# Patient Record
Sex: Female | Born: 1955 | Race: White | Hispanic: Refuse to answer | Marital: Married | Smoking: Former smoker
Health system: Southern US, Community
[De-identification: ages and names within clinical notes are randomized; demographics above are authoritative.]

## PROBLEM LIST (undated history)

## (undated) DIAGNOSIS — M1990 Unspecified osteoarthritis, unspecified site: Secondary | ICD-10-CM

## (undated) DIAGNOSIS — R7303 Prediabetes: Secondary | ICD-10-CM

## (undated) DIAGNOSIS — I10 Essential (primary) hypertension: Secondary | ICD-10-CM

## (undated) DIAGNOSIS — R519 Headache, unspecified: Secondary | ICD-10-CM

## (undated) DIAGNOSIS — L570 Actinic keratosis: Secondary | ICD-10-CM

## (undated) DIAGNOSIS — D649 Anemia, unspecified: Secondary | ICD-10-CM

## (undated) DIAGNOSIS — M1711 Unilateral primary osteoarthritis, right knee: Secondary | ICD-10-CM

## (undated) DIAGNOSIS — E785 Hyperlipidemia, unspecified: Secondary | ICD-10-CM

## (undated) HISTORY — PX: TMJ ARTHROPLASTY: SHX1066

## (undated) HISTORY — DX: Actinic keratosis: L57.0

## (undated) HISTORY — PX: COLONOSCOPY W/ BIOPSIES AND POLYPECTOMY: SHX1376

## (undated) HISTORY — PX: COLONOSCOPY: SHX174

## (undated) HISTORY — PX: TONSILLECTOMY: SUR1361

## (undated) HISTORY — PX: TUBAL LIGATION: SHX77

## (undated) HISTORY — PX: ROTATOR CUFF REPAIR: SHX139

---

## 1999-11-13 LAB — HM MAMMOGRAPHY

## 2016-02-02 LAB — HM COLONOSCOPY

## 2016-09-25 LAB — HM MAMMOGRAPHY

## 2017-08-26 ENCOUNTER — Encounter: Payer: Self-pay | Admitting: Family Medicine

## 2017-08-26 ENCOUNTER — Ambulatory Visit (INDEPENDENT_AMBULATORY_CARE_PROVIDER_SITE_OTHER): Payer: BLUE CROSS/BLUE SHIELD | Admitting: Family Medicine

## 2017-08-26 VITALS — BP 126/82 | HR 92 | Temp 98.2°F | Resp 16 | Ht 61.0 in | Wt 180.0 lb

## 2017-08-26 DIAGNOSIS — G35 Multiple sclerosis: Secondary | ICD-10-CM | POA: Insufficient documentation

## 2017-08-26 DIAGNOSIS — J309 Allergic rhinitis, unspecified: Secondary | ICD-10-CM | POA: Insufficient documentation

## 2017-08-26 DIAGNOSIS — M7711 Lateral epicondylitis, right elbow: Secondary | ICD-10-CM | POA: Diagnosis not present

## 2017-08-26 DIAGNOSIS — M1990 Unspecified osteoarthritis, unspecified site: Secondary | ICD-10-CM | POA: Insufficient documentation

## 2017-08-26 DIAGNOSIS — Z23 Encounter for immunization: Secondary | ICD-10-CM | POA: Diagnosis not present

## 2017-08-26 DIAGNOSIS — I10 Essential (primary) hypertension: Secondary | ICD-10-CM | POA: Insufficient documentation

## 2017-08-26 DIAGNOSIS — E785 Hyperlipidemia, unspecified: Secondary | ICD-10-CM | POA: Insufficient documentation

## 2017-08-26 MED ORDER — NAPROXEN 500 MG PO TABS: 500.0000 mg | ORAL_TABLET | Freq: Two times a day (BID) | ORAL | 0 refills | Status: DC

## 2017-08-26 NOTE — Patient Instructions (Signed)
Lateral epicondylitis   Tennis elbow (lateral epicondylitis) is inflammation of the outer tendons of your forearm close to your elbow. Your tendons attach your muscles to your bones. The outer tendons of your forearm are used to extend your wrist, and they attach on the outside part of your elbow. Tennis elbow is often found in people who play tennis, but anyone may get the condition from repeatedly extending the wrist or turning the forearm. What are the causes? This condition is caused by repeatedly extending your wrist and using your hands. It can result from sports or work that requires repetitive forearm movements. Tennis elbow may also be caused by an injury. What increases the risk? You have a higher risk of developing tennis elbow if you play tennis or another racquet sport. You also have a higher risk if you frequently use your hands for work. This condition is also more likely to develop in:  Musicians.  Carpenters, painters, and plumbers.  Cooks.  Cashiers.  People who work in [employer].  Architect workers.  Butchers.  People who use computers.  What are the signs or symptoms? Symptoms of this condition include:  Pain and tenderness in your forearm and the outer part of your elbow. You may only feel the pain when you use your arm, or you may feel it even when you are not using your arm.  A burning feeling that runs from your elbow through your arm.  Weak grip in your hands.  How is this diagnosed? This condition may be diagnosed by medical history and physical exam. You may also have other tests, including:  X-rays.  MRI.  How is this treated? Your health care provider will recommend lifestyle adjustments, such as resting and icing your arm. Treatment may also include:  Medicines for inflammation. This may include shots of cortisone if your pain continues.  Physical therapy. This may include massage or exercises.  An elbow brace.  Surgery may eventually  be recommended if your pain does not go away with treatment. Follow these instructions at home: Activity  Rest your elbow and wrist as directed by your health care provider. Try to avoid any activities that caused the problem until your health care provider says that you can do them again.  If a physical therapist teaches you exercises, do all of them as directed.  If you lift an object, lift it with your palm facing upward. This lowers the stress on your elbow. Lifestyle  If your tennis elbow is caused by sports, check your equipment and make sure that: ? You are using it correctly. ? It is the best fit for you.  If your tennis elbow is caused by work, take breaks frequently, if you are able. Talk with your manager about how to best perform tasks in a way that is safe. ? If your tennis elbow is caused by computer use, talk with your manager about any changes that can be made to your work environment. General instructions  If directed, apply ice to the painful area: ? Put ice in a plastic bag. ? Place a towel between your skin and the bag. ? Leave the ice on for 20 minutes, 2-3 times per day.  Take medicines only as directed by your health care provider.  If you were given a brace, wear it as directed by your health care provider.  Keep all follow-up visits as directed by your health care provider. This is important. Contact a health care provider if:  Your pain  does not get better with treatment.  Your pain gets worse.  You have numbness or weakness in your forearm, hand, or fingers. This information is not intended to replace advice given to you by your health care provider. Make sure you discuss any questions you have with your health care provider. Document Released: 10/29/2005 Document Revised: 06/28/2016 Document Reviewed: 10/25/2014 Elsevier Interactive Patient Education  [employer].

## 2017-08-26 NOTE — Progress Notes (Signed)
Patient: Stephanie Shah Female    DOB: [birth year]   61 y.o.   MRN: [id number] Visit Date: 08/26/2017  Today's Provider: Lelon Huh, MD   Chief Complaint  Patient presents with  . New Patient (Initial Visit)  . Elbow Injury   Subjective:    HPI Patient comes in today establishing care with the practice. She just moved from [place] about 2 months ago.  Elbow pain: Patient reports that she developed pain in her right elbow about 1 month ago. She thinks she may have injured it while moving and unpacking boxes. She has not taken anything OTC for her symptoms. She denies any swelling, but she does have tenderness. She has also used heat and ice to help decrease the pain with minimal relief.    Allergies  Allergen Reactions  . Penicillins Rash     Current Outpatient Prescriptions:  .  aspirin EC 81 MG tablet, Take 81 mg by mouth daily., Disp: , Rfl:  .  atorvastatin (LIPITOR) 20 MG tablet, Take 20 mg by mouth daily., Disp: , Rfl:  .  butalbital-acetaminophen-caffeine (FIORICET, ESGIC) 50-325-40 MG tablet, Take by mouth 2 (two) times daily as needed for headache., Disp: , Rfl:  .  hydrochlorothiazide (MICROZIDE) 12.5 MG capsule, Take 12.5 mg by mouth daily., Disp: , Rfl:  .  lisinopril (PRINIVIL,ZESTRIL) 40 MG tablet, Take 40 mg by mouth daily., Disp: , Rfl:  .  Multiple Vitamin (MULTIVITAMIN) tablet, Take 1 tablet by mouth daily., Disp: , Rfl:   Review of Systems  Musculoskeletal: Positive for arthralgias and myalgias. Negative for joint swelling.  Skin: Negative for color change, pallor and rash.  Neurological: Positive for weakness. Negative for numbness.  Hematological: Does not bruise/bleed easily.    Social History  Substance Use Topics  . Smoking status: Former Smoker    Types: Cigarettes    Quit date: 11/13/2003  . Smokeless tobacco: Never Used  . Alcohol use 1.8 oz/week    3 Glasses of wine per week   Objective:   BP 126/82   Pulse 92   Temp 98.2 F  (36.8 C)   Resp 16   Ht 5\' 1"  (1.549 m)   Wt 180 lb (81.6 kg)   SpO2 98%   BMI 34.01 kg/m  Vitals:   08/26/17 1510  BP: 126/82  Pulse: 92  Resp: 16  Temp: 98.2 F (36.8 C)  SpO2: 98%  Weight: 180 lb (81.6 kg)  Height: 5\' 1"  (1.549 m)     Physical Exam  General appearance: alert, well developed, well nourished, cooperative and in no distress Head: Normocephalic, without obvious abnormality, atraumatic Respiratory: Respirations even and unlabored, normal respiratory rate Extremities: Point tenderness right lateral epicondyle. Pain with with pronation and supination against resistance. No erythema, swelling, or other gross deformities.       Assessment & Plan:     1. Lateral epicondylitis of right elbow Discussed conservative treatments including relative rest, elastic forearm brace, regular icing and NSAID - naproxen (NAPROSYN) 500 MG tablet; Take 1 tablet (500 mg total) by mouth 2 (two) times daily with a meal.  Dispense: 30 tablet; Refill: 0  Discussed option of steroid injection or OT if not much improved in 1-2 weeks.   2. Influenza vaccine needed  - Flu Vaccine QUAD 6+ mos PF IM (Fluarix Quad PF)  Had patient complete forms for records release from previous PCP.       Lelon Huh, MD  [facility]

## 2017-09-12 ENCOUNTER — Other Ambulatory Visit: Payer: Self-pay | Admitting: Family Medicine

## 2017-09-12 DIAGNOSIS — M7711 Lateral epicondylitis, right elbow: Secondary | ICD-10-CM

## 2017-09-19 ENCOUNTER — Encounter: Payer: Self-pay | Admitting: Family Medicine

## 2017-09-19 DIAGNOSIS — G4486 Cervicogenic headache: Secondary | ICD-10-CM | POA: Insufficient documentation

## 2017-09-19 DIAGNOSIS — R51 Headache: Secondary | ICD-10-CM

## 2017-10-28 ENCOUNTER — Encounter: Payer: Self-pay | Admitting: Family Medicine

## 2017-10-28 ENCOUNTER — Ambulatory Visit: Payer: BLUE CROSS/BLUE SHIELD | Admitting: Family Medicine

## 2017-10-28 VITALS — BP 110/74 | HR 90 | Temp 98.1°F | Resp 16 | Wt 181.0 lb

## 2017-10-28 DIAGNOSIS — M7711 Lateral epicondylitis, right elbow: Secondary | ICD-10-CM | POA: Diagnosis not present

## 2017-10-28 NOTE — Progress Notes (Signed)
Patient: Stephanie Shah Female    DOB: September 29, 1956   61 y.o.   MRN: [id number] Visit Date: 10/28/2017  Today's Provider: Lelon Huh, MD   Chief Complaint  Patient presents with  . Follow-up   Subjective:    HPI  Lateral epicondylitis of right elbow From 08/26/2017-Discussed conservative treatments including relative rest, elastic forearm brace, regular icing and NSAID Given rx for naproxen (NAPROSYN) 500 MG tablet; Take 1 tablet (500 mg total) by mouth 2 (two) times daily with a meal.Discussed option of steroid injection or OT if not much improved in 1-2 weeks.  Symptoms started after moving at the end of the summer. Since October visit, shet states her left elbow feels is much less painful than before, but has not resolved. Still sore to touch of epicondyle and some pain when lifting objects. She did wear elbow band for a few weeks which helped somewhat.   Allergies  Allergen Reactions  . Penicillins Rash     Current Outpatient Medications:  .  aspirin EC 81 MG tablet, Take 81 mg by mouth daily., Disp: , Rfl:  .  atorvastatin (LIPITOR) 20 MG tablet, Take 20 mg by mouth daily., Disp: , Rfl:  .  butalbital-acetaminophen-caffeine (FIORICET, ESGIC) 50-325-40 MG tablet, Take by mouth 2 (two) times daily as needed for headache., Disp: , Rfl:  .  hydrochlorothiazide (MICROZIDE) 12.5 MG capsule, Take 12.5 mg by mouth daily., Disp: , Rfl:  .  lisinopril (PRINIVIL,ZESTRIL) 40 MG tablet, Take 40 mg by mouth daily., Disp: , Rfl:  .  Multiple Vitamin (MULTIVITAMIN) tablet, Take 1 tablet by mouth daily., Disp: , Rfl:   Review of Systems  Constitutional: Negative for appetite change, chills, fatigue and fever.  Respiratory: Negative for chest tightness and shortness of breath.   Cardiovascular: Negative for chest pain and palpitations.  Gastrointestinal: Negative for abdominal pain, nausea and vomiting.  Musculoskeletal: Positive for arthralgias.  Neurological: Negative for  dizziness and weakness.    Social History   Tobacco Use  . Smoking status: Former Smoker    Types: Cigarettes    Last attempt to quit: 11/13/2003    Years since quitting: 13.9  . Smokeless tobacco: Never Used  Substance Use Topics  . Alcohol use: Yes    Alcohol/week: 1.8 oz    Types: 3 Glasses of wine per week   Objective:   BP 110/74 (BP Location: Right Arm, Patient Position: Sitting, Cuff Size: Normal)   Pulse 90   Temp 98.1 F (36.7 C) (Oral)   Resp 16   Wt 181 lb (82.1 kg)   SpO2 95%   BMI 34.20 kg/m  Vitals:   10/28/17 1642  BP: 110/74  Pulse: 90  Resp: 16  Temp: 98.1 F (36.7 C)  TempSrc: Oral  SpO2: 95%  Weight: 181 lb (82.1 kg)     Physical Exam  General appearance: alert, well developed, well nourished, cooperative and in no distress Head: Normocephalic, without obvious abnormality, atraumatic Respiratory: Respirations even and unlabored, normal respiratory rate Extremities: Slight point tenderness right lateral epicondyle. Pain with with pronation and supination against resistance. No erythema, swelling, or other gross deformities.      Assessment & Plan:     1. Lateral epicondylitis of right elbow Significantly improved, but not resolved after 2 months of NSAID.  - Ambulatory referral to Occupational Therapy  The entirety of the information documented in the History of Present Illness, Review of Systems and Physical Exam were personally obtained by  me. Portions of this information were initially documented by April M. Sabra Heck, CMA and reviewed by me for thoroughness and accuracy.        Lelon Huh, MD  [facility]

## 2017-11-29 ENCOUNTER — Telehealth: Payer: Self-pay | Admitting: Family Medicine

## 2017-11-29 DIAGNOSIS — M25561 Pain in right knee: Secondary | ICD-10-CM

## 2017-11-29 NOTE — Telephone Encounter (Signed)
Ok to place ortho referral? Please advise. Thanks!

## 2017-11-29 NOTE — Telephone Encounter (Signed)
Patient recently established as pt. Here from [place] and she states her R knee is hurting again.   She had It injected in [place] by an orthopedic surgeon with [facility] and is looking to get a referral to an Ortho here that would maybe inject it again.

## 2017-12-02 NOTE — Telephone Encounter (Signed)
Ok, please order orthopedics referral for knee pain

## 2017-12-02 NOTE — Telephone Encounter (Signed)
Please schedule referral to orthopedics? Thanks!

## 2017-12-04 NOTE — Telephone Encounter (Signed)
Pt set up appointment with Vance Peper 12/12/17 at 9:00

## 2017-12-10 ENCOUNTER — Ambulatory Visit: Payer: BLUE CROSS/BLUE SHIELD | Attending: Family Medicine | Admitting: Occupational Therapy

## 2017-12-10 ENCOUNTER — Other Ambulatory Visit: Payer: Self-pay

## 2017-12-10 ENCOUNTER — Encounter: Payer: Self-pay | Admitting: Occupational Therapy

## 2017-12-10 DIAGNOSIS — M25632 Stiffness of left wrist, not elsewhere classified: Secondary | ICD-10-CM

## 2017-12-10 DIAGNOSIS — M6281 Muscle weakness (generalized): Secondary | ICD-10-CM | POA: Diagnosis present

## 2017-12-10 DIAGNOSIS — M25521 Pain in right elbow: Secondary | ICD-10-CM | POA: Insufficient documentation

## 2017-12-10 DIAGNOSIS — M7711 Lateral epicondylitis, right elbow: Secondary | ICD-10-CM | POA: Diagnosis not present

## 2017-12-10 NOTE — Therapy (Signed)
[facility] [address] [place], [address] Phone: [phone]   Fax:  [phone]  Occupational Therapy Evaluation  Patient Details  Name: Stephanie Shah MRN: [id number] Date of Birth: April 17, 1956 Referring Provider: Dr Caryn Section   Encounter Date: 12/10/2017  OT End of Session - 12/10/17 1138    Visit Number  1    Number of Visits  6    Date for OT Re-Evaluation  01/21/18    OT Start Time  1017    OT Stop Time  1114    OT Time Calculation (min)  57 min    Activity Tolerance  Patient tolerated treatment well    Behavior During Therapy  [facility] for tasks assessed/performed       History reviewed. No pertinent past medical history.  Past Surgical History:  Procedure Laterality Date  . CESAREAN SECTION    . ROTATOR CUFF REPAIR Right   . TMJ ARTHROPLASTY    . TUBAL LIGATION      There were no vitals filed for this visit.  Subjective Assessment - 12/10/17 1128    Subjective   We moved last year in Oct from [place] to [place] and packed  up  a 4 BR and 2 story house Aug and Sept - that is when is started - it is better than it was in Nov and Dec - but still pain can fluctuate between 1-4/10 -     Patient Stated Goals  I want my arm better that I can use it with no pain - like pulling and lifting     Currently in Pain?  Yes    Pain Score  --  1-4 dependending what I do     Pain Orientation  Right    Pain Descriptors / Indicators  Stabbing    Pain Onset  More than a month ago        [facility] OT Assessment - 12/10/17 0001      Assessment   Medical Diagnosis  R lat epicondylitis    Referring Provider  Dr Caryn Section    Onset Date/Surgical Date  07/31/17    Hand Dominance  Left      Precautions   Required Braces or Orthoses  -- Tried counter force brace but did not had it on correct [place] With  Spouse      Prior Function   Vocation  Retired    Leisure  Likes to travel, on computer,  read,       Palpation   Palpation comment  Tender over lat epicondyle       Strength   Right Hand Grip (lbs)  50 Extended arm 30    Left Hand Grip (lbs)  48 extended arm 40        Heat done on pt's elbow  [employer]aston tool nr 2 use for brushing and sweeping over and around lateral epicondyle Cross friction massage over lateral epincondyle  Ed on doing at home  Stretch wrist flexion and loose fist - elbow to side - in neutral Then extended arm -loose fist - in neutral  Then extended arm - loose fist- in pronation  5 reps gentle each   Ice or ice massage  Done and to do at home  2-3 x day  Pt ed on using counter force brace correctly  And modifications - pick up with palm or carry with palms up or  on forearms                 OT Education - 12/10/17 1138    Education provided  Yes    Education Details  Findings and HEP     Person(s) Educated  Patient    Methods  Explanation;Demonstration;Tactile cues;Verbal cues;Handout    Comprehension  Verbal cues required;Returned demonstration;Verbalized understanding       OT Short Term Goals - 12/10/17 1146      OT SHORT TERM GOAL #1   Title  Pt to be independent in HEP to decrease pain to less than 2/10 at the worse using at home     Baseline  tender at lateral epicondyle - and pain 4/10     Time  3    Period  Weeks    Status  New    Target Date  12/31/17        OT Long Term Goals - 12/10/17 1148      OT LONG TERM GOAL #1   Title  Pt to be negative for signs of lateral epicondyle - tenderness, pain with resistance to 3rd digit and wrist extention and increase grip with extended arm     Baseline  tenderness 4/10 and grip decrease for extended arm from 50lbs to 30 lbs     Time  4    Period  Weeks    Status  New    Target Date  01/07/18      OT LONG TERM GOAL #2   Title  Quickdash score improve with more than 20 points     Baseline  at eval score on Quickdash 40.9    Time  6    Period  Weeks    Status  New     Target Date  01/21/18            Plan - 12/10/17 1139    Clinical Impression Statement  Pt present at OT evaluation with diagnosis of Lat epicondylitis - pt still tender at lateral epicondyle - no pain with resistance to extention of wrist or 3rd digit- pt decrease grip significantly with extended arm - and tightness or stretch with  forearm extensors stretch -  pt ed on HEP - and ed on using counter force splint correctly - pain , decrease strength limiting her functional use of  R hand in ADL's and IADL's     Occupational performance deficits (Please refer to evaluation for details):  ADL's;IADL's;Play;Leisure    Rehab Potential  Good    OT Frequency  1x / week    OT Duration  6 weeks    OT Treatment/Interventions  Self-care/ADL training;Moist Heat;Splinting;Ultrasound;Iontophoresis;Cryotherapy;Manual Therapy;Passive range of motion;Therapeutic exercise    Plan  Assess progress with homeprogram and upgrade as needed     Clinical Decision Making  Several treatment options, min-mod task modification necessary    OT Home Exercise Plan  see pt instruction    Consulted and Agree with Plan of Care  Patient       Patient will benefit from skilled therapeutic intervention in order to improve the following deficits and impairments:  Pain, Impaired flexibility, Impaired UE functional use, Decreased knowledge of precautions, Decreased range of motion, Decreased strength  Visit Diagnosis: Lateral epicondylitis of right elbow - Plan: Ot plan of care cert/re-cert  Pain in right elbow - Plan: Ot plan of care cert/re-cert  Stiffness of left wrist, not elsewhere classified - Plan: Ot plan of care cert/re-cert  Muscle weakness (generalized) -  Plan: Ot plan of care cert/re-cert    Problem List Patient Active Problem List   Diagnosis Date Noted  . Cervicogenic headache 09/19/2017  . Allergic rhinitis 08/26/2017  . Arthritis 08/26/2017  . Hyperlipidemia 08/26/2017  . Hypertension 08/26/2017   . MS (multiple sclerosis) ([place]) 08/26/2017    Rosalyn Gess OTR/L,CLT 12/10/2017, 11:56 AM  [facility] [address] [place], [address] Phone: [phone]   Fax:  [phone]  Name: Creedence Heiss MRN: [id number] Date of Birth: May 08, 1956

## 2017-12-10 NOTE — Patient Instructions (Signed)
Heat  Cross friction massage over lateral epincondyle  Stretch wrist flexion and loose fist - elbow to side - in neutral Then extended arm -loose fist - in neutral  Then extended arm - loose fist- in pronation  5 reps gentle each   Ice or ice massage  Pt ed on using counter force brace correctly  And modifications - pick up with palm or carry with palms up or on forearms

## 2017-12-11 ENCOUNTER — Telehealth: Payer: Self-pay | Admitting: Family Medicine

## 2017-12-11 MED ORDER — FLUOXETINE HCL 40 MG PO CAPS: 40.0000 mg | ORAL_CAPSULE | Freq: Every day | ORAL | 3 refills | Status: DC

## 2017-12-11 NOTE — Telephone Encounter (Signed)
Patient is calling requesting a refill on the following medication  Fluoxetine 40mg , take one capsule by mouth every day. 90 day supply   She states that she moved here and was on this medication but her refills have expired.  She uses [facility].

## 2017-12-11 NOTE — Telephone Encounter (Signed)
Please review. Ok to refill?  

## 2017-12-17 ENCOUNTER — Ambulatory Visit: Payer: BLUE CROSS/BLUE SHIELD | Attending: Family Medicine | Admitting: Occupational Therapy

## 2017-12-17 DIAGNOSIS — M25632 Stiffness of left wrist, not elsewhere classified: Secondary | ICD-10-CM | POA: Insufficient documentation

## 2017-12-17 DIAGNOSIS — M25521 Pain in right elbow: Secondary | ICD-10-CM | POA: Insufficient documentation

## 2017-12-17 DIAGNOSIS — M6281 Muscle weakness (generalized): Secondary | ICD-10-CM | POA: Diagnosis present

## 2017-12-17 DIAGNOSIS — M7711 Lateral epicondylitis, right elbow: Secondary | ICD-10-CM | POA: Insufficient documentation

## 2017-12-17 NOTE — Therapy (Signed)
[facility] [address], [place], [address] Phone: [phone]   Fax:  [phone]  Occupational Therapy Treatment  Patient Details  Name: Stephanie Shah MRN: [id number] Date of Birth: 1956-05-17 Referring Provider: Dr Caryn Section   Encounter Date: 12/17/2017  OT End of Session - 12/17/17 1715    Visit Number  2    Number of Visits  6    Date for OT Re-Evaluation  01/21/18    OT Start Time  1645    OT Stop Time  1713    OT Time Calculation (min)  28 min    Activity Tolerance  Patient tolerated treatment well    Behavior During Therapy  [facility] for tasks assessed/performed       No past medical history on file.  Past Surgical History:  Procedure Laterality Date  . CESAREAN SECTION    . ROTATOR CUFF REPAIR Right   . TMJ ARTHROPLASTY    . TUBAL LIGATION      There were no vitals filed for this visit.  Subjective Assessment - 12/17/17 1656    Subjective   It is better , pain is between 0-2/10 - exercises did okay - do not feel pull - did the counter force splint with functional use     Patient Stated Goals  I want my arm better that I can use it with no pain - like pulling and lifting     Currently in Pain?  Yes         [facility] OT Assessment - 12/17/17 0001      Strength   Right Hand Grip (lbs)  52 42 extended arm     Left Hand Grip (lbs)  48 46 extender arm          Assess AROM for  wrist flexion and loose fist - elbow to side - in neutral Then extended arm -loose fist - in neutral  Then extended arm - loose fist- in pronation  5 reps gentle each   no pull  Change to       Extended arm to forearm  extensor stretch 5 reps hold 5 sec - in neutral and pronation position  Cont with heat, massage , counter  Force splint and ice massage   Heat was done at [facility]  Then [employer] nr 2 for sweeping and brushing over extensor mass and lateral epicondyle - still tender but better than last time  Review  upgrade of HEP   See above    OT Treatments/Exercises (OP) - 12/17/17 0001      Moist Heat Therapy   Number Minutes Moist Heat  5 Minutes    Moist Heat Location  Elbow prior to manual therapy      Cryotherapy   Number Minutes Cryotherapy  3 Minutes    Cryotherapy Location  -- elbow    Type of Cryotherapy  Ice massage      Reinforce for pt to still modify act how she pick up or move objects        OT Education - 12/17/17 1714    Education provided  Yes    Education Details  changes to HEP stretches    Person(s) Educated  Patient    Methods  Explanation;Demonstration;Tactile cues;Verbal cues    Comprehension  Returned demonstration       OT Short Term Goals - 12/10/17 1146      OT SHORT TERM GOAL #1   Title  Pt to be independent in HEP to decrease pain to less than 2/10 at the worse using at home     Baseline  tender at lateral epicondyle - and pain 4/10     Time  3    Period  Weeks    Status  New    Target Date  12/31/17        OT Long Term Goals - 12/10/17 1148      OT LONG TERM GOAL #1   Title  Pt to be negative for signs of lateral epicondyle - tenderness, pain with resistance to 3rd digit and wrist extention and increase grip with extended arm     Baseline  tenderness 4/10 and grip decrease for extended arm from 50lbs to 30 lbs     Time  4    Period  Weeks    Status  New    Target Date  01/07/18      OT LONG TERM GOAL #2   Title  Quickdash score improve with more than 20 points     Baseline  at eval score on Quickdash 40.9    Time  6    Period  Weeks    Status  New    Target Date  01/21/18            Plan - 12/17/17 1715    Clinical Impression Statement  Pt this date report pain decrease from 4/10 at the worse to 2/10 - did wear the counter force splint - and less tender - but still - change HEP to stretch - pt did show increase grip strength in R hand with extended arm by 12 lbs - pt to cont with HEP this time 2 wks before returning      Occupational performance deficits (Please refer to evaluation for details):  ADL's;IADL's;Play;Leisure    Rehab Potential  Good    OT Frequency  Biweekly    OT Duration  4 weeks    OT Treatment/Interventions  Self-care/ADL training;Moist Heat;Splinting;Ultrasound;Iontophoresis;Cryotherapy;Manual Therapy;Passive range of motion;Therapeutic exercise    Plan  assess progress with HEP and upgrade HEP as needed     Clinical Decision Making  Several treatment options, min-mod task modification necessary    OT Home Exercise Plan  see pt instruction    Consulted and Agree with Plan of Care  Patient       Patient will benefit from skilled therapeutic intervention in order to improve the following deficits and impairments:  Pain, Impaired flexibility, Impaired UE functional use, Decreased knowledge of precautions, Decreased range of motion, Decreased strength  Visit Diagnosis: Lateral epicondylitis of right elbow  Pain in right elbow  Stiffness of left wrist, not elsewhere classified  Muscle weakness (generalized)    Problem List Patient Active Problem List   Diagnosis Date Noted  . Cervicogenic headache 09/19/2017  . Allergic rhinitis 08/26/2017  . Arthritis 08/26/2017  . Hyperlipidemia 08/26/2017  . Hypertension 08/26/2017  . MS (multiple sclerosis) ([place]) 08/26/2017    Rosalyn Gess OTR/L,CLT 12/17/2017, 5:17 PM  [place] [facility] [address], [place], [address] Phone: [phone]   Fax:  [phone]  Name: Shelly Spenser MRN: [id number] Date of Birth: [birth year]

## 2017-12-17 NOTE — Patient Instructions (Signed)
Change her  Extended arm to forearm  extensor stretch 5 reps hold 5 sec - in neutral and pronation position  Cont with heat, massage , counter  Force splint and ice massage

## 2017-12-31 ENCOUNTER — Ambulatory Visit: Payer: BLUE CROSS/BLUE SHIELD | Admitting: Occupational Therapy

## 2017-12-31 DIAGNOSIS — M7711 Lateral epicondylitis, right elbow: Secondary | ICD-10-CM

## 2017-12-31 DIAGNOSIS — M6281 Muscle weakness (generalized): Secondary | ICD-10-CM

## 2017-12-31 DIAGNOSIS — M25632 Stiffness of left wrist, not elsewhere classified: Secondary | ICD-10-CM

## 2017-12-31 DIAGNOSIS — M25521 Pain in right elbow: Secondary | ICD-10-CM

## 2017-12-31 NOTE — Therapy (Signed)
[facility] [address] [place], [address] Phone: [phone]   Fax:  [phone]  Occupational Therapy Treatment  Patient Details  Name: Stephanie Shah MRN: [id number] Date of Birth: 08/27/1956 Referring Provider: Dr Caryn Section   Encounter Date: 12/31/2017  OT End of Session - 12/31/17 1359    Visit Number  3    Number of Visits  6    Date for OT Re-Evaluation  01/21/18    OT Start Time  1130    OT Stop Time  1200    OT Time Calculation (min)  30 min    Activity Tolerance  Patient tolerated treatment well    Behavior During Therapy  [facility] for tasks assessed/performed       No past medical history on file.  Past Surgical History:  Procedure Laterality Date  . CESAREAN SECTION    . ROTATOR CUFF REPAIR Right   . TMJ ARTHROPLASTY    . TUBAL LIGATION      There were no vitals filed for this visit.  Subjective Assessment - 12/31/17 1133    Subjective   Much better and can go day or 2 without even thinking of my arm - I even forgot to do my execises because it do not hurt and even cannot find tender spot at times     Patient Stated Goals  I want my arm better that I can use it with no pain - like pulling and lifting     Currently in Pain?  No/denies         [facility] OT Assessment - 12/31/17 0001      Strength   Right Hand Grip (lbs)  -- 50 extended arm     Left Hand Grip (lbs)  -- extended arm 53         Assess AROM for  wrist flexion and extention with loose fist - elbow to side and in extention   no tightness and ROM WNL Wrist strength in all planes and elbow in all planes 5/5 - and no pain    Graston tool nr 2 for sweeping and brushing over extensor mass and lateral epicondyle - still tender but less than 1/10  And just over lat epicondyle  Pt to cont with HEP for few wks and gradually stop   not tightness over tenderness over extensor mass of R forearm  Review HEP for possible dicharge and   pt  to cont with modifications to decrease risk for developing it again- 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                OT Education - 12/31/17 1358    Education provided  Yes    Education Details  discharge instructions    Person(s) Educated  Patient    Methods  Explanation;Demonstration    Comprehension  Returned demonstration       OT Short Term Goals - 12/31/17 1402      OT SHORT TERM GOAL #1   Title  Pt to be independent in HEP to decrease pain to less than 2/10 at the worse using at home     Baseline  no pain and independent in HEP     Status  Achieved        OT Long Term Goals - 12/31/17 1402      OT LONG TERM GOAL #1   Title  Pt to be negative for signs  of lateral epicondyle - tenderness, pain with resistance to 3rd digit and wrist extention and increase grip with extended arm     Baseline  Grip even with extended arm and WNL - no pain     Status  Achieved      OT LONG TERM GOAL #2   Title  Quickdash score improve with more than 20 points     Baseline  at eval score on Quickdash 40.9 and now 0     Status  Achieved            Plan - 12/31/17 1359    Clinical Impression Statement  Pt showed great progress in pain, ROM , and strength in R elbow  - report did not had any pain the last week or 2 - grip strength and grip with extended arm is even about and no pain -  AROM WNL - and  negative for pain with resistance to 3rd digit and wrist extnetion - pt  can cont wtih HEP and phone if need me otherwise will discharge her     Occupational performance deficits (Please refer to evaluation for details):  ADL's;IADL's;Play;Leisure    OT Treatment/Interventions  Self-care/ADL training;Moist Heat;Splinting;Ultrasound;Iontophoresis;Cryotherapy;Manual Therapy;Passive range of motion;Therapeutic exercise    Plan  cont with HEP if no symptoms in 2-3 wks - will discharge her if she do not phone me     Clinical Decision Making  Limited treatment  options, no task modification necessary    OT Home Exercise Plan  see pt instruction    Consulted and Agree with Plan of Care  Patient       Patient will benefit from skilled therapeutic intervention in order to improve the following deficits and impairments:  Pain, Impaired flexibility, Impaired UE functional use, Decreased knowledge of precautions, Decreased range of motion, Decreased strength  Visit Diagnosis: Lateral epicondylitis of right elbow  Pain in right elbow  Stiffness of left wrist, not elsewhere classified  Muscle weakness (generalized)    Problem List Patient Active Problem List   Diagnosis Date Noted  . Cervicogenic headache 09/19/2017  . Allergic rhinitis 08/26/2017  . Arthritis 08/26/2017  . Hyperlipidemia 08/26/2017  . Hypertension 08/26/2017  . MS (multiple sclerosis) ([place]) 08/26/2017    Rosalyn Gess OTR/L,CLT 12/31/2017, 2:03 PM  [facility] [address], [place], [address] Phone: [phone]   Fax:  [phone]  Name: Stephanie Shah MRN: [id number] Date of Birth: 02-22-1956

## 2017-12-31 NOTE — Patient Instructions (Signed)
Can cont with same HEP few more wks and gradually stop  And cont to modify act to decrease risk to develop symptoms

## 2018-01-14 ENCOUNTER — Other Ambulatory Visit: Payer: Self-pay | Admitting: Family Medicine

## 2018-02-19 ENCOUNTER — Telehealth: Payer: Self-pay | Admitting: Family Medicine

## 2018-02-19 NOTE — Telephone Encounter (Signed)
Faxed ROI to [employer] on 12.14.18

## 2018-02-24 ENCOUNTER — Ambulatory Visit: Payer: BLUE CROSS/BLUE SHIELD | Admitting: Family Medicine

## 2018-02-24 ENCOUNTER — Encounter: Payer: Self-pay | Admitting: Family Medicine

## 2018-02-24 VITALS — BP 130/84 | HR 84 | Temp 98.0°F | Resp 16 | Ht 61.0 in | Wt 177.0 lb

## 2018-02-24 DIAGNOSIS — E785 Hyperlipidemia, unspecified: Secondary | ICD-10-CM | POA: Diagnosis not present

## 2018-02-24 DIAGNOSIS — I10 Essential (primary) hypertension: Secondary | ICD-10-CM | POA: Diagnosis not present

## 2018-02-24 DIAGNOSIS — Z6833 Body mass index (BMI) 33.0-33.9, adult: Secondary | ICD-10-CM

## 2018-02-24 NOTE — Progress Notes (Signed)
       Patient: Stephanie Shah Female    DOB: [birth year]   62 y.o.   MRN: [id number] Visit Date: 02/24/2018  Today's Provider: Lelon Huh, MD    Subjective:    HPI  Patient states she has an upcoming nuclear stress test scheduled with Dr. Saralyn Pilar. Patient wanted to get her labs done here before getting the stress test done.  She has long history of hypertension and hyperlipidemia and reports she has been taking current medications for several yeas with no adverse effects.    Allergies  Allergen Reactions  . Penicillins Rash     Current Outpatient Medications:  .  aspirin EC 81 MG tablet, Take 81 mg by mouth daily., Disp: , Rfl:  .  atorvastatin (LIPITOR) 20 MG tablet, TAKE 1 TABLET BY MOUTH EVERY DAY, Disp: 90 tablet, Rfl: 2 .  butalbital-acetaminophen-caffeine (FIORICET, ESGIC) 50-325-40 MG tablet, Take by mouth 2 (two) times daily as needed for headache., Disp: , Rfl:  .  FLUoxetine (PROZAC) 40 MG capsule, Take 1 capsule (40 mg total) by mouth daily., Disp: 90 capsule, Rfl: 3 .  hydrochlorothiazide (MICROZIDE) 12.5 MG capsule, Take 12.5 mg by mouth daily., Disp: , Rfl:  .  lisinopril (PRINIVIL,ZESTRIL) 40 MG tablet, Take 40 mg by mouth daily., Disp: , Rfl:  .  Multiple Vitamin (MULTIVITAMIN) tablet, Take 1 tablet by mouth daily., Disp: , Rfl:   Review of Systems  Constitutional: Negative for appetite change, chills, fatigue and fever.  Respiratory: Negative for chest tightness and shortness of breath.   Cardiovascular: Negative for chest pain and palpitations.  Gastrointestinal: Negative for abdominal pain, nausea and vomiting.  Neurological: Negative for dizziness and weakness.    Social History   Tobacco Use  . Smoking status: Former Smoker    Types: Cigarettes    Last attempt to quit: 11/13/2003    Years since quitting: 14.2  . Smokeless tobacco: Never Used  Substance Use Topics  . Alcohol use: Yes    Alcohol/week: 1.8 oz    Types: 3 Glasses of wine per week    Objective:   BP 130/84 (BP Location: Right Arm, Patient Position: Sitting, Cuff Size: Large)   Pulse 84   Temp 98 F (36.7 C) (Oral)   Resp 16   Ht 5\' 1"  (1.549 m)   Wt 177 lb (80.3 kg)   SpO2 96%   BMI 33.44 kg/m     Physical Exam  General appearance: alert, well developed, well nourished, cooperative and in no distress Head: Normocephalic, without obvious abnormality, atraumatic Respiratory: Respirations even and unlabored, normal respiratory rate Psych: Appropriate mood and affect. Neurologic: Mental status: Alert, oriented to person, place, and time, thought content appropriate.     Assessment & Plan:     1. Essential hypertension   2. Hyperlipidemia, unspecified hyperlipidemia type  - Comprehensive metabolic panel - Lipid panel - CBC - TSH       Lelon Huh, MD  [facility]

## 2018-02-24 NOTE — Patient Instructions (Signed)
   The CDC recommends two doses of Shingrix (the shingles vaccine) separated by 2 to 6 months for adults age 61 years and older. I recommend checking with your insurance plan regarding coverage for this vaccine.   

## 2018-02-25 LAB — COMPREHENSIVE METABOLIC PANEL
A/G RATIO: 1.6 (ref 1.2–2.2)
ALK PHOS: 74 IU/L (ref 39–117)
ALT: 31 IU/L (ref 0–32)
AST: 28 IU/L (ref 0–40)
Albumin: 4.4 g/dL (ref 3.6–4.8)
BILIRUBIN TOTAL: 0.4 mg/dL (ref 0.0–1.2)
BUN/Creatinine Ratio: 23 (ref 12–28)
BUN: 13 mg/dL (ref 8–27)
CHLORIDE: 97 mmol/L (ref 96–106)
CO2: 25 mmol/L (ref 20–29)
Calcium: 9.8 mg/dL (ref 8.7–10.3)
Creatinine, Ser: 0.57 mg/dL (ref 0.57–1.00)
GFR calc Af Amer: 116 mL/min/{1.73_m2} (ref >59)
GFR calc non Af Amer: 100 mL/min/{1.73_m2} (ref >59)
GLOBULIN, TOTAL: 2.7 g/dL (ref 1.5–4.5)
Glucose: 88 mg/dL (ref 65–99)
POTASSIUM: 4 mmol/L (ref 3.5–5.2)
SODIUM: 136 mmol/L (ref 134–144)
Total Protein: 7.1 g/dL (ref 6.0–8.5)

## 2018-02-25 LAB — CBC
HEMATOCRIT: 41.9 % (ref 34.0–46.6)
HEMOGLOBIN: 13.5 g/dL (ref 11.1–15.9)
MCH: 30.4 pg (ref 26.6–33.0)
MCHC: 32.2 g/dL (ref 31.5–35.7)
MCV: 94 fL (ref 79–97)
Platelets: 367 10*3/uL (ref 150–379)
RBC: 4.44 x10E6/uL (ref 3.77–5.28)
RDW: 14 % (ref 12.3–15.4)
WBC: 8 10*3/uL (ref 3.4–10.8)

## 2018-02-25 LAB — LIPID PANEL
CHOL/HDL RATIO: 2.5 ratio (ref 0.0–4.4)
Cholesterol, Total: 156 mg/dL (ref 100–199)
HDL: 63 mg/dL (ref >39)
LDL Calculated: 80 mg/dL (ref 0–99)
TRIGLYCERIDES: 67 mg/dL (ref 0–149)
VLDL Cholesterol Cal: 13 mg/dL (ref 5–40)

## 2018-02-25 LAB — TSH: TSH: 1.23 u[IU]/mL (ref 0.450–4.500)

## 2018-02-27 ENCOUNTER — Other Ambulatory Visit: Payer: Self-pay | Admitting: Family Medicine

## 2018-02-27 DIAGNOSIS — Z1231 Encounter for screening mammogram for malignant neoplasm of breast: Secondary | ICD-10-CM

## 2018-03-03 ENCOUNTER — Encounter: Payer: Self-pay | Admitting: Family Medicine

## 2018-03-03 ENCOUNTER — Ambulatory Visit (INDEPENDENT_AMBULATORY_CARE_PROVIDER_SITE_OTHER): Payer: BLUE CROSS/BLUE SHIELD | Admitting: Family Medicine

## 2018-03-03 VITALS — BP 124/76 | HR 88 | Temp 97.3°F | Resp 16 | Ht 61.0 in | Wt 177.0 lb

## 2018-03-03 DIAGNOSIS — Z0000 Encounter for general adult medical examination without abnormal findings: Secondary | ICD-10-CM

## 2018-03-03 DIAGNOSIS — N889 Noninflammatory disorder of cervix uteri, unspecified: Secondary | ICD-10-CM | POA: Insufficient documentation

## 2018-03-03 DIAGNOSIS — Z1159 Encounter for screening for other viral diseases: Secondary | ICD-10-CM

## 2018-03-03 DIAGNOSIS — Z114 Encounter for screening for human immunodeficiency virus [HIV]: Secondary | ICD-10-CM | POA: Diagnosis not present

## 2018-03-03 NOTE — Progress Notes (Signed)
Patient: Stephanie Shah, Female    DOB: 1956/08/06, 62 y.o.   MRN: [id number] Visit Date: 03/03/2018  Today's Provider: Lavon Paganini, MD   I, Martha Clan, CMA, am acting as scribe for Lavon Paganini, MD.  Chief Complaint  Patient presents with  . Annual Exam   Subjective:    Annual physical exam Stephanie Shah is a 62 y.o. female who presents today for health maintenance and complete physical. She feels well. She reports exercising none. She reports she is sleeping well.  Pt established care with Dr. Caryn Section in October, 2018. She has a mammogram scheduled for tomorrow. She states her last pap was in November of 2017 at her previous PCP in [place]. No H/O abnormal paps per pt.  Despite this, she thinks she needed pap smear in Nov 2018. States her last colonoscopy was in the last 10 years, and was normal. She declines Tdap today  Had blood work done last week.  Reviewed lipid panel, CBC, CMP, TSH. Patient is due for Hep C and HIV screening.  -----------------------------------------------------------------   Review of Systems  Constitutional: Negative.   HENT: Positive for rhinorrhea and sinus pressure. Negative for congestion, dental problem, drooling, ear discharge, ear pain, facial swelling, hearing loss, mouth sores, nosebleeds, postnasal drip, sinus pain, sneezing, sore throat, tinnitus, trouble swallowing and voice change.   Eyes: Negative.   Respiratory: Negative.   Cardiovascular: Negative.   Gastrointestinal: Negative.   Endocrine: Negative.   Genitourinary: Negative.   Musculoskeletal: Negative.   Skin: Negative.   Allergic/Immunologic: Positive for environmental allergies. Negative for food allergies and immunocompromised state.  Neurological: Positive for headaches. Negative for dizziness, tremors, seizures, syncope, facial asymmetry, speech difficulty, weakness, light-headedness and numbness.  Hematological: Negative.   Psychiatric/Behavioral:  Negative.     Social History      She  reports that she quit smoking about 14 years ago. Her smoking use included cigarettes. She has never used smokeless tobacco. She reports that she drinks about 1.8 oz of alcohol per week. She reports that she does not use drugs.       Social History   Socioeconomic History  . Marital status: Married    Spouse name: Not on file  . Number of children: Not on file  . Years of education: Not on file  . Highest education level: Not on file  Occupational History  . Not on file  Social Needs  . Financial resource strain: Not on file  . Food insecurity:    Worry: Not on file    Inability: Not on file  . Transportation needs:    Medical: Not on file    Non-medical: Not on file  Tobacco Use  . Smoking status: Former Smoker    Types: Cigarettes    Last attempt to quit: 11/13/2003    Years since quitting: 14.3  . Smokeless tobacco: Never Used  Substance and Sexual Activity  . Alcohol use: Yes    Alcohol/week: 1.8 oz    Types: 3 Glasses of wine per week  . Drug use: No  . Sexual activity: Not on file  Lifestyle  . Physical activity:    Days per week: Not on file    Minutes per session: Not on file  . Stress: Not on file  Relationships  . Social connections:    Talks on phone: Not on file    Gets together: Not on file    Attends religious service: Not on file    Active  member of club or organization: Not on file    Attends meetings of clubs or organizations: Not on file    Relationship status: Not on file  Other Topics Concern  . Not on file  Social History Narrative   Moved from [place] back to [place] Sept, 2018. Daughter in-law of Dublin Cantero    History reviewed. No pertinent past medical history.   Patient Active Problem List   Diagnosis Date Noted  . Cervicogenic headache 09/19/2017  . Allergic rhinitis 08/26/2017  . Arthritis 08/26/2017  . Hyperlipidemia 08/26/2017  . Hypertension 08/26/2017  . MS (multiple  sclerosis) ([place]) 08/26/2017    Past Surgical History:  Procedure Laterality Date  . CESAREAN SECTION    . ROTATOR CUFF REPAIR Right   . TMJ ARTHROPLASTY    . TUBAL LIGATION      Family History        Family Status  Relation Name Status  . Mother  (Not Specified)  . Father  (Not Specified)  . MGF  (Not Specified)  . PGM  (Not Specified)        Her family history includes Cancer in her mother and paternal grandmother; Dementia in her paternal grandmother; Heart attack in her father; Heart disease in her maternal grandfather; Hypertension in her mother.      Allergies  Allergen Reactions  . Penicillins Rash     Current Outpatient Medications:  .  aspirin EC 81 MG tablet, Take 81 mg by mouth daily., Disp: , Rfl:  .  atorvastatin (LIPITOR) 20 MG tablet, TAKE 1 TABLET BY MOUTH EVERY DAY, Disp: 90 tablet, Rfl: 2 .  Biotin 1000 MCG tablet, Take 1,000 mcg by mouth 3 (three) times daily., Disp: , Rfl:  .  butalbital-acetaminophen-caffeine (FIORICET, ESGIC) 50-325-40 MG tablet, Take by mouth 2 (two) times daily as needed for headache., Disp: , Rfl:  .  FLUoxetine (PROZAC) 40 MG capsule, Take 1 capsule (40 mg total) by mouth daily., Disp: 90 capsule, Rfl: 3 .  hydrochlorothiazide (MICROZIDE) 12.5 MG capsule, Take 12.5 mg by mouth daily., Disp: , Rfl:  .  lisinopril (PRINIVIL,ZESTRIL) 40 MG tablet, Take 40 mg by mouth daily., Disp: , Rfl:  .  Multiple Vitamin (MULTIVITAMIN) tablet, Take 1 tablet by mouth daily., Disp: , Rfl:    Patient Care Team: Birdie Sons, MD as PCP - General (Family Medicine)      Objective:   Vitals: BP 124/76 (BP Location: Left Arm, Patient Position: Sitting, Cuff Size: Large)   Pulse 88   Temp (!) 97.3 F (36.3 C) (Oral)   Resp 16   Ht 5\' 1"  (1.549 m)   Wt 177 lb (80.3 kg)   SpO2 98%   BMI 33.44 kg/m    Vitals:   03/03/18 1512  BP: 124/76  Pulse: 88  Resp: 16  Temp: (!) 97.3 F (36.3 C)  TempSrc: Oral  SpO2: 98%  Weight: 177 lb (80.3  kg)  Height: 5\' 1"  (1.549 m)     Physical Exam  Constitutional: She is oriented to person, place, and time. She appears well-developed and well-nourished. No distress.  HENT:  Head: Normocephalic and atraumatic.  Right Ear: External ear normal.  Left Ear: External ear normal.  Nose: Nose normal.  Mouth/Throat: Oropharynx is clear and moist.  Eyes: Pupils are equal, round, and reactive to light. Conjunctivae and EOM are normal. No scleral icterus.  Neck: Neck supple. No thyromegaly present.  Cardiovascular: Normal rate, regular rhythm, normal heart sounds and  intact distal pulses.  No murmur heard. Pulmonary/Chest: Breath sounds normal. No respiratory distress. She has no wheezes. She has no rales.  Abdominal: Soft. Bowel sounds are normal. She exhibits no distension. There is no tenderness. There is no rebound and no guarding.  Genitourinary:  Genitourinary Comments: GYN:  External genitalia within normal limits.  Vaginal mucosa pink, moist, normal rugae.  Nonfriable cervix without lesion of 5 o'clock position, no discharge or bleeding noted on speculum exam.  Bimanual exam revealed normal, nongravid uterus.  No cervical motion tenderness. No adnexal masses bilaterally.    Musculoskeletal: She exhibits no edema or deformity.  Lymphadenopathy:    She has no cervical adenopathy.  Neurological: She is alert and oriented to person, place, and time.  Skin: Skin is warm and dry. Capillary refill takes less than 2 seconds. No rash noted.  Psychiatric: She has a normal mood and affect. Her behavior is normal.  Vitals reviewed.    Depression Screen PHQ 2/9 Scores 03/03/2018 08/26/2017  PHQ - 2 Score 0 0     Assessment & Plan:     Routine Health Maintenance and Physical Exam  Exercise Activities and Dietary recommendations Goals    None      Immunization History  Administered Date(s) Administered  . Influenza,inj,Quad PF,6+ Mos 08/26/2017    Health Maintenance  Topic Date  Due  . Hepatitis C Screening  1956/09/10  . HIV Screening  12/20/1971  . TETANUS/TDAP  12/20/1975  . PAP SMEAR  12/19/1977  . MAMMOGRAM  12/19/2006  . COLONOSCOPY  12/19/2006  . INFLUENZA VACCINE  06/12/2018     Discussed health benefits of physical activity, and encouraged her to engage in regular exercise appropriate for her age and condition.    We will also request records from previous gastroenterologist for last colonoscopy -------------------------------------------------------------------- Problem List Items Addressed This Visit      Other   Lesion of cervix    Despite history of all normal Pap smears, with finding of lesion of the cervix noted on exam today, will refer to gynecology for possible biopsy and further evaluation Pap smear was collected today with HPV co-testing Will request records from previous gynecologist to see if this was present previously      Relevant Orders   Ambulatory referral to Gynecology   Pap IG and HPV (high risk) DNA detection    Other Visit Diagnoses    Encounter for annual physical exam    -  Primary   Need for hepatitis C screening test       Relevant Orders   Hepatitis C Antibody   Encounter for screening for HIV       Relevant Orders   HIV antibody (with reflex)       Return in about 6 months (around 09/02/2018) for chronic disease f/u with PCP.   The entirety of the information documented in the History of Present Illness, Review of Systems and Physical Exam were personally obtained by me. Portions of this information were initially documented by Raquel Sarna Ratchford, CMA and reviewed by me for thoroughness and accuracy.    Virginia Crews, MD, MPH [facility] 03/03/2018 4:59 PM

## 2018-03-03 NOTE — Assessment & Plan Note (Signed)
Despite history of all normal Pap smears, with finding of lesion of the cervix noted on exam today, will refer to gynecology for possible biopsy and further evaluation Pap smear was collected today with HPV co-testing Will request records from previous gynecologist to see if this was present previously

## 2018-03-03 NOTE — Patient Instructions (Signed)
Preventive Care 40-64 Years, Female Preventive care refers to lifestyle choices and visits with your health care provider that can promote health and wellness. What does preventive care include?  A yearly physical exam. This is also called an annual well check.  Dental exams once or twice a year.  Routine eye exams. Ask your health care provider how often you should have your eyes checked.  Personal lifestyle choices, including: ? Daily care of your teeth and gums. ? Regular physical activity. ? Eating a healthy diet. ? Avoiding tobacco and drug use. ? Limiting alcohol use. ? Practicing safe sex. ? Taking low-dose aspirin daily starting at age 58. ? Taking vitamin and mineral supplements as recommended by your health care provider. What happens during an annual well check? The services and screenings done by your health care provider during your annual well check will depend on your age, overall health, lifestyle risk factors, and family history of disease. Counseling Your health care provider may ask you questions about your:  Alcohol use.  Tobacco use.  Drug use.  Emotional well-being.  Home and relationship well-being.  Sexual activity.  Eating habits.  Work and work Statistician.  Method of birth control.  Menstrual cycle.  Pregnancy history.  Screening You may have the following tests or measurements:  Height, weight, and BMI.  Blood pressure.  Lipid and cholesterol levels. These may be checked every 5 years, or more frequently if you are over 81 years old.  Skin check.  Lung cancer screening. You may have this screening every year starting at age 78 if you have a 30-pack-year history of smoking and currently smoke or have quit within the past 15 years.  Fecal occult blood test (FOBT) of the stool. You may have this test every year starting at age 65.  Flexible sigmoidoscopy or colonoscopy. You may have a sigmoidoscopy every 5 years or a colonoscopy  every 10 years starting at age 30.  Hepatitis C blood test.  Hepatitis B blood test.  Sexually transmitted disease (STD) testing.  Diabetes screening. This is done by checking your blood sugar (glucose) after you have not eaten for a while (fasting). You may have this done every 1-3 years.  Mammogram. This may be done every 1-2 years. Talk to your health care provider about when you should start having regular mammograms. This may depend on whether you have a family history of breast cancer.  BRCA-related cancer screening. This may be done if you have a family history of breast, ovarian, tubal, or peritoneal cancers.  Pelvic exam and Pap test. This may be done every 3 years starting at age 80. Starting at age 36, this may be done every 5 years if you have a Pap test in combination with an HPV test.  Bone density scan. This is done to screen for osteoporosis. You may have this scan if you are at high risk for osteoporosis.  Discuss your test results, treatment options, and if necessary, the need for more tests with your health care provider. Vaccines Your health care provider may recommend certain vaccines, such as:  Influenza vaccine. This is recommended every year.  Tetanus, diphtheria, and acellular pertussis (Tdap, Td) vaccine. You may need a Td booster every 10 years.  Varicella vaccine. You may need this if you have not been vaccinated.  Zoster vaccine. You may need this after age 5.  Measles, mumps, and rubella (MMR) vaccine. You may need at least one dose of MMR if you were born in  1956 or later. You may also need a second dose.  Pneumococcal 13-valent conjugate (PCV13) vaccine. You may need this if you have certain conditions and were not previously vaccinated.  Pneumococcal polysaccharide (PPSV23) vaccine. You may need one or two doses if you smoke cigarettes or if you have certain conditions.  Meningococcal vaccine. You may need this if you have certain  conditions.  Hepatitis A vaccine. You may need this if you have certain conditions or if you travel or work in places where you may be exposed to hepatitis A.  Hepatitis B vaccine. You may need this if you have certain conditions or if you travel or work in places where you may be exposed to hepatitis B.  Haemophilus influenzae type b (Hib) vaccine. You may need this if you have certain conditions.  Talk to your health care provider about which screenings and vaccines you need and how often you need them. This information is not intended to replace advice given to you by your health care provider. Make sure you discuss any questions you have with your health care provider. Document Released: 11/25/2015 Document Revised: 07/18/2016 Document Reviewed: 08/30/2015 Elsevier Interactive Patient Education  2018 Elsevier Inc.  

## 2018-03-04 ENCOUNTER — Ambulatory Visit
Admission: RE | Admit: 2018-03-04 | Discharge: 2018-03-04 | Disposition: A | Discharge status: Home / Self Care | Payer: BLUE CROSS/BLUE SHIELD | Source: Ambulatory Visit | Attending: Family Medicine | Admitting: Family Medicine

## 2018-03-04 ENCOUNTER — Other Ambulatory Visit: Payer: Self-pay | Admitting: Family Medicine

## 2018-03-04 ENCOUNTER — Encounter: Payer: Self-pay | Admitting: Family Medicine

## 2018-03-04 ENCOUNTER — Telehealth: Payer: Self-pay

## 2018-03-04 DIAGNOSIS — Z1231 Encounter for screening mammogram for malignant neoplasm of breast: Secondary | ICD-10-CM | POA: Diagnosis not present

## 2018-03-04 LAB — HIV ANTIBODY (ROUTINE TESTING W REFLEX): HIV Screen 4th Generation wRfx: NONREACTIVE

## 2018-03-04 LAB — HEPATITIS C ANTIBODY: Hep C Virus Ab: <0.1 s/co ratio (ref 0.0–0.9)

## 2018-03-04 IMAGING — MG MM DIGITAL SCREENING BILAT W/ TOMO W/ CAD
8 series · 8 of 24 positions shown · non-contrast
Comparison: Previous exam(s).

CLINICAL DATA: Screening.

EXAM:
DIGITAL SCREENING BILATERAL MAMMOGRAM WITH TOMO AND CAD

[L CC synth-2D]
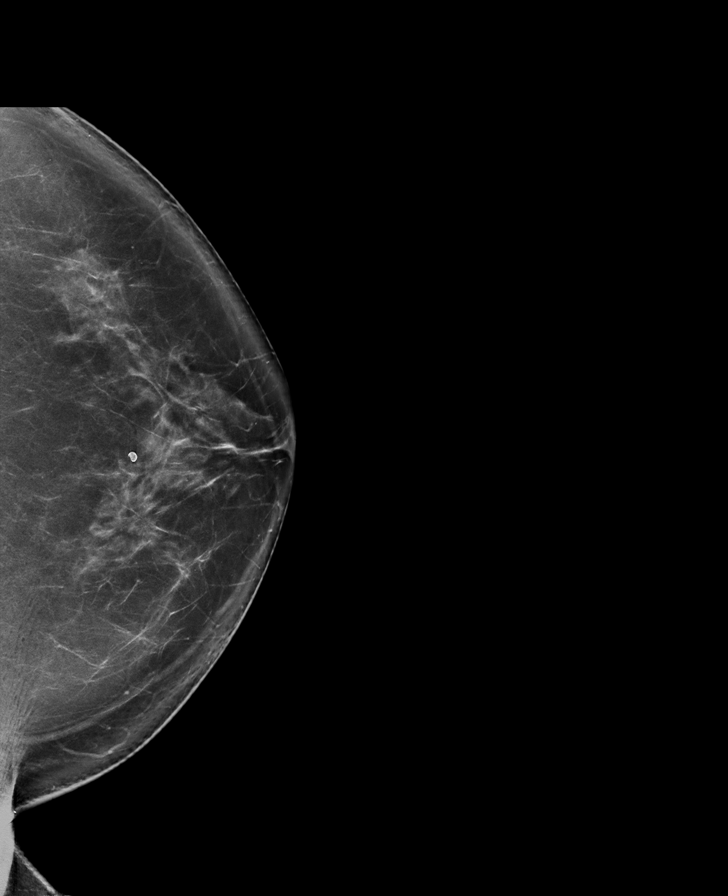

[L MLO synth-2D]
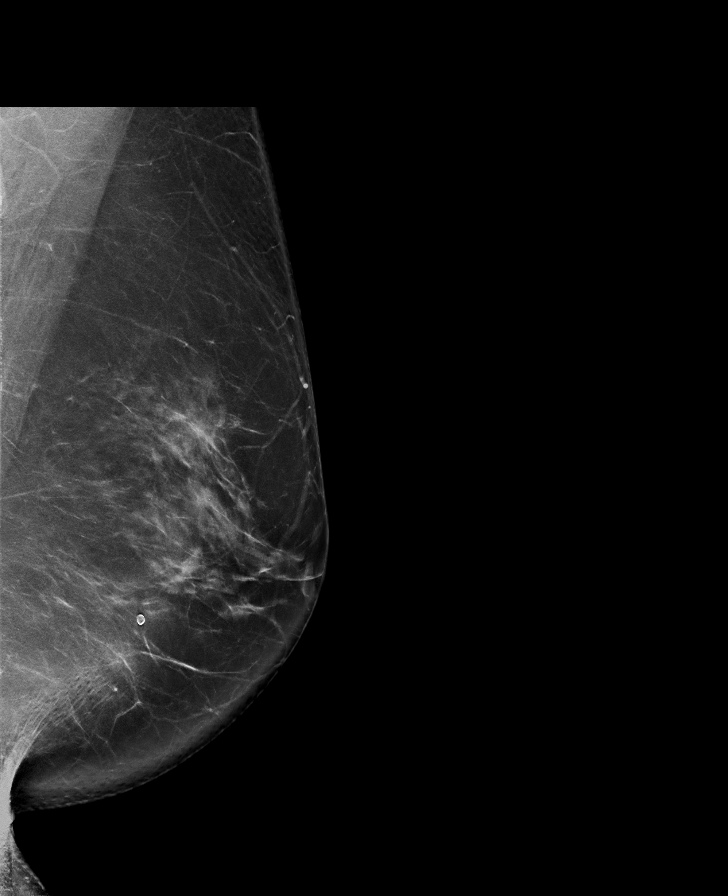

[R MLO synth-2D]
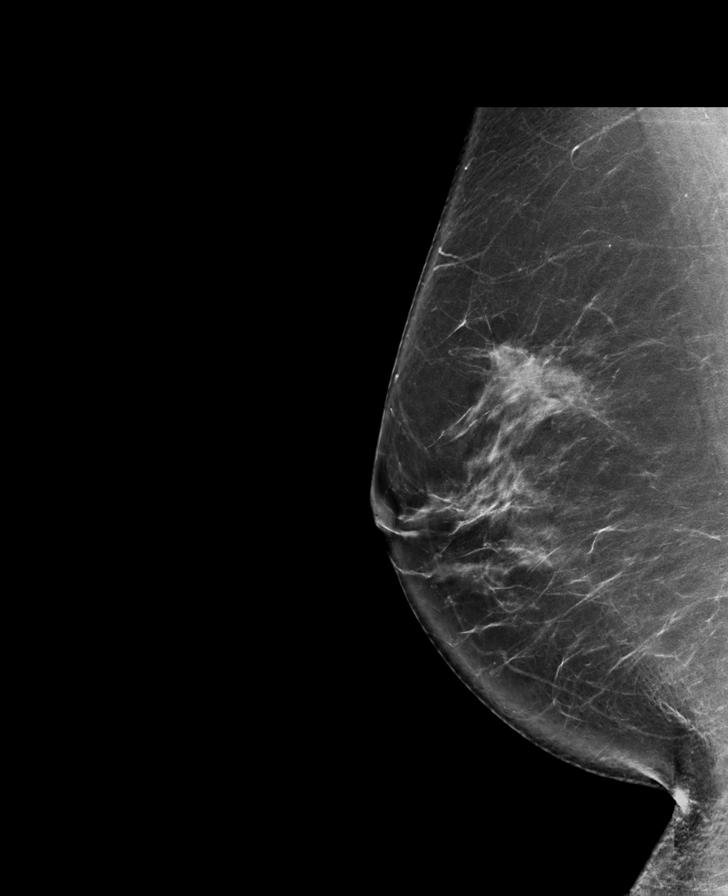

[R CC synth-2D]
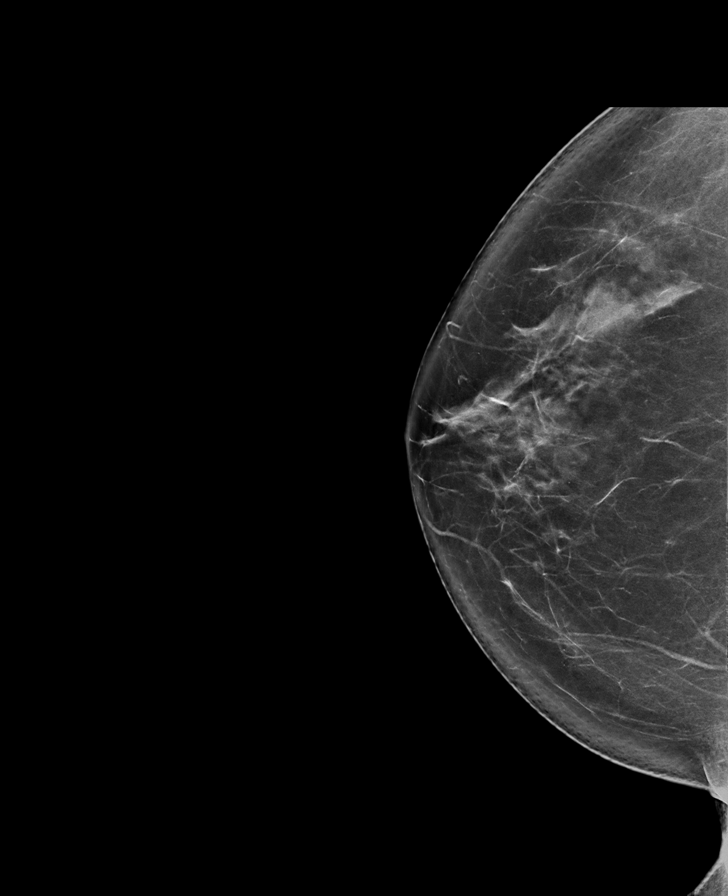

[R CC tomo · tomo slice 43/86.0]
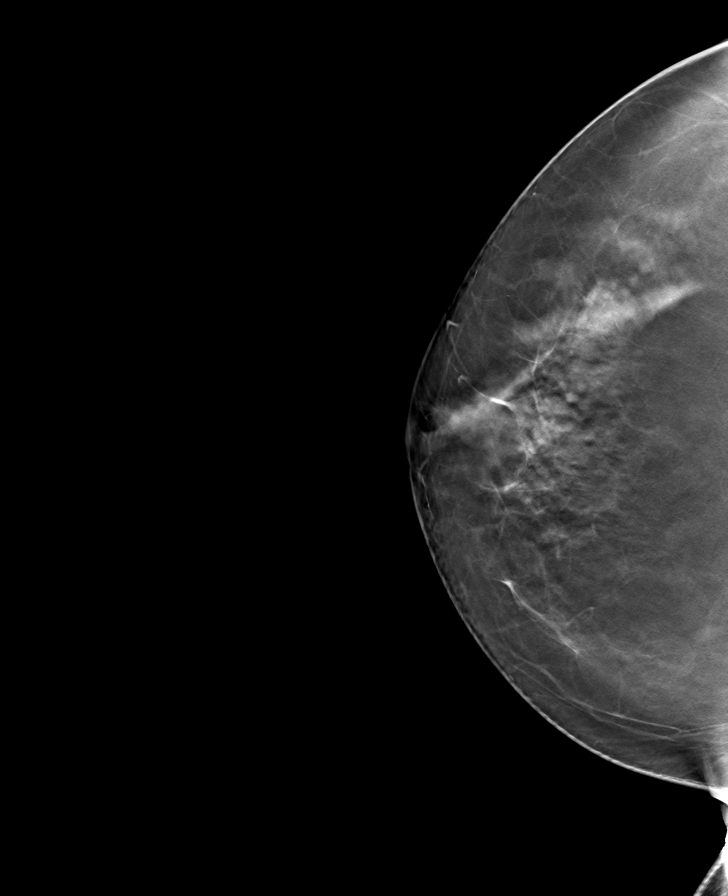

[R MLO tomo · tomo slice 49/97.0]
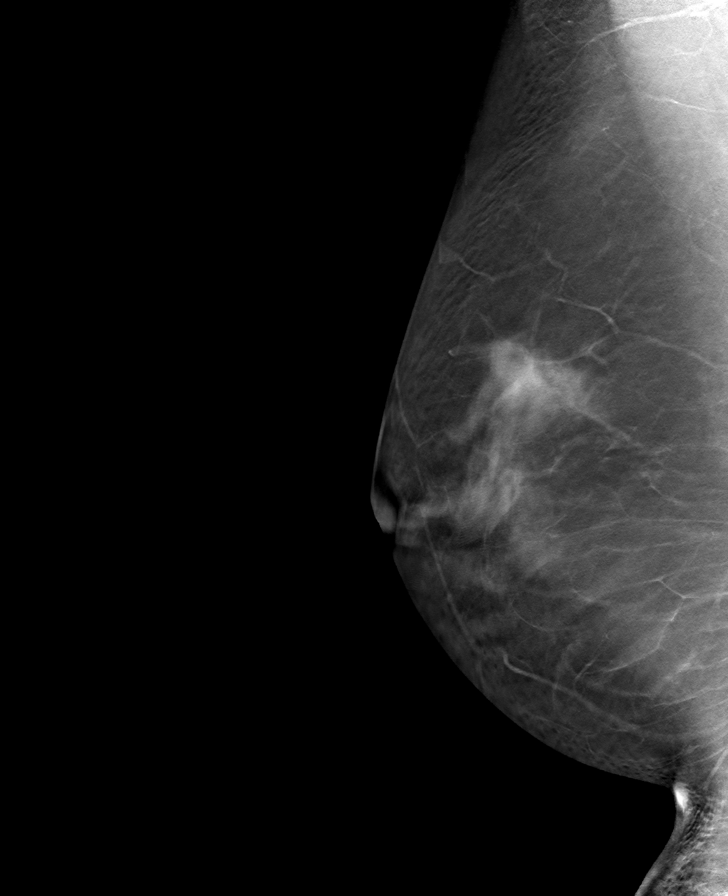

[L CC tomo · tomo slice 46/91.0]
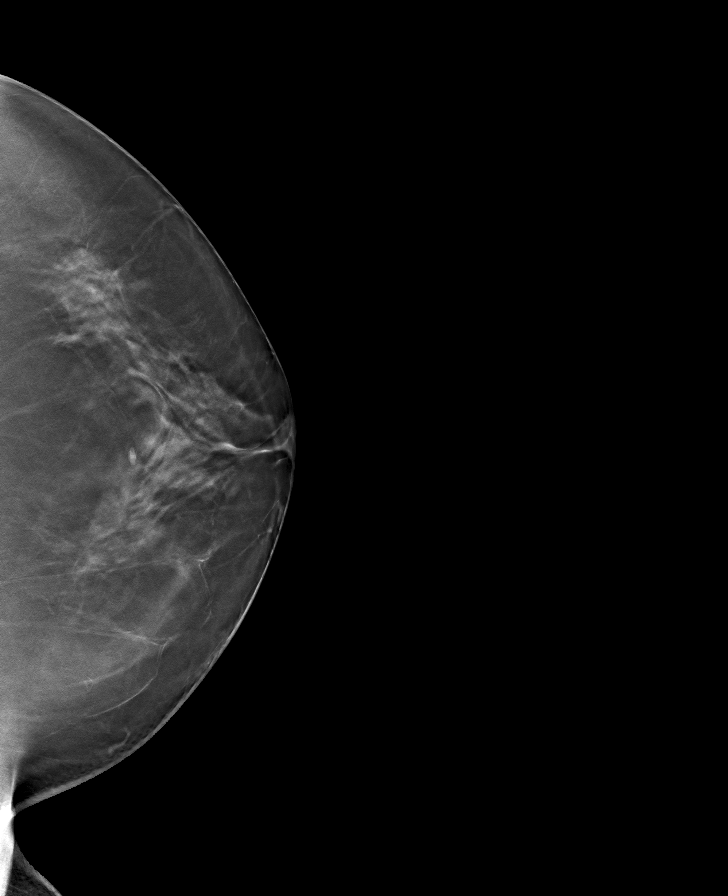

[L MLO tomo · tomo slice 49/97.0]
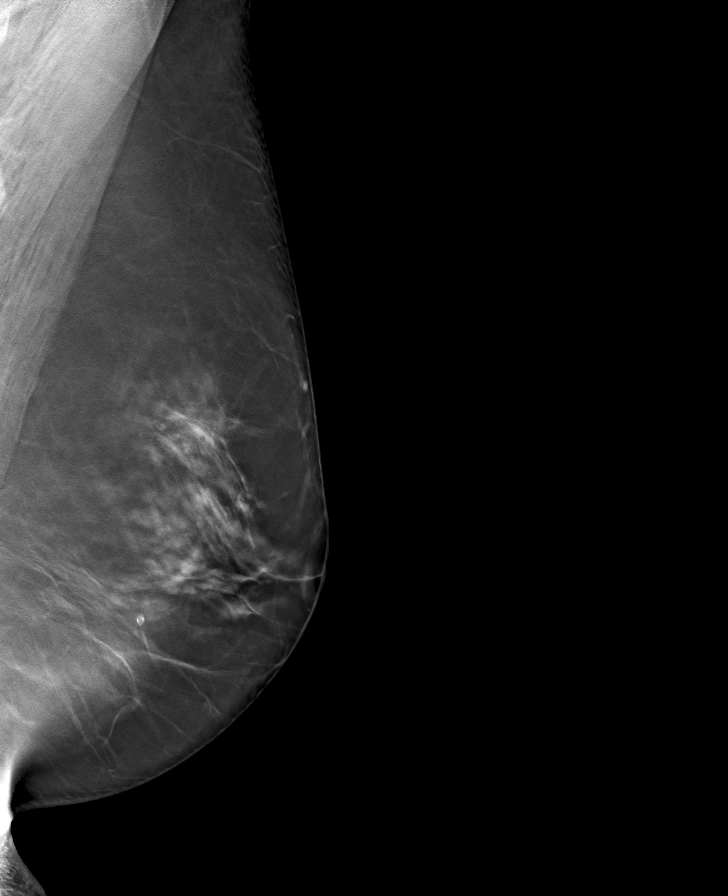

[8 of 24 positions shown; findings below may reference images not displayed]

ACR Breast Density Category b: There are scattered areas of
fibroglandular density.
FINDINGS: There are no findings suspicious for malignancy. Images were
processed with CAD.
IMPRESSION: No mammographic evidence of malignancy. A result letter of this
screening mammogram will be mailed directly to the patient.

RECOMMENDATION:
Screening mammogram in one year. (Code:[TQ])

BI-RADS CATEGORY  1: Negative.

## 2018-03-04 NOTE — Telephone Encounter (Signed)
-----   Message from Virginia Crews, MD sent at 03/04/2018  8:16 AM EDT ----- Negative Hep C and HIV screenings.  Virginia Crews, MD, MPH [facility] 03/04/2018 8:16 AM

## 2018-03-04 NOTE — Telephone Encounter (Signed)
Viewed by Ulysees Barns on 03/04/2018 8:20 AM

## 2018-03-05 ENCOUNTER — Telehealth: Payer: Self-pay

## 2018-03-05 LAB — PAP IG AND HPV HIGH-RISK
HPV, high-risk: NEGATIVE
PAP Smear Comment: 0

## 2018-03-05 NOTE — Telephone Encounter (Signed)
Pt advised.

## 2018-03-05 NOTE — Telephone Encounter (Signed)
-----   Message from Virginia Crews, MD sent at 03/05/2018  4:41 PM EDT ----- Normal pap smear, HPV negative.  Virginia Crews, MD, MPH [facility] 03/05/2018 4:41 PM

## 2018-03-13 ENCOUNTER — Encounter: Payer: Self-pay | Admitting: *Deleted

## 2018-03-17 ENCOUNTER — Ambulatory Visit: Payer: BLUE CROSS/BLUE SHIELD | Admitting: Obstetrics and Gynecology

## 2018-03-17 ENCOUNTER — Other Ambulatory Visit: Payer: Self-pay | Admitting: *Deleted

## 2018-03-17 ENCOUNTER — Inpatient Hospital Stay
Admission: RE | Admit: 2018-03-17 | Discharge: 2018-03-17 | Disposition: A | Discharge status: Home / Self Care | Payer: Self-pay | Source: Ambulatory Visit | Attending: *Deleted | Admitting: *Deleted

## 2018-03-17 ENCOUNTER — Encounter: Payer: Self-pay | Admitting: Obstetrics and Gynecology

## 2018-03-17 VITALS — BP 124/74 | Ht 61.0 in | Wt 176.0 lb

## 2018-03-17 DIAGNOSIS — N889 Noninflammatory disorder of cervix uteri, unspecified: Secondary | ICD-10-CM | POA: Diagnosis not present

## 2018-03-17 DIAGNOSIS — Z9289 Personal history of other medical treatment: Secondary | ICD-10-CM

## 2018-03-17 NOTE — Progress Notes (Signed)
Patient ID: Stephanie Shah, female   DOB: 06/24/1956, 62 y.o.   MRN: [id number]  Reason for Consult: Referral   Referred by Virginia Crews, MD  Subjective:     HPI:  Stephanie Shah is a 62 y.o. female who recently moved to the area in October. She was seen at Dr. Nancy Nordmann office and noted to have a cervical lesion.  Pap smear was NIL, HPV negative. She has not had vaginal spotting or bleeding. She is sexually active, no pain or bleeding after intercourse. Mammogram pending result. No family history of breast uterine or ovarian cancer. She reports that she entered menopause at 81.   Obstetrical History: History of one normal birth by cesarean section.   Past Medical History:  Diagnosis Date  . Hyperlipidemia   . Hypertension    Family History  Problem Relation Age of Onset  . Cancer Mother        pancreatic  . Hypertension Mother   . Alzheimer's disease Mother   . Heart attack Father        diet age 96  . Heart disease Maternal Grandfather   . Cancer Paternal Grandmother   . Dementia Paternal Grandmother   . Hypertension Sister   . Colon cancer Neg Hx   . Breast cancer Neg Hx   . Ovarian cancer Neg Hx   . Cervical cancer Neg Hx    Past Surgical History:  Procedure Laterality Date  . CESAREAN SECTION    . ROTATOR CUFF REPAIR Right   . TMJ ARTHROPLASTY    . TONSILLECTOMY    . TUBAL LIGATION      Short Social History:  Social History   Tobacco Use  . Smoking status: Former Smoker    Types: Cigarettes    Last attempt to quit: 11/13/2003    Years since quitting: 14.4  . Smokeless tobacco: Never Used  Substance Use Topics  . Alcohol use: Yes    Alcohol/week: 4.2 - 8.4 oz    Types: 7 - 14 Glasses of wine per week    Comment: 1-2 glasses of wine per day    Allergies  Allergen Reactions  . Penicillins Rash    Current Outpatient Medications  Medication Sig Dispense Refill  . aspirin EC 81 MG tablet Take 81 mg by mouth daily.    Marland Kitchen atorvastatin  (LIPITOR) 20 MG tablet TAKE 1 TABLET BY MOUTH EVERY DAY 90 tablet 2  . Biotin 1000 MCG tablet Take 1,000 mcg by mouth 3 (three) times daily.    . butalbital-acetaminophen-caffeine (FIORICET, ESGIC) 50-325-40 MG tablet Take by mouth 2 (two) times daily as needed for headache.    Marland Kitchen FLUoxetine (PROZAC) 40 MG capsule TAKE 1 CAPSULE BY MOUTH EVERY DAY 90 capsule 4  . hydrochlorothiazide (MICROZIDE) 12.5 MG capsule Take 12.5 mg by mouth daily.    Marland Kitchen lisinopril (PRINIVIL,ZESTRIL) 40 MG tablet Take 40 mg by mouth daily.    . Multiple Vitamin (MULTIVITAMIN) tablet Take 1 tablet by mouth daily.    . naproxen (NAPROSYN) 500 MG tablet Take 1 tablet (500 mg total) by mouth 2 (two) times daily with a meal. 30 tablet 0   No current facility-administered medications for this visit.     Review of Systems  Constitutional: Negative for chills, fatigue, fever and unexpected weight change.  HENT: Negative for trouble swallowing.  Eyes: Negative for loss of vision.  Respiratory: Negative for cough, shortness of breath and wheezing.  Cardiovascular: Negative for chest pain, leg swelling,  palpitations and syncope.  GI: Negative for abdominal pain, blood in stool, diarrhea, nausea and vomiting.  GU: Negative for difficulty urinating, dysuria, frequency and hematuria.  Musculoskeletal: Negative for back pain, leg pain and joint pain.  Skin: Negative for rash.  Neurological: Negative for dizziness, headaches, light-headedness, numbness and seizures.  Psychiatric: Negative for behavioral problem, confusion, depressed mood and sleep disturbance.        Objective:  Objective   Vitals:   03/17/18 0820  BP: 124/74  Weight: 176 lb (79.8 kg)  Height: 5\' 1"  (1.549 m)   Body mass index is 33.25 kg/m.  Physical Exam  Constitutional: She is oriented to person, place, and time. She appears well-developed and well-nourished.  HENT:  Head: Normocephalic and atraumatic.  Eyes: EOM are normal.  Cardiovascular:  Normal rate, regular rhythm and normal heart sounds.  Pulmonary/Chest: Effort normal and breath sounds normal.  Abdominal: Soft. She exhibits no distension and no mass. There is no tenderness. There is no rebound and no guarding.  Genitourinary: Vagina normal.    Neurological: She is alert and oriented to person, place, and time.  Skin: Skin is warm and dry.  Psychiatric: She has a normal mood and affect. Her behavior is normal. Judgment and thought content normal.  Nursing note and vitals reviewed.  CERVICAL BIOPSY NOTE The indications forcervical biopsy (rule out neoplasia) were reviewed.   Risks of the biopsy including pain, bleeding, infection, inadequate specimen, scarring and need for additional procedures  were discussed. The patient stated understanding and agreed to undergo procedure today. Consent was signed,  time out performed.   One colposcopic biopsy was done. Small bleeding was noted and hemostasis was achieved using silver nitrate sticks and Monel's solution.  The patient tolerated the procedure well. Post-procedure instructions  (pelvic rest for one week) were given to the patient. The patient is to call with heavy bleeding, fever greater than 100.4, foul smelling vaginal discharge or other concerns.        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  Biopsy performed today in office. Follow up based on results, if normal tise plan for visual surveillance again in 2-3 months.      Adrian Prows MD [facility], [facility] 04/13/18 3:55 PM

## 2018-03-17 NOTE — Progress Notes (Signed)
error 

## 2018-03-20 LAB — PATHOLOGY

## 2018-03-21 ENCOUNTER — Telehealth: Payer: Self-pay

## 2018-03-21 NOTE — Progress Notes (Signed)
Negative biopsy

## 2018-03-21 NOTE — Telephone Encounter (Signed)
Returned patient's call. Left message.  Biopsy was negative. Told if the clots are less than the palm of her hand and she is not filling pad that this amount of bleeding is okay. If she is having heavy bleeding and it does not stop by Monday she can be seen in the office.  Thank you.

## 2018-03-21 NOTE — Telephone Encounter (Signed)
Pt calling today wanting to speak with CS following up from appt this past Monday. CS did BX on a place on cervix. Pt still having a lot of dark blood and this morning passing some "tissue". Pt wanting to know if this is normal . Please advise.

## 2018-03-28 ENCOUNTER — Emergency Department
Admission: EM | Admit: 2018-03-28 | Discharge: 2018-03-28 | Disposition: A | Discharge status: Home / Self Care | Payer: BLUE CROSS/BLUE SHIELD | Attending: Emergency Medicine | Admitting: Emergency Medicine

## 2018-03-28 ENCOUNTER — Other Ambulatory Visit: Payer: Self-pay

## 2018-03-28 ENCOUNTER — Encounter: Payer: Self-pay | Admitting: Emergency Medicine

## 2018-03-28 ENCOUNTER — Emergency Department: Payer: BLUE CROSS/BLUE SHIELD

## 2018-03-28 DIAGNOSIS — I10 Essential (primary) hypertension: Secondary | ICD-10-CM | POA: Insufficient documentation

## 2018-03-28 DIAGNOSIS — Y998 Other external cause status: Secondary | ICD-10-CM | POA: Insufficient documentation

## 2018-03-28 DIAGNOSIS — Y9389 Activity, other specified: Secondary | ICD-10-CM | POA: Diagnosis not present

## 2018-03-28 DIAGNOSIS — Z7982 Long term (current) use of aspirin: Secondary | ICD-10-CM | POA: Diagnosis not present

## 2018-03-28 DIAGNOSIS — Z79899 Other long term (current) drug therapy: Secondary | ICD-10-CM | POA: Diagnosis not present

## 2018-03-28 DIAGNOSIS — G35 Multiple sclerosis: Secondary | ICD-10-CM | POA: Insufficient documentation

## 2018-03-28 DIAGNOSIS — S0990XA Unspecified injury of head, initial encounter: Secondary | ICD-10-CM | POA: Diagnosis not present

## 2018-03-28 DIAGNOSIS — Y92008 Other place in unspecified non-institutional (private) residence as the place of occurrence of the external cause: Secondary | ICD-10-CM | POA: Insufficient documentation

## 2018-03-28 DIAGNOSIS — W108XXA Fall (on) (from) other stairs and steps, initial encounter: Secondary | ICD-10-CM | POA: Insufficient documentation

## 2018-03-28 DIAGNOSIS — S0181XA Laceration without foreign body of other part of head, initial encounter: Secondary | ICD-10-CM | POA: Insufficient documentation

## 2018-03-28 DIAGNOSIS — S0993XA Unspecified injury of face, initial encounter: Secondary | ICD-10-CM | POA: Diagnosis present

## 2018-03-28 DIAGNOSIS — Z87891 Personal history of nicotine dependence: Secondary | ICD-10-CM | POA: Insufficient documentation

## 2018-03-28 HISTORY — DX: Hyperlipidemia, unspecified: E78.5

## 2018-03-28 HISTORY — DX: Essential (primary) hypertension: I10

## 2018-03-28 IMAGING — CT CT HEAD W/O CM
3 of 4 series · 15 of 47 positions shown, 18 images · non-contrast
Comparison: None.

CLINICAL DATA: Fall.  Laceration to forehead.

EXAM:
CT HEAD WITHOUT CONTRAST
TECHNIQUE: Contiguous axial images were obtained from the base of the skull
through the vertex without intravenous contrast.

[Series 3: head wo · axial · 0.43mm/px · z∈[-124,+6]mm · 9 of 31 slices shown, 12 images]
[im 3/31  brain]
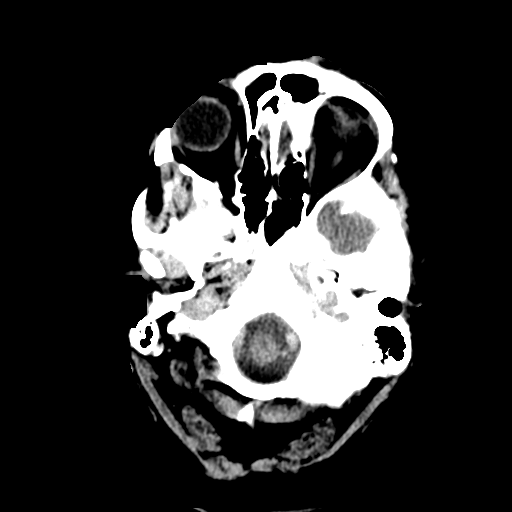
[im 3/31  bone]
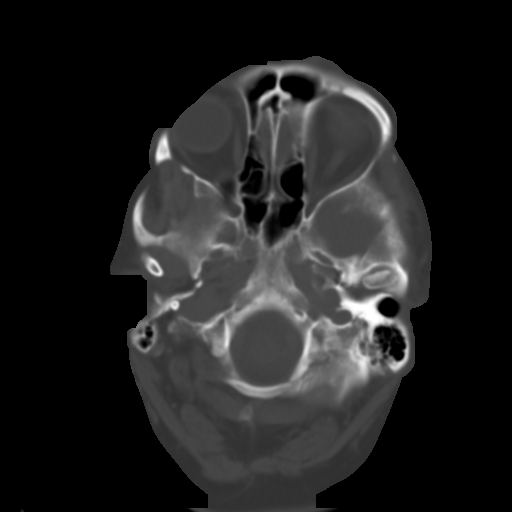
[im 7/31  brain]
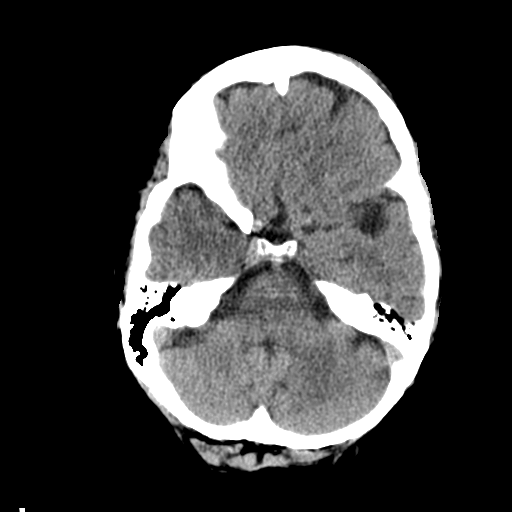
[im 9/31  brain]
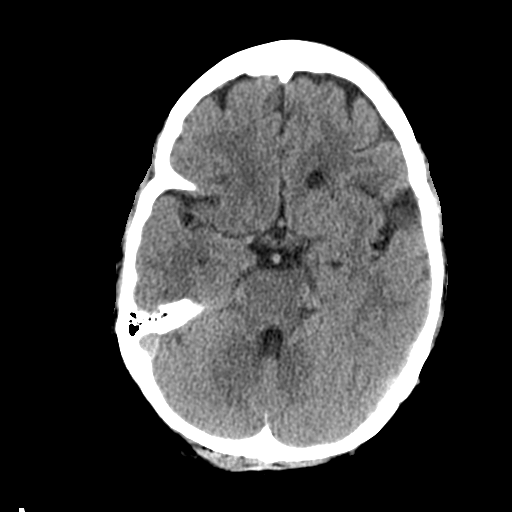
[im 13/31  brain]
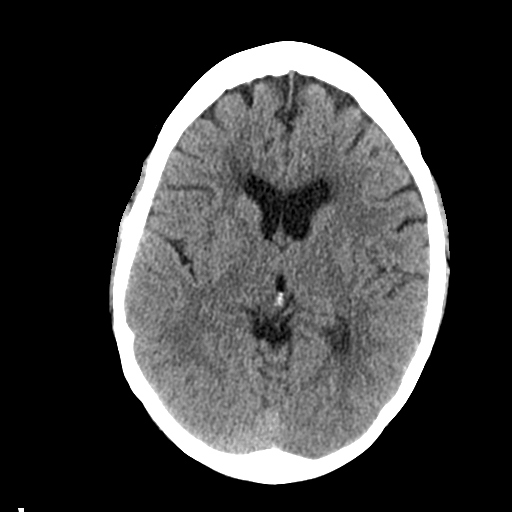
[im 17/31  brain]
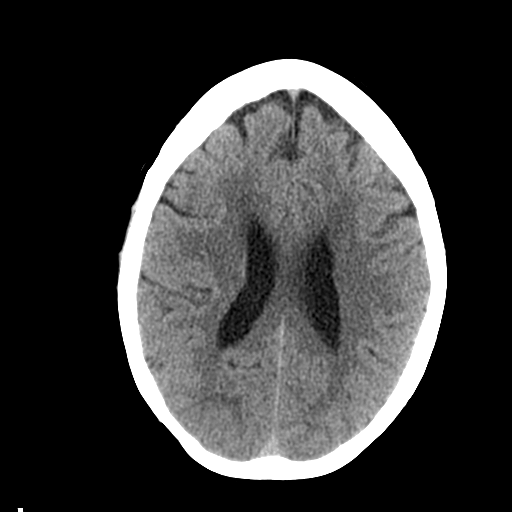
[im 17/31  bone]
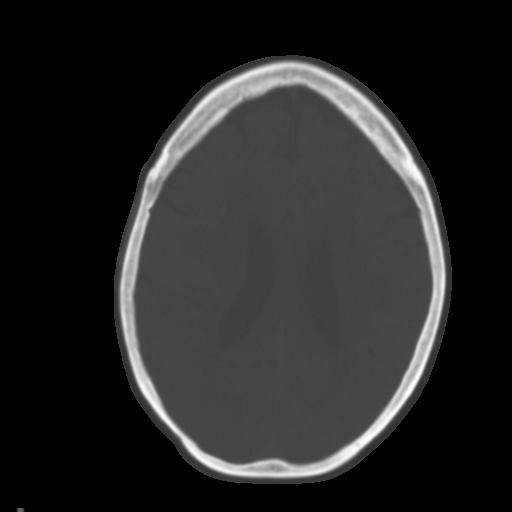
[im 19/31  brain]
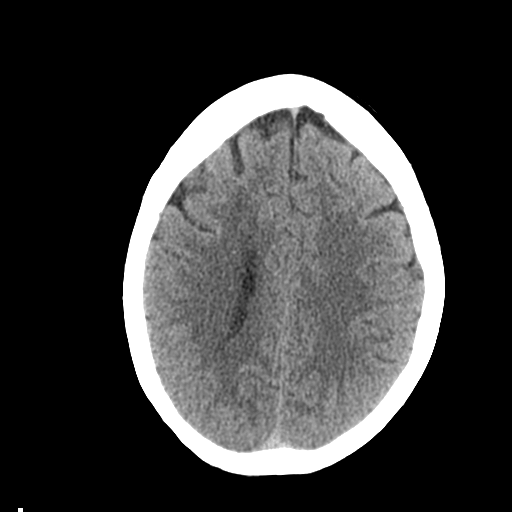
[im 23/31  brain]
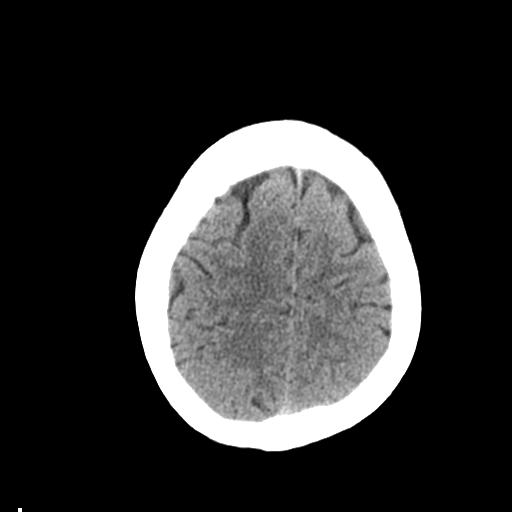
[im 25/31  brain]
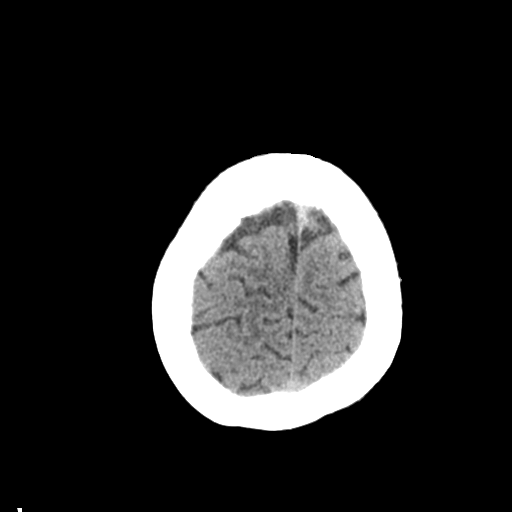
[im 29/31  brain]
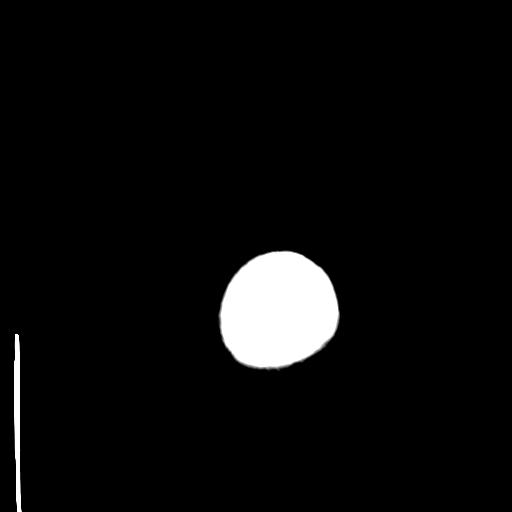
[im 29/31  bone]
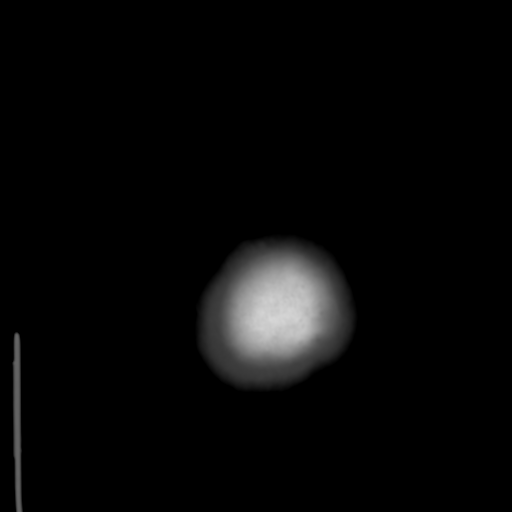

[Series 5: coronal soft tissue · coronal · 0.29mm/px · 3 of 65 slices shown]
[im 22/65  brain]
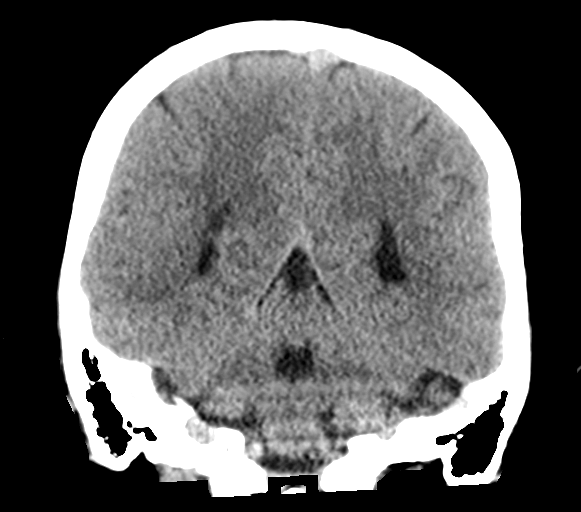
[im 29/65  brain]
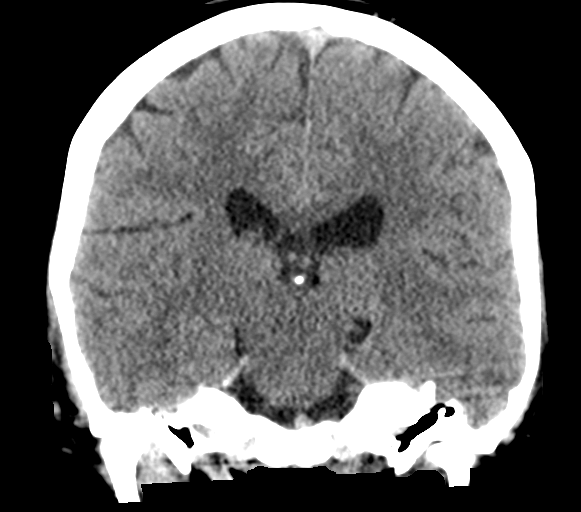
[im 36/65  brain]
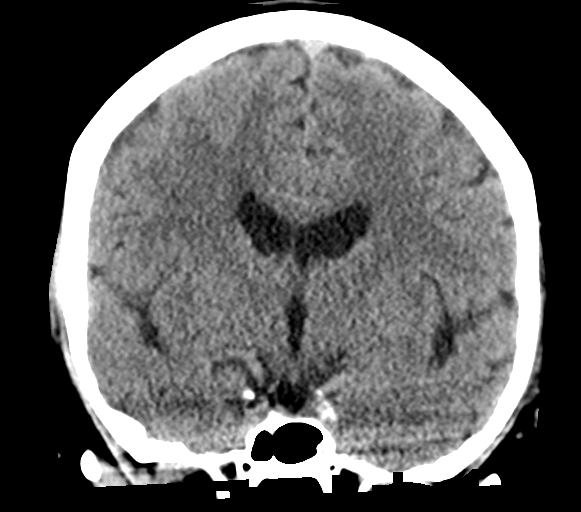

[Series 6: sagittal soft tissue · sagittal · 0.30mm/px · 3 of 51 slices shown]
[im 17/51  brain]
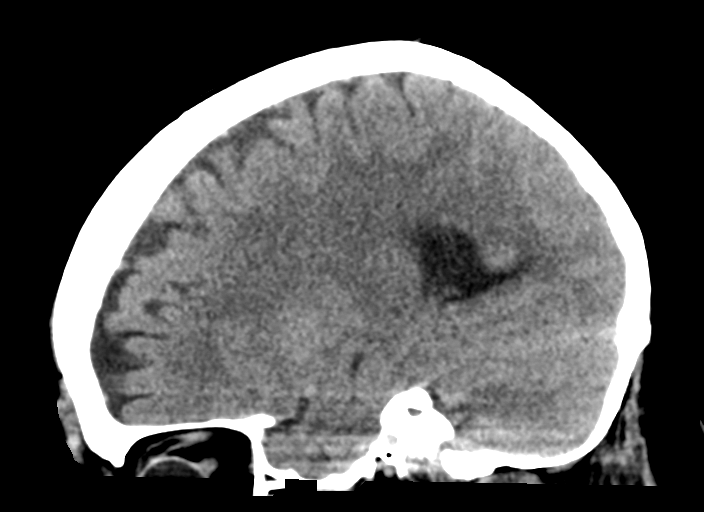
[im 26/51  brain]
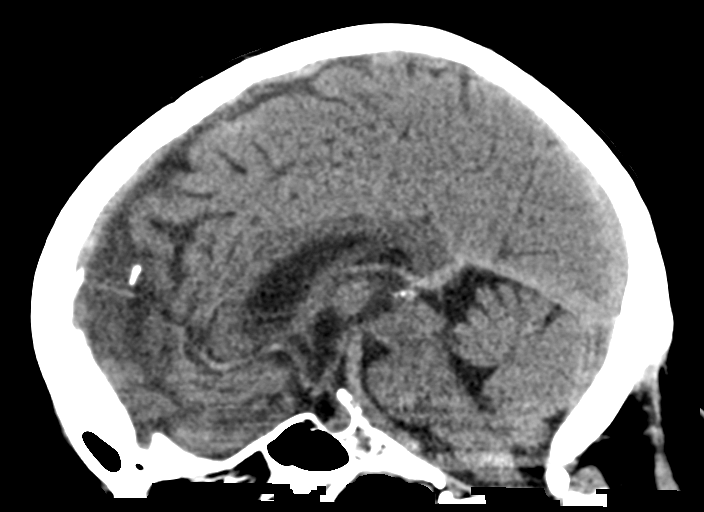
[im 34/51  brain]
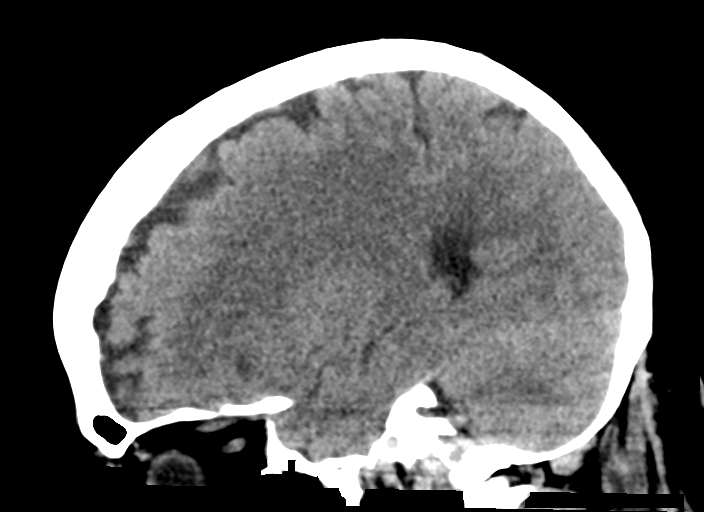

[15 of 47 positions shown; findings below may reference images not displayed]

FINDINGS: Brain: No intracranial hemorrhage. No parenchymal contusion. No
midline shift or mass effect. Basilar cisterns are patent. No skull
base fracture. No fluid in the paranasal sinuses or mastoid air
cells. Orbits are normal.

Vascular: No hyperdense vessel or unexpected calcification.

Skull: No skull fracture

Sinuses/Orbits: Shallow hematoma over the LEFT orbit. Mild preseptal
swelling on the LEFT. No proptosis. LEFT globe normal.

Other: None
IMPRESSION: 1. Shallow LEFT frontal scalp hematoma and preseptal swelling.
2. No intracranial trauma. No fracture.
3.   No evidence of orbital injury on limited view of the orbit.

## 2018-03-28 MED ORDER — LIDOCAINE HCL (PF) 1 % IJ SOLN
INTRAMUSCULAR | Status: AC
  Filled 2018-03-28: qty 5

## 2018-03-28 MED ORDER — LIDOCAINE HCL (PF) 1 % IJ SOLN
5.0000 mL | Freq: Once | INTRAMUSCULAR | Status: AC
  Administered 2018-03-28: 5 mL via INTRADERMAL

## 2018-03-28 NOTE — ED Triage Notes (Signed)
Pt arrives via [facility] with c/o fall. Pt was enjoying some scotch 4-5 glasses per EMS. Per EMS, VS WDL. Pt has 1-1/2 inch laceration to forehead with controlled bleeding at this time. Pt denies LOC. Pt is in NAD.

## 2018-03-28 NOTE — ED Provider Notes (Signed)
[facility] Emergency Department Provider Note ___________________________________________   None    (approximate)  I have reviewed the triage vital signs and the nursing notes.   HISTORY  Chief Complaint Laceration  HPI Stephanie Shah is a 62 y.o. female who presents to the emergency department for treatment and evaluation after a fall.  She was outside on her patio and was going to go in the house.  When she went to walk up steps, she tripped and struck her face on something.  Patient was celebrating a negative stress test result and had a few glasses of scotch.  She did have a brief period of loss of consciousness.  Husband witnessed the event and states that after less than a minute she came to.  No alleviating measures have been attempted prior to arrival.  Tetanus vaccination is up-to-date.  Past Medical History:  Diagnosis Date  . Hyperlipidemia   . Hypertension     Patient Active Problem List   Diagnosis Date Noted  . Lesion of cervix 03/03/2018  . Cervicogenic headache 09/19/2017  . Allergic rhinitis 08/26/2017  . Arthritis 08/26/2017  . Hyperlipidemia 08/26/2017  . Hypertension 08/26/2017  . MS (multiple sclerosis) ([place]) 08/26/2017    Past Surgical History:  Procedure Laterality Date  . CESAREAN SECTION    . ROTATOR CUFF REPAIR Right   . TMJ ARTHROPLASTY    . TONSILLECTOMY    . TUBAL LIGATION      Prior to Admission medications   Medication Sig Start Date End Date Taking? Authorizing Provider  aspirin EC 81 MG tablet Take 81 mg by mouth daily.    [provider]  atorvastatin (LIPITOR) 20 MG tablet TAKE 1 TABLET BY MOUTH EVERY DAY 01/14/18   Birdie Sons, MD  Biotin 1000 MCG tablet Take 1,000 mcg by mouth 3 (three) times daily.    [provider]  butalbital-acetaminophen-caffeine (FIORICET, ESGIC) 50-325-40 MG tablet Take by mouth 2 (two) times daily as needed for headache.    [provider]  FLUoxetine  (PROZAC) 40 MG capsule TAKE 1 CAPSULE BY MOUTH EVERY DAY 03/04/18   Birdie Sons, MD  hydrochlorothiazide (MICROZIDE) 12.5 MG capsule Take 12.5 mg by mouth daily.    [provider]  lisinopril (PRINIVIL,ZESTRIL) 40 MG tablet Take 40 mg by mouth daily.    [provider]  Multiple Vitamin (MULTIVITAMIN) tablet Take 1 tablet by mouth daily.    [provider]    Allergies Penicillins  Family History  Problem Relation Age of Onset  . Cancer Mother        pancreatic  . Hypertension Mother   . Alzheimer's disease Mother   . Heart attack Father        diet age 23  . Heart disease Maternal Grandfather   . Cancer Paternal Grandmother   . Dementia Paternal Grandmother   . Hypertension Sister   . Colon cancer Neg Hx   . Breast cancer Neg Hx   . Ovarian cancer Neg Hx   . Cervical cancer Neg Hx     Social History Social History   Tobacco Use  . Smoking status: Former Smoker    Types: Cigarettes    Last attempt to quit: 11/13/2003    Years since quitting: 14.3  . Smokeless tobacco: Never Used  Substance Use Topics  . Alcohol use: Yes    Alcohol/week: 4.2 - 8.4 oz    Types: 7 - 14 Glasses of wine per week  Comment: 1-2 glasses of wine per day  . Drug use: No    Review of Systems  Constitutional: No fever/chills Eyes: No visual changes. ENT: No malocclusion Cardiovascular: Denies chest pain. Respiratory: Denies shortness of breath. Gastrointestinal: No abdominal pain.  No nausea, no vomiting. Musculoskeletal: Negative for neck pain.  Negative for back pain. Skin: Positive for laceration to the forehead and abrasions to the knees Neurological: Positive for headaches, negative for focal weakness or numbness. ____________________________________________   PHYSICAL EXAM:  VITAL SIGNS: ED Triage Vitals  Enc Vitals Group     BP 03/28/18 2013 (!) 144/93     Pulse Rate 03/28/18 2013 (!) 105     Resp --      Temp 03/28/18 2013 98.1 F (36.7 C)      Temp Source 03/28/18 2013 Oral     SpO2 03/28/18 2013 97 %     Weight 03/28/18 2006 176 lb (79.8 kg)     Height 03/28/18 2006 5\' 1"  (1.549 m)     Head Circumference --      Peak Flow --      Pain Score 03/28/18 2004 0     Pain Loc --      Pain Edu? --      Excl. in [place]? --     Constitutional: Alert and oriented. Well appearing and in no acute distress. Eyes: Conjunctivae are normal. PERRL. EOMI. periorbital ecchymosis on the left side Head: Laceration to the forehead  nose: No epistaxis  mouth/Throat: Mucous membranes are moist.  Oropharynx non-erythematous.  No dental injury noted Neck: No stridor.   Cardiovascular: Normal rate, regular rhythm. Grossly normal heart sounds.  Good peripheral circulation. Respiratory: Normal respiratory effort.  No retractions. Lungs CTAB. Gastrointestinal: Soft and nontender. No distention. No abdominal bruits. No CVA tenderness. Musculoskeletal: No lower extremity tenderness nor edema.  No joint effusions. Neurologic:  Normal speech and language. No gross focal neurologic deficits are appreciated.  Skin: 7 cm laceration to the left upper forehead Psychiatric: Mood and affect are normal. Speech and behavior are normal.  ____________________________________________   LABS (all labs ordered are listed, but only abnormal results are displayed)  Labs Reviewed - No data to display ____________________________________________  EKG  Not indicated ____________________________________________  RADIOLOGY  Official radiology report(s): Ct Head Wo Contrast  Result Date: 03/28/2018 CLINICAL DATA:  Fall.  Laceration to forehead. EXAM: CT HEAD WITHOUT CONTRAST TECHNIQUE: Contiguous axial images were obtained from the base of the skull through the vertex without intravenous contrast. COMPARISON:  None. FINDINGS: Brain: No intracranial hemorrhage. No parenchymal contusion. No midline shift or mass effect. Basilar cisterns are patent. No skull base  fracture. No fluid in the paranasal sinuses or mastoid air cells. Orbits are normal. Vascular: No hyperdense vessel or unexpected calcification. Skull: No skull fracture Sinuses/Orbits: Shallow hematoma over the LEFT orbit. Mild preseptal swelling on the LEFT. No proptosis. LEFT globe normal. Other: None IMPRESSION: 1. Shallow LEFT frontal scalp hematoma and preseptal swelling. 2. No intracranial trauma. No fracture. 3.   No evidence of orbital injury on limited view of the orbit. Electronically Signed   By: Suzy Bouchard M.D.   On: 03/28/2018 20:38    ____________________________________________   PROCEDURES  Procedure(s) performed:   Marland KitchenMarland KitchenLaceration Repair Date/Time: 03/29/2018 12:00 AM Performed by: Victorino Dike, FNP Authorized by: Victorino Dike, FNP   Consent:    Consent obtained:  Verbal   Consent given by:  Patient   Risks discussed:  Infection, pain, retained  foreign body, poor cosmetic result and poor wound healing Anesthesia (see MAR for exact dosages):    Anesthesia method:  Local infiltration   Local anesthetic:  Lidocaine 1% w/o epi Laceration details:    Location:  Face   Face location:  Forehead   Length (cm):  7 Repair type:    Repair type:  Intermediate Pre-procedure details:    Preparation:  Patient was prepped and draped in usual sterile fashion Exploration:    Hemostasis achieved with:  Direct pressure   Wound exploration: entire depth of wound probed and visualized     Contaminated: no   Treatment:    Area cleansed with:  Saline and Betadine   Amount of cleaning:  Extensive   Irrigation solution:  Sterile saline   Irrigation method:  Syringe   Visualized foreign bodies/material removed: no   Skin repair:    Repair method:  Sutures   Suture size:  6-0   Suture material:  Prolene   Suture technique:  Simple interrupted Approximation:    Approximation:  Close Post-procedure details:    Dressing:  Sterile dressing and antibiotic ointment    Patient tolerance of procedure:  Tolerated well, no immediate complications    Critical Care performed: No  ____________________________________________   INITIAL IMPRESSION / ASSESSMENT AND PLAN / ED COURSE  As part of my medical decision making, I reviewed the following data within the [place]    Female presenting to the emergency department for treatment and repair of laceration to the left side of her forehead.  CT scan was completed prior to laceration repair.  There were no acute findings per the radiologist.  Significant swelling is present in the area of the laceration.  Wound edges were approximated, however a scant space was allowed in case of further swelling tonight.  Patient was very pleasant and although seemed to understand wound care and discharge instructions, husband was also made aware of these as well.  Signs and symptoms of head injury were reviewed with the husband and he will return with her to the emergency department for any concerns.  She is to have the sutures removed in 5 days by her primary care provider.  She was instructed to see him earlier for symptoms of concern.  She was advised to use ice on the area.  She was advised to avoid ibuprofen and take Tylenol instead if needed for headache. ____________________________________________   FINAL CLINICAL IMPRESSION(S) / ED DIAGNOSES  Final diagnoses:  Laceration of forehead, initial encounter  Injury of head, initial encounter     ED Discharge Orders    None       Note:  This document was prepared using Dragon voice recognition software and may include unintentional dictation errors.    Victorino Dike, FNP 03/29/18 0006    Harvest Dark, MD 03/31/18 2351

## 2018-03-28 NOTE — ED Notes (Addendum)
Bandaid applied to wound. IV d/c.

## 2018-03-28 NOTE — Discharge Instructions (Signed)
Do not get the sutured area wet for 24 hours. After 24 hours, shower/bathe as usual and pat the area dry. Change the bandage 2 times per day and apply antibiotic ointment. Leave open to air when at no risk of getting the area dirty, but cover at night before bed.   

## 2018-04-01 ENCOUNTER — Ambulatory Visit: Payer: BLUE CROSS/BLUE SHIELD | Admitting: Family Medicine

## 2018-04-02 ENCOUNTER — Ambulatory Visit: Payer: BLUE CROSS/BLUE SHIELD | Admitting: Family Medicine

## 2018-04-02 ENCOUNTER — Encounter: Payer: Self-pay | Admitting: Family Medicine

## 2018-04-02 ENCOUNTER — Encounter: Payer: Self-pay | Admitting: *Deleted

## 2018-04-02 VITALS — BP 140/90 | HR 82 | Temp 97.8°F | Resp 16 | Wt 176.0 lb

## 2018-04-02 DIAGNOSIS — L03811 Cellulitis of head [any part, except face]: Secondary | ICD-10-CM

## 2018-04-02 DIAGNOSIS — S0181XA Laceration without foreign body of other part of head, initial encounter: Secondary | ICD-10-CM

## 2018-04-02 DIAGNOSIS — M25551 Pain in right hip: Secondary | ICD-10-CM

## 2018-04-02 MED ORDER — CEPHALEXIN 500 MG PO CAPS: 500.0000 mg | ORAL_CAPSULE | Freq: Four times a day (QID) | ORAL | 0 refills | Status: AC

## 2018-04-02 MED ORDER — KETOROLAC TROMETHAMINE 60 MG/2ML IM SOLN
60.0000 mg | Freq: Once | INTRAMUSCULAR | Status: AC
  Administered 2018-04-02: 60 mg via INTRAMUSCULAR

## 2018-04-02 MED ORDER — NAPROXEN 500 MG PO TABS: 500.0000 mg | ORAL_TABLET | Freq: Two times a day (BID) | ORAL | 0 refills | Status: DC

## 2018-04-02 NOTE — Progress Notes (Signed)
Patient: Stephanie Shah Female    DOB: 1956-06-26   62 y.o.   MRN: [id number] Visit Date: 04/02/2018  Today's Provider: Lelon Huh, MD   No chief complaint on file.  Subjective:    HPI   Follow up ER visit  Patient was seen in ER for 03/28/2018 on 03/28/2018. She was treated for fall. Treatment for this included; laceration to the forehead. Patient had sutures placed to close wound and was advised to follow-up with pcp in 5 days to them removed.  She reports this condition is Unchanged.  She states she got tripped going up steps while wearing flip flops. She reports she has had persistent right hip pain for years and she thinks this impaired her ability to go up steps and contributed to fall. She states she remains very sore around laceration above eye, but it it has not been draining or bleeding. No other headaches, but soreness around forehead is making it hard to rest at night.  ------------------------------------------------------------------------------------    Allergies  Allergen Reactions  . Penicillins Rash     Current Outpatient Medications:  .  aspirin EC 81 MG tablet, Take 81 mg by mouth daily., Disp: , Rfl:  .  atorvastatin (LIPITOR) 20 MG tablet, TAKE 1 TABLET BY MOUTH EVERY DAY, Disp: 90 tablet, Rfl: 2 .  Biotin 1000 MCG tablet, Take 1,000 mcg by mouth 3 (three) times daily., Disp: , Rfl:  .  butalbital-acetaminophen-caffeine (FIORICET, ESGIC) 50-325-40 MG tablet, Take by mouth 2 (two) times daily as needed for headache., Disp: , Rfl:  .  FLUoxetine (PROZAC) 40 MG capsule, TAKE 1 CAPSULE BY MOUTH EVERY DAY, Disp: 90 capsule, Rfl: 4 .  hydrochlorothiazide (MICROZIDE) 12.5 MG capsule, Take 12.5 mg by mouth daily., Disp: , Rfl:  .  lisinopril (PRINIVIL,ZESTRIL) 40 MG tablet, Take 40 mg by mouth daily., Disp: , Rfl:  .  Multiple Vitamin (MULTIVITAMIN) tablet, Take 1 tablet by mouth daily., Disp: , Rfl:   Review of Systems  Constitutional: Negative for  appetite change, chills, fatigue and fever.  Respiratory: Negative for chest tightness and shortness of breath.   Cardiovascular: Negative for chest pain and palpitations.  Gastrointestinal: Negative for abdominal pain, nausea and vomiting.  Neurological: Negative for dizziness and weakness.    Social History   Tobacco Use  . Smoking status: Former Smoker    Types: Cigarettes    Last attempt to quit: 11/13/2003    Years since quitting: 14.3  . Smokeless tobacco: Never Used  Substance Use Topics  . Alcohol use: Yes    Alcohol/week: 4.2 - 8.4 oz    Types: 7 - 14 Glasses of wine per week    Comment: 1-2 glasses of wine per day   Objective:   BP 140/90 (BP Location: Right Arm, Patient Position: Sitting, Cuff Size: Normal)   Pulse 82   Temp 97.8 F (36.6 C) (Oral)   Resp 16   Wt 176 lb (79.8 kg)   SpO2 99%   BMI 33.25 kg/m     Physical Exam  General appearance: alert, well developed, well nourished, cooperative and in no distress Head: Normocephalic, without obvious abnormality. Well approximated laceration on forehead above right eye with 8 sutures in place. Slightly swollen with about a cm of faint erythema around wound.  Respiratory: Respirations even and unlabored, normal respiratory rate Extremities: No gross deformities Neurologic: Mental status: Alert, oriented to person, place, and time, thought content appropriate.     Assessment &  Plan:     1. Laceration of forehead, initial encounter 5 days post suture placement, healing well. Some signs of mild cellulitis as below. Removed 8 sutures without difficulty. Wound remains intact and well approximated. Recommend apply triple antibiotic until completely healed. Work excuse given for [employer].   2. Cellulitis of head except face  - naproxen (NAPROSYN) 500 MG tablet; Take 1 tablet (500 mg total) by mouth 2 (two) times daily with a meal.  Dispense: 30 tablet; Refill: 0 - cephALEXin (KEFLEX) 500 MG capsule; Take 1 capsule  (500 mg total) by mouth 4 (four) times daily for 5 days.  Dispense: 20 capsule; Refill: 0  3. Right hip pain  - naproxen (NAPROSYN) 500 MG tablet; Take 1 tablet (500 mg total) by mouth 2 (two) times daily with a meal.  Dispense: 30 tablet; Refill: 0 - ketorolac (TORADOL) injection 60 mg Refer orthopedics.       Lelon Huh, MD  [facility]

## 2018-05-06 ENCOUNTER — Other Ambulatory Visit: Payer: Self-pay

## 2018-05-06 ENCOUNTER — Ambulatory Visit: Payer: BLUE CROSS/BLUE SHIELD | Attending: Orthopedic Surgery

## 2018-05-06 DIAGNOSIS — M6281 Muscle weakness (generalized): Secondary | ICD-10-CM | POA: Diagnosis present

## 2018-05-06 DIAGNOSIS — M25551 Pain in right hip: Secondary | ICD-10-CM | POA: Insufficient documentation

## 2018-05-06 NOTE — Therapy (Signed)
[facility] [address], [place], [address] Phone: [phone]   Fax:  [phone]  Physical Therapy Evaluation  Patient Details  Name: Stephanie Shah MRN: [id number] Date of Birth: 30-Jun-1956 Referring Provider: Thornton Park, MD   Encounter Date: 05/06/2018  PT End of Session - 05/06/18 1436    Visit Number  1    Number of Visits  17    Date for PT Re-Evaluation  07/03/18    Authorization Type  17    Authorization Time Period  of 20 visits for insurance    PT Start Time  1436    PT Stop Time  1535    PT Time Calculation (min)  59 min    Equipment Utilized During Treatment  Gait belt    Activity Tolerance  Patient tolerated treatment well    Behavior During Therapy  [facility] for tasks assessed/performed       Past Medical History:  Diagnosis Date  . Hyperlipidemia   . Hypertension     Past Surgical History:  Procedure Laterality Date  . CESAREAN SECTION    . ROTATOR CUFF REPAIR Right   . TMJ ARTHROPLASTY    . TONSILLECTOMY    . TUBAL LIGATION      There were no vitals filed for this visit.   Subjective Assessment - 05/06/18 1444    Subjective  R hip pain: 0.5/10 currently. 7/10 at worst for the past 3 weeks.     Pertinent History  R hip flexor strain. Gradual onset since a couple of years ago. L knee started hurting due to arthritis about 8 years ago. Moved to [facility] in October 2018. Started noticing R hip hurting during the 8 years of L knee pain. Had her R hip checked and was told that the x-ray did not look like she had arthritis in the hip and her pain might be muscle related.  Pt states the her R hip is not hurting today. Feels like a catch in her R hip after sitting for a while. Pt fell recently as she was going up the step and her L flip flop got caught onto a step. Tried to catch herself backwards with her R leg but her hip said no and patient twisted to the L, tried to catch herself on furniture,  then fell.  Has had chest discomfort. Had a nuclear stress test with imaging and the results were good.   Started working at [employer] around  March 2019 and is on her feet a lot.  Has to sit down due to R hip. Also has to wear a L knee brace (sleeve) due to knee pain. The sleeve helps.  Does not sleep with a pillow between her knees    Patient Stated Goals  I'd like to be able to just get up and walk and not have my R hip catch (around glute med/min muscle belly).     Currently in Pain?  Yes    Pain Score  -- 0.5/10    Pain Location  Hip    Pain Orientation  Right    Pain Descriptors / Indicators  Nagging;Sharp catch    Pain Type  Chronic pain    Pain Onset  More than a month ago    Pain Frequency  Occasional    Aggravating Factors   standing up after sitting for too long (about 30 minutes), standing for long periods (about 45 min to 1 hour before R  hip bothers her), going up and down stairs.  Sleeping on her R side.     Pain Relieving Factors  rest          [facility] PT Assessment - 05/06/18 1459      Assessment   Medical Diagnosis  R hip flexor strain    Referring Provider  Thornton Park, MD    Onset Date/Surgical Date  04/23/18    Hand Dominance  Left    Prior Therapy  No known PT for current condition      Restrictions   Other Position/Activity Restrictions  no known restrictions      Balance Screen   Has the patient fallen in the past 6 months  Yes    How many times?  1 Pt tripped with her L flip flop onto a stair step    Has the patient had a decrease in activity level because of a fear of falling?   No    Is the patient reluctant to leave their home because of a fear of falling?   No      Home Environment   Additional Comments  1.5 steps to enter back door, no rails. 4 steps to enter front door with bilateral rail.  Pt lives in a 1 story home with husband and son.       Prior Function   Vocation  Retired    [employer]  PLOF: better able to negotiate stairs,  stand up after prolonged sitting, lie down on her right side with minimal to no hip pain.       Observation/Other Assessments   Observations  (+) Ober's test both knee bent and straight for R side with reproduction of symptoms     Focus on Therapeutic Outcomes (FOTO)   Hip FOTO 48      Posture/Postural Control   Posture Comments  R tibia in ER       AROM   Lumbar Flexion  full    Lumbar Extension  full     Lumbar - Right Side Bend  WFL    Lumbar - Left Side Bend  WFL    Lumbar - Right Rotation  full     Lumbar - Left Rotation  full      Strength   Overall Strength Comments  prone manually resisted R hip ER in neutral 4-/5 with reproduction of posterior hip pain (pain along piriformis muscle)     Right Hip Flexion  4-/5 with R posterior hip pain    Right Hip Extension  4/5    Right Hip ABduction  4/5    Left Hip Flexion  4-/5    Left Hip Extension  4-/5    Right Knee Flexion  4/5    Right Knee Extension  5/5    Left Knee Flexion  4/5    Left Knee Extension  5/5      Palpation   Palpation comment  TTP R greater trochanter, glute med/min muscle, R piriformis       Ambulation/Gait   Gait Comments  contralateral pelvic drop during stance phase. Contralateral pelvic drop when stepping up and down steps, decreased bilateral femoral control                 Objective measurements completed on examination: See above findings.    Blood pressure L arm sitting, mechanically taken. 133/85, HR82  TTP R greater trochanter, glute med/min muscle   17/20 visits remain  Therapeutic exercise  Reviewed even  weight bearing bilateral LE to decrease lateral hip stress, sleeping on opposite side of hip pain with pillow between knees or supine position to decrease pressrue to R hip, as well as using egg shell crate mattress topper to decrease pressure to R lateral hip if pt ends up sleeping on her R side. Pt verbalized understanding.   Supine clamshell isometrics, 40% effort 1 min  with 1 min rest x3  Reviewed and given as part of her HEP. Pt demonstrated and verbalized understanding.     Improved exercise technique, movement at target joints, use of target muscles after mod verbal, visual, tactile cues.    Patient is a 62 year old female who came to physical therapy secondary to R hip pain. 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 Patient will benefit from skilled physical therapy services to address the aforementioned deficits.      PT Education - 05/06/18 1654    Education Details  Ther-ex, HEP, plan of care    Person(s) Educated  Patient    Methods  Explanation;Demonstration;Verbal cues;Handout;Tactile cues    Comprehension  Verbalized understanding;Returned demonstration          PT Long Term Goals - 05/06/18 1701      PT LONG TERM GOAL #1   Title  Patient will have a decrease in R hip pain to 3/10 or less at worst to promote ability to negotiate stairs as well as stand up from prolonged sitting more comfortably.     Baseline  7/10 R hip pain at worst (05/06/2018)    Time  8    Period  Weeks    Status  New    Target Date  07/03/18      PT LONG TERM GOAL #2   Title  Patient improve R glute med and max strength to promote ability to negotiate stairs as well as stand up from prolonged sitting more comfortably.     Time  8    Period  Weeks    Status  New    Target Date  07/03/18      PT LONG TERM GOAL #3   Title  Patient will improve her hip FOTO score by at least 10 points as a  demonstration of improved function.     Baseline  Hip FOTO 48 (05/06/2018)    Time  8    Period  Weeks    Status  New    Target Date  07/03/18             Plan - 05/06/18 1655    Clinical Impression Statement  Patient is a 62 year old female who came to physical therapy secondary to R hip pain. 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 Patient will benefit from skilled physical therapy services to address the aforementioned deficits.      History and Personal Factors relevant to plan of care:  Chronicity of condition, L knee and R hip pain,     Clinical Presentation  Stable    Clinical Presentation due to:  R hip pain does not seem to be worse since recent aggravation; minimal to no pain today.      Clinical Decision Making  Low    Rehab Potential  Good    Clinical Impairments Affecting Rehab Potential  (-) Chronicity of condition, pain. (+) motivated    PT Frequency  2x / week    PT Duration  8 weeks    PT Treatment/Interventions  Therapeutic activities;Therapeutic exercise;Neuromuscular re-education;Patient/family education;Manual techniques;Dry needling;Aquatic Therapy;Electrical Stimulation;Iontophoresis 4mg /ml Dexamethasone;Ultrasound    PT Next Visit Plan  isometrics, glute and trunk strengthening, manual techniques, modalities PRN    Consulted and Agree with Plan of Care  Patient       Patient will benefit from skilled therapeutic intervention in order to improve the following deficits and impairments:  Pain,  Improper body mechanics, Postural dysfunction, Decreased strength  Visit Diagnosis: Pain in right hip - Plan: PT plan of care cert/re-cert  Muscle weakness (generalized) - Plan: PT plan of care cert/re-cert     Problem List Patient Active Problem List   Diagnosis Date Noted  . Lesion of cervix 03/03/2018  . Cervicogenic headache 09/19/2017  . Allergic rhinitis 08/26/2017  . Arthritis 08/26/2017  . Hyperlipidemia 08/26/2017  . Hypertension 08/26/2017  . MS (multiple sclerosis) ([place]) 08/26/2017    Joneen Boers PT, DPT   05/06/2018, 8:00 PM  [facility] [address] [place], [address] Phone: [phone]   Fax:  [phone]  Name: Zoelle Markus MRN: [id number] Date of Birth: 04-Jun-1956

## 2018-05-06 NOTE — Patient Instructions (Signed)
Supine clamshell isometrics  Lying on your back on your bed (hips less than 90 degrees flexion)   Belt around thighs, which are shoulder width apart,    Press knees out against belt with 40 % effort for 1 minute   Rest for 1-2 minutes   Perform 5 times per set.     Do 3 sets per day. Each set to be performed a few hours apart.    Pain-free level of effort.

## 2018-05-08 ENCOUNTER — Ambulatory Visit: Payer: BLUE CROSS/BLUE SHIELD

## 2018-05-13 ENCOUNTER — Ambulatory Visit: Payer: BLUE CROSS/BLUE SHIELD

## 2018-05-20 ENCOUNTER — Ambulatory Visit: Payer: BLUE CROSS/BLUE SHIELD

## 2018-05-22 ENCOUNTER — Ambulatory Visit: Payer: BLUE CROSS/BLUE SHIELD

## 2018-05-27 ENCOUNTER — Ambulatory Visit: Payer: BLUE CROSS/BLUE SHIELD

## 2018-05-28 ENCOUNTER — Ambulatory Visit: Payer: BLUE CROSS/BLUE SHIELD

## 2018-06-17 ENCOUNTER — Ambulatory Visit: Payer: BLUE CROSS/BLUE SHIELD | Admitting: Family Medicine

## 2018-06-17 ENCOUNTER — Encounter: Payer: Self-pay | Admitting: Family Medicine

## 2018-06-17 VITALS — BP 126/85 | HR 80 | Temp 98.4°F | Resp 16 | Wt 175.0 lb

## 2018-06-17 DIAGNOSIS — I10 Essential (primary) hypertension: Secondary | ICD-10-CM | POA: Diagnosis not present

## 2018-06-17 MED ORDER — HYDROCHLOROTHIAZIDE 12.5 MG PO CAPS: 12.5000 mg | ORAL_CAPSULE | Freq: Every day | ORAL | 3 refills | Status: DC

## 2018-06-17 MED ORDER — LISINOPRIL 40 MG PO TABS: 40.0000 mg | ORAL_TABLET | Freq: Every day | ORAL | 3 refills | Status: DC

## 2018-06-17 NOTE — Progress Notes (Signed)
Patient: Stephanie Shah Female    DOB: September 06, 1956   62 y.o.   MRN: [id number] Visit Date: 06/17/2018  Today's Provider: Lelon Huh, MD   Chief Complaint  Patient presents with  . Follow-up  . Hypertension   Subjective:    HPI   Hypertension, follow-up:  BP Readings from Last 3 Encounters:  06/17/18 126/85  04/02/18 140/90  03/28/18 (!) 144/93    She was last seen for hypertension 4 months ago.  BP at that visit was 130/84. Management since that visit includes; no changes.She reports good compliance with treatment. She is not having side effects. none She is exercising. She is adherent to low salt diet.   Outside blood pressures are not checking. She is experiencing none.  Patient denies none.   Cardiovascular risk factors include none.  Use of agents associated with hypertension: none.   ---------------------------------------------------------------   Allergies  Allergen Reactions  . Penicillins Rash     Current Outpatient Medications:  .  aspirin EC 81 MG tablet, Take 81 mg by mouth daily., Disp: , Rfl:  .  atorvastatin (LIPITOR) 20 MG tablet, TAKE 1 TABLET BY MOUTH EVERY DAY, Disp: 90 tablet, Rfl: 2 .  Biotin 1000 MCG tablet, Take 1,000 mcg by mouth 3 (three) times daily., Disp: , Rfl:  .  butalbital-acetaminophen-caffeine (FIORICET, ESGIC) 50-325-40 MG tablet, Take by mouth 2 (two) times daily as needed for headache., Disp: , Rfl:  .  FLUoxetine (PROZAC) 40 MG capsule, TAKE 1 CAPSULE BY MOUTH EVERY DAY, Disp: 90 capsule, Rfl: 4 .  hydrochlorothiazide (MICROZIDE) 12.5 MG capsule, Take 12.5 mg by mouth daily., Disp: , Rfl:  .  lisinopril (PRINIVIL,ZESTRIL) 40 MG tablet, Take 40 mg by mouth daily., Disp: , Rfl:  .  Multiple Vitamin (MULTIVITAMIN) tablet, Take 1 tablet by mouth daily., Disp: , Rfl:  .  naproxen (NAPROSYN) 500 MG tablet, Take 1 tablet (500 mg total) by mouth 2 (two) times daily with a meal., Disp: 30 tablet, Rfl: 0  Review of Systems    Constitutional: Negative for appetite change, chills, fatigue and fever.  Respiratory: Negative for chest tightness and shortness of breath.   Cardiovascular: Negative for chest pain and palpitations.  Gastrointestinal: Negative for abdominal pain, nausea and vomiting.  Neurological: Negative for dizziness and weakness.    Social History   Tobacco Use  . Smoking status: Former Smoker    Types: Cigarettes    Last attempt to quit: 11/13/2003    Years since quitting: 14.6  . Smokeless tobacco: Never Used  Substance Use Topics  . Alcohol use: Yes    Alcohol/week: 4.2 - 8.4 oz    Types: 7 - 14 Glasses of wine per week    Comment: 1-2 glasses of wine per day   Objective:   BP 126/85 (BP Location: Right Arm, Patient Position: Sitting, Cuff Size: Normal)   Pulse 80   Temp 98.4 F (36.9 C) (Oral)   Resp 16   Wt 175 lb (79.4 kg)   SpO2 96%   BMI 33.07 kg/m     Physical Exam   General Appearance:    Alert, cooperative, no distress  Eyes:    PERRL, conjunctiva/corneas clear, EOM's intact       Lungs:     Clear to auscultation bilaterally, respirations unlabored  Heart:    Regular rate and rhythm  Neurologic:   Awake, alert, oriented x 3. No apparent focal neurological  defect.          Assessment & Plan:     1. Essential hypertension Well controlled.  Continue current medications.  Follow up for yearly CPE and labs in April 2020       Lelon Huh, MD  [facility]

## 2018-06-18 ENCOUNTER — Ambulatory Visit: Payer: BLUE CROSS/BLUE SHIELD | Attending: Orthopedic Surgery

## 2018-06-19 ENCOUNTER — Ambulatory Visit: Payer: BLUE CROSS/BLUE SHIELD | Admitting: Obstetrics and Gynecology

## 2018-06-19 ENCOUNTER — Encounter: Payer: Self-pay | Admitting: Obstetrics and Gynecology

## 2018-06-19 VITALS — BP 126/80 | HR 87 | Ht 61.0 in | Wt 176.0 lb

## 2018-06-19 DIAGNOSIS — N939 Abnormal uterine and vaginal bleeding, unspecified: Secondary | ICD-10-CM | POA: Diagnosis not present

## 2018-06-19 DIAGNOSIS — N840 Polyp of corpus uteri: Secondary | ICD-10-CM

## 2018-06-19 NOTE — Progress Notes (Signed)
Patient ID: Stephanie Shah, female   DOB: 1956/01/10, 62 y.o.   MRN: [id number]  Reason for Consult: Follow-up (No concerns, F/u from cervical leison )   Referred by Birdie Sons, MD  Subjective:     HPI:  Stephanie Shah is a 62 y.o. female . She is following up for monitoring of her cervix. She had a normal pap smear and a normal cervical biopsy several months ago. Since that time she reports a one time passage of a tan material with small blood specks into the toilet bowl. She denies any abnormal uterine bleeding or spotting. She is postmenopausal.   Past Medical History:  Diagnosis Date  . Hyperlipidemia   . Hypertension    Family History  Problem Relation Age of Onset  . Cancer Mother        pancreatic  . Hypertension Mother   . Alzheimer's disease Mother   . Heart attack Father        diet age 57  . Heart disease Maternal Grandfather   . Cancer Paternal Grandmother   . Dementia Paternal Grandmother   . Hypertension Sister   . Colon cancer Neg Hx   . Breast cancer Neg Hx   . Ovarian cancer Neg Hx   . Cervical cancer Neg Hx    Past Surgical History:  Procedure Laterality Date  . CESAREAN SECTION    . ROTATOR CUFF REPAIR Right   . TMJ ARTHROPLASTY    . TONSILLECTOMY    . TUBAL LIGATION      Short Social History:  Social History   Tobacco Use  . Smoking status: Former Smoker    Types: Cigarettes    Last attempt to quit: 11/13/2003    Years since quitting: 14.6  . Smokeless tobacco: Never Used  Substance Use Topics  . Alcohol use: Yes    Alcohol/week: 7.0 - 14.0 standard drinks    Types: 7 - 14 Glasses of wine per week    Comment: 1-2 glasses of wine per day    Allergies  Allergen Reactions  . Penicillins Rash    Current Outpatient Medications  Medication Sig Dispense Refill  . aspirin EC 81 MG tablet Take 81 mg by mouth daily.    Marland Kitchen atorvastatin (LIPITOR) 20 MG tablet TAKE 1 TABLET BY MOUTH EVERY DAY 90 tablet 2  . Biotin 1000 MCG tablet Take 1,000  mcg by mouth 3 (three) times daily.    . butalbital-acetaminophen-caffeine (FIORICET, ESGIC) 50-325-40 MG tablet Take by mouth 2 (two) times daily as needed for headache.    Marland Kitchen FLUoxetine (PROZAC) 40 MG capsule TAKE 1 CAPSULE BY MOUTH EVERY DAY 90 capsule 4  . hydrochlorothiazide (MICROZIDE) 12.5 MG capsule Take 1 capsule (12.5 mg total) by mouth daily. 90 capsule 3  . hydrocortisone 2.5 % cream hydrocortisone 2.5 % topical cream  APPLY THIN LAYER TO FACE TWICE A DAY AS NEEDED 1-2 WEEKS    . ketoconazole (NIZORAL) 2 % shampoo ketoconazole 2 % shampoo  3 TIMES A WEEK LATHER ON SCALP, LEAVE ON 8 TO 10 MINUTES, RINSE WELL    . lisinopril (PRINIVIL,ZESTRIL) 40 MG tablet Take 1 tablet (40 mg total) by mouth daily. 90 tablet 3  . metroNIDAZOLE (METROCREAM) 0.75 % cream metronidazole 0.75 % topical cream  1 APPLICATION THIN LAYER TO FACE TWICE A DAY    . Multiple Vitamin (MULTIVITAMIN) tablet Take 1 tablet by mouth daily.    . naproxen (NAPROSYN) 500 MG tablet Take 1 tablet (500 mg  total) by mouth 2 (two) times daily with a meal. 30 tablet 0  . triamcinolone lotion (KENALOG) 0.1 % 1 APPLICATION APPLY TO THE SCALP TWICE A DAY AS NEEDED AS NEEDED NOT TO FACE, GROIN OR UNDERARMS  2   No current facility-administered medications for this visit.     Review of Systems  Constitutional: Negative for chills, fatigue, fever and unexpected weight change.  HENT: Negative for trouble swallowing.  Eyes: Negative for loss of vision.  Respiratory: Negative for cough, shortness of breath and wheezing.  Cardiovascular: Negative for chest pain, leg swelling, palpitations and syncope.  GI: Negative for abdominal pain, blood in stool, diarrhea, nausea and vomiting.  GU: Negative for difficulty urinating, dysuria, frequency and hematuria.  Musculoskeletal: Negative for back pain, leg pain and joint pain.  Skin: Negative for rash.  Neurological: Negative for dizziness, headaches, light-headedness, numbness and  seizures.  Psychiatric: Negative for behavioral problem, confusion, depressed mood and sleep disturbance.        Objective:  Objective   Vitals:   06/19/18 0835  BP: 126/80  Pulse: 87  Weight: 176 lb (79.8 kg)  Height: 5\' 1"  (1.549 m)   Body mass index is 33.25 kg/m.  Physical Exam  Constitutional: She is oriented to person, place, and time. She appears well-developed and well-nourished.  HENT:  Head: Normocephalic and atraumatic.  Eyes: Pupils are equal, round, and reactive to light. EOM are normal.  Cardiovascular: Normal rate and regular rhythm.  Pulmonary/Chest: Effort normal. No respiratory distress.  Genitourinary:  Genitourinary Comments: Unchanged cervical appearance.   Neurological: She is alert and oriented to person, place, and time.  Skin: Skin is warm and dry.  Psychiatric: She has a normal mood and affect. Her behavior is normal. Judgment and thought content normal.  Nursing note and vitals reviewed.       Assessment/Plan:    62 yo  1. Abnormal cervical appearance.- Stable today, continue with yearly monitoring. Can repeat pap smear in 1 year.  2.  Reports passage of tan/ bloody material at home. May of been a uterine polyp. Will obtain pelvic US.   More than 25 minutes were spent face to face with the patient in the room with more than 50% of the time spent providing counseling and discussing the plan of management. We discussed her plan of managment.    Adrian Prows MD [facility] 06/19/18 9:08 AM

## 2018-06-23 ENCOUNTER — Telehealth: Payer: Self-pay | Admitting: Obstetrics and Gynecology

## 2018-06-23 NOTE — Telephone Encounter (Signed)
Patient needs to schedule ultrasound and f/u around 07/17/18, per Dr. Gennette Pac note. Lmtrc.

## 2018-06-24 ENCOUNTER — Ambulatory Visit: Payer: BLUE CROSS/BLUE SHIELD

## 2018-06-26 ENCOUNTER — Ambulatory Visit: Payer: BLUE CROSS/BLUE SHIELD

## 2018-07-01 ENCOUNTER — Ambulatory Visit: Payer: BLUE CROSS/BLUE SHIELD

## 2018-07-18 ENCOUNTER — Telehealth: Payer: Self-pay | Admitting: Obstetrics and Gynecology

## 2018-07-18 ENCOUNTER — Encounter: Payer: Self-pay | Admitting: Obstetrics and Gynecology

## 2018-07-18 ENCOUNTER — Ambulatory Visit (INDEPENDENT_AMBULATORY_CARE_PROVIDER_SITE_OTHER): Payer: BLUE CROSS/BLUE SHIELD

## 2018-07-18 ENCOUNTER — Ambulatory Visit (INDEPENDENT_AMBULATORY_CARE_PROVIDER_SITE_OTHER): Payer: BLUE CROSS/BLUE SHIELD | Admitting: Obstetrics and Gynecology

## 2018-07-18 VITALS — BP 122/76 | HR 85 | Ht 61.0 in | Wt 176.0 lb

## 2018-07-18 DIAGNOSIS — R9389 Abnormal findings on diagnostic imaging of other specified body structures: Secondary | ICD-10-CM

## 2018-07-18 DIAGNOSIS — N889 Noninflammatory disorder of cervix uteri, unspecified: Secondary | ICD-10-CM | POA: Diagnosis not present

## 2018-07-18 DIAGNOSIS — N840 Polyp of corpus uteri: Secondary | ICD-10-CM

## 2018-07-18 NOTE — Telephone Encounter (Signed)
Patient is aware of Pre-admit Testing phone interview to be scheduled, and surgery on 07/22/18. Patient is aware she may receive calls from the [facility] and [facility]. Per patient, she has a new UMR/UHC policy, but it will not upload until today, and the policy should be effective 07/13/18. Per patient, [employer] policy was Deanna Artis so it ends as soon as the new policy begins. Patient is aware UMR./UHC will need to upload before prior auth can be obtained, if required. Patient would like to know how many days she will need for recovery.

## 2018-07-18 NOTE — Telephone Encounter (Signed)
-----   Message from Homero Fellers, MD sent at 07/18/2018  9:22 AM EDT ----- Surgery Booking Request Patient Full Name:  Stephanie Shah  MRN: [id number]  DOB: 07/01/1956  Surgeon: Homero Fellers, MD  Requested Surgery Date and Time: 07/22/18 Primary Diagnosis AND Code: thickened endometrium, cervical stenosis Secondary Diagnosis and Code:  Surgical Procedure: hysteroscopy dilation and curettage L&D Notification: No Admission Status: same day surgery Length of Surgery: 1 hour Special Case Needs: none H&P: no needed, signed consents today in office (date) Phone Interview???: yes Interpreter: Language:  Medical Clearance: none Special Scheduling Instructions: We discusseded 07/22/18 as a possible surgery day.

## 2018-07-18 NOTE — H&P (View-Only) (Signed)
Patient ID: Stephanie Shah, female   DOB: 1956/08/20, 62 y.o.   MRN: [id number]  Reason for Consult: Follow-up (U/S f/u)   Referred by Birdie Sons, MD  Subjective:     HPI:  Stephanie Shah is a 62 y.o. female  She is following up today for a pelvic US after passage of a tan substance. She has not had any further symptoms. She has not had abnormal vaginal bleeding. She has no complaints today and is feeling well.   Past Medical History:  Diagnosis Date  . Hyperlipidemia   . Hypertension    Family History  Problem Relation Age of Onset  . Cancer Mother        pancreatic  . Hypertension Mother   . Alzheimer's disease Mother   . Heart attack Father        diet age 65  . Heart disease Maternal Grandfather   . Cancer Paternal Grandmother   . Dementia Paternal Grandmother   . Hypertension Sister   . Colon cancer Neg Hx   . Breast cancer Neg Hx   . Ovarian cancer Neg Hx   . Cervical cancer Neg Hx    Past Surgical History:  Procedure Laterality Date  . CESAREAN SECTION    . ROTATOR CUFF REPAIR Right   . TMJ ARTHROPLASTY    . TONSILLECTOMY    . TUBAL LIGATION      Short Social History:  Social History   Tobacco Use  . Smoking status: Former Smoker    Types: Cigarettes    Last attempt to quit: 11/13/2003    Years since quitting: 14.6  . Smokeless tobacco: Never Used  Substance Use Topics  . Alcohol use: Yes    Alcohol/week: 7.0 - 14.0 standard drinks    Types: 7 - 14 Glasses of wine per week    Comment: 1-2 glasses of wine per day    Allergies  Allergen Reactions  . Penicillins Rash    Has patient had a PCN reaction causing immediate rash, facial/tongue/throat swelling, SOB or lightheadedness with hypotension: Yes Has patient had a PCN reaction causing severe rash involving mucus membranes or skin necrosis: No Has patient had a PCN reaction that required hospitalization: No Has patient had a PCN reaction occurring within the last 10 years: No If all of the above  answers are "NO", then may proceed with Cephalosporin use.     Current Outpatient Medications  Medication Sig Dispense Refill  . aspirin EC 81 MG tablet Take 81 mg by mouth daily.    Marland Kitchen atorvastatin (LIPITOR) 20 MG tablet TAKE 1 TABLET BY MOUTH EVERY DAY 90 tablet 2  . BIOTIN PO Take 500 mcg by mouth daily.     . butalbital-acetaminophen-caffeine (FIORICET, ESGIC) 50-325-40 MG tablet Take 1 tablet by mouth every 6 (six) hours as needed for headache.     Marland Kitchen FLUoxetine (PROZAC) 40 MG capsule TAKE 1 CAPSULE BY MOUTH EVERY DAY 90 capsule 4  . hydrochlorothiazide (MICROZIDE) 12.5 MG capsule Take 1 capsule (12.5 mg total) by mouth daily. 90 capsule 3  . ketoconazole (NIZORAL) 2 % shampoo Apply 1 application topically once a week.     Marland Kitchen lisinopril (PRINIVIL,ZESTRIL) 40 MG tablet Take 1 tablet (40 mg total) by mouth daily. 90 tablet 3  . metroNIDAZOLE (METROCREAM) 0.75 % cream Apply 1 application topically daily.     . Multiple Vitamin (MULTIVITAMIN) tablet Take 1 tablet by mouth daily.    . naproxen (NAPROSYN) 500 MG tablet Take  1 tablet (500 mg total) by mouth 2 (two) times daily with a meal. (Patient taking differently: Take 500 mg by mouth daily. ) 30 tablet 0   No current facility-administered medications for this visit.     Review of Systems  Constitutional: Negative for chills, fatigue, fever and unexpected weight change.  HENT: Negative for trouble swallowing.  Eyes: Negative for loss of vision.  Respiratory: Negative for cough, shortness of breath and wheezing.  Cardiovascular: Negative for chest pain, leg swelling, palpitations and syncope.  GI: Negative for abdominal pain, blood in stool, diarrhea, nausea and vomiting.  GU: Negative for difficulty urinating, dysuria, frequency and hematuria.  Musculoskeletal: Negative for back pain, leg pain and joint pain.  Skin: Negative for rash.  Neurological: Negative for dizziness, headaches, light-headedness, numbness and seizures.    Psychiatric: Negative for behavioral problem, confusion, depressed mood and sleep disturbance.        Objective:  Objective   Vitals:   07/18/18 0832  BP: 122/76  Pulse: 85  Weight: 176 lb (79.8 kg)  Height: 5\' 1"  (1.549 m)   Body mass index is 33.25 kg/m.  Physical Exam  Constitutional: She is oriented to person, place, and time. She appears well-developed and well-nourished.  HENT:  Head: Normocephalic and atraumatic.  Eyes: Pupils are equal, round, and reactive to light. EOM are normal.  Cardiovascular: Normal rate and regular rhythm.  Pulmonary/Chest: Effort normal. No respiratory distress.  Genitourinary:  Genitourinary Comments: Stable cervical appearance. Cervical canal stenosis, failed endometrial biopsy.   Neurological: She is alert and oriented to person, place, and time.  Skin: Skin is warm and dry.  Psychiatric: She has a normal mood and affect. Her behavior is normal. Judgment and thought content normal.  Nursing note and vitals reviewed.  Endometrial Biopsy After discussion with the patient regarding her abnormal uterine bleeding I recommended that she proceed with an endometrial biopsy for further diagnosis. The risks, benefits, alternatives, and indications for an endometrial biopsy were discussed with the patient in detail. She understood the risks including infection, bleeding, cervical laceration and uterine perforation.  Verbal consent was obtained.   PROCEDURE NOTE:  Pipelle endometrial biopsy was performed using aseptic technique with iodine preparation.  Tenaculum applied to the cervix. Pipelle could not be passed through the cervical canal.   Procedure discontinued. Tenaculum removed, hemostatic tenaculum sites.        Assessment/Plan:    62 yo with thickened endometrium on today's ultrasound.  Failed in office endometrial biopsy Will take to the OR for hysteroscopy dilation and curettage.   More than 30 minutes were spent face to face with the  patient in the room with more than 50% of the time spent providing counseling and discussing the plan of management. We discussed the results of her endometrium, possible etiologies of the thickened endometrium, the surgery, surgical risks and complications.   Adrian Prows MD [facility], [facility] 07/18/18 8:40 PM

## 2018-07-18 NOTE — Telephone Encounter (Signed)
1 week will be sufficient

## 2018-07-18 NOTE — Progress Notes (Signed)
Patient ID: Stephanie Shah, female   DOB: 12-Aug-1956, 62 y.o.   MRN: [id number]  Reason for Consult: Follow-up (U/S f/u)   Referred by Birdie Sons, MD  Subjective:     HPI:  Stephanie Shah is a 62 y.o. female  She is following up today for a pelvic US after passage of a tan substance. She has not had any further symptoms. She has not had abnormal vaginal bleeding. She has no complaints today and is feeling well.   Past Medical History:  Diagnosis Date  . Hyperlipidemia   . Hypertension    Family History  Problem Relation Age of Onset  . Cancer Mother        pancreatic  . Hypertension Mother   . Alzheimer's disease Mother   . Heart attack Father        diet age 61  . Heart disease Maternal Grandfather   . Cancer Paternal Grandmother   . Dementia Paternal Grandmother   . Hypertension Sister   . Colon cancer Neg Hx   . Breast cancer Neg Hx   . Ovarian cancer Neg Hx   . Cervical cancer Neg Hx    Past Surgical History:  Procedure Laterality Date  . CESAREAN SECTION    . ROTATOR CUFF REPAIR Right   . TMJ ARTHROPLASTY    . TONSILLECTOMY    . TUBAL LIGATION      Short Social History:  Social History   Tobacco Use  . Smoking status: Former Smoker    Types: Cigarettes    Last attempt to quit: 11/13/2003    Years since quitting: 14.6  . Smokeless tobacco: Never Used  Substance Use Topics  . Alcohol use: Yes    Alcohol/week: 7.0 - 14.0 standard drinks    Types: 7 - 14 Glasses of wine per week    Comment: 1-2 glasses of wine per day    Allergies  Allergen Reactions  . Penicillins Rash    Has patient had a PCN reaction causing immediate rash, facial/tongue/throat swelling, SOB or lightheadedness with hypotension: Yes Has patient had a PCN reaction causing severe rash involving mucus membranes or skin necrosis: No Has patient had a PCN reaction that required hospitalization: No Has patient had a PCN reaction occurring within the last 10 years: No If all of the above  answers are "NO", then may proceed with Cephalosporin use.     Current Outpatient Medications  Medication Sig Dispense Refill  . aspirin EC 81 MG tablet Take 81 mg by mouth daily.    Marland Kitchen atorvastatin (LIPITOR) 20 MG tablet TAKE 1 TABLET BY MOUTH EVERY DAY 90 tablet 2  . BIOTIN PO Take 500 mcg by mouth daily.     . butalbital-acetaminophen-caffeine (FIORICET, ESGIC) 50-325-40 MG tablet Take 1 tablet by mouth every 6 (six) hours as needed for headache.     Marland Kitchen FLUoxetine (PROZAC) 40 MG capsule TAKE 1 CAPSULE BY MOUTH EVERY DAY 90 capsule 4  . hydrochlorothiazide (MICROZIDE) 12.5 MG capsule Take 1 capsule (12.5 mg total) by mouth daily. 90 capsule 3  . ketoconazole (NIZORAL) 2 % shampoo Apply 1 application topically once a week.     Marland Kitchen lisinopril (PRINIVIL,ZESTRIL) 40 MG tablet Take 1 tablet (40 mg total) by mouth daily. 90 tablet 3  . metroNIDAZOLE (METROCREAM) 0.75 % cream Apply 1 application topically daily.     . Multiple Vitamin (MULTIVITAMIN) tablet Take 1 tablet by mouth daily.    . naproxen (NAPROSYN) 500 MG tablet Take  1 tablet (500 mg total) by mouth 2 (two) times daily with a meal. (Patient taking differently: Take 500 mg by mouth daily. ) 30 tablet 0   No current facility-administered medications for this visit.     Review of Systems  Constitutional: Negative for chills, fatigue, fever and unexpected weight change.  HENT: Negative for trouble swallowing.  Eyes: Negative for loss of vision.  Respiratory: Negative for cough, shortness of breath and wheezing.  Cardiovascular: Negative for chest pain, leg swelling, palpitations and syncope.  GI: Negative for abdominal pain, blood in stool, diarrhea, nausea and vomiting.  GU: Negative for difficulty urinating, dysuria, frequency and hematuria.  Musculoskeletal: Negative for back pain, leg pain and joint pain.  Skin: Negative for rash.  Neurological: Negative for dizziness, headaches, light-headedness, numbness and seizures.    Psychiatric: Negative for behavioral problem, confusion, depressed mood and sleep disturbance.        Objective:  Objective   Vitals:   07/18/18 0832  BP: 122/76  Pulse: 85  Weight: 176 lb (79.8 kg)  Height: 5\' 1"  (1.549 m)   Body mass index is 33.25 kg/m.  Physical Exam  Constitutional: She is oriented to person, place, and time. She appears well-developed and well-nourished.  HENT:  Head: Normocephalic and atraumatic.  Eyes: Pupils are equal, round, and reactive to light. EOM are normal.  Cardiovascular: Normal rate and regular rhythm.  Pulmonary/Chest: Effort normal. No respiratory distress.  Genitourinary:  Genitourinary Comments: Stable cervical appearance. Cervical canal stenosis, failed endometrial biopsy.   Neurological: She is alert and oriented to person, place, and time.  Skin: Skin is warm and dry.  Psychiatric: She has a normal mood and affect. Her behavior is normal. Judgment and thought content normal.  Nursing note and vitals reviewed.  Endometrial Biopsy After discussion with the patient regarding her abnormal uterine bleeding I recommended that she proceed with an endometrial biopsy for further diagnosis. The risks, benefits, alternatives, and indications for an endometrial biopsy were discussed with the patient in detail. She understood the risks including infection, bleeding, cervical laceration and uterine perforation.  Verbal consent was obtained.   PROCEDURE NOTE:  Pipelle endometrial biopsy was performed using aseptic technique with iodine preparation.  Tenaculum applied to the cervix. Pipelle could not be passed through the cervical canal.   Procedure discontinued. Tenaculum removed, hemostatic tenaculum sites.        Assessment/Plan:    62 yo with thickened endometrium on today's ultrasound.  Failed in office endometrial biopsy Will take to the OR for hysteroscopy dilation and curettage.   More than 30 minutes were spent face to face with the  patient in the room with more than 50% of the time spent providing counseling and discussing the plan of management. We discussed the results of her endometrium, possible etiologies of the thickened endometrium, the surgery, surgical risks and complications.   Adrian Prows MD [facility], [facility] 07/18/18 8:40 PM

## 2018-07-21 ENCOUNTER — Encounter
Admission: RE | Admit: 2018-07-21 | Discharge: 2018-07-21 | Disposition: A | Discharge status: Home / Self Care | Payer: 59 | Source: Ambulatory Visit | Attending: Obstetrics and Gynecology | Admitting: Obstetrics and Gynecology

## 2018-07-21 ENCOUNTER — Other Ambulatory Visit: Payer: Self-pay

## 2018-07-21 HISTORY — DX: Unspecified osteoarthritis, unspecified site: M19.90

## 2018-07-21 NOTE — Pre-Procedure Instructions (Signed)
Collapse All 2019 May NM myocardial perfusion SPECT multiple (stress and rest)03/26/2018 [place] Result Impression   1.Normal left ventricular function 2.Normal wall motion 3.No evidence for scar or ischemia  Other Result Information  This result has an attachment that is not available.  Result Narrative  CARDIOLOGY DEPARTMENT [facility] [address] [place], [id number] [phone]  Procedure: Pharmacologic Myocardial Perfusion Imaging ONE day procedure  Indication: Chest pain with high risk for cardiac etiology, unspecified Plan: NM myocardial perfusion SPECT multiple (stress  and rest), ECG stress test only  Ordering Physician:   Dr. Isaias Cowman   Clinical History: 62 y.o. year old female Vitals: Height: 61 in Weight: 176 lb Cardiac risk factors include:  Hyperlipidemia and HTN    Procedure:  Pharmacologic stress testing was performed with Regadenoson using a single  use 0.4mg /57ml (0.08 mg/ml) prefilled syringe intravenously infused as a  bolus dose. The stress test was stopped due to Infusion completion.Blood  pressure response was normal. The patient did not develop any symptoms  other than fatigue during the procedure.   Rest HR: 82bpm Rest BP: 140/74mmHg Max HR: 113bpm Min BP: 138/28mmHg Stress Test Administered by: Oswald Hillock, CMA ECG Interpretation: Rest WNU:UVOZDG sinus rhythm, none Stress UYQ:IHKVQQ sinus rhythm,  Recovery VZD:GLOVFI sinus rhythm ECG Interpretation:negative.   Administrations This Visit  regadenoson (LEXISCAN) 0.4 mg/5 mL inj syringe 0.4 mg  Admin Date 03/24/2018 Action Given Dose 0.4 mg Route Intravenous Administered By Herbert Seta, CNMT     technetium Tc30m sestamibi (CARDIOLITE) injection 43.32 millicurie  Admin Date 95/18/8416 Action Given Dose 60.63 millicurie Route Intravenous Administered By Herbert Seta, CNMT       Gated post-stress perfusion imaging was performed 30 minutes after stress.  Rest images were performed 30 minutes after injection.  Gated LV Analysis:  TID Ratio: 1.14  LVEF= 81%%  FINDINGS: Regional wall motion:reveals normal myocardial thickening and wall  motion. The overall quality of the study is good. Artifacts noted: no Left ventricular cavity: normal.  Perfusion Analysis:SPECT images demonstrate homogeneous tracer  distribution throughout the myocardium.  Status

## 2018-07-21 NOTE — Patient Instructions (Signed)
Your procedure is scheduled on: 07/22/09  0930 Report to Day Surgery. [facility] SECOND FLOOR   Remember: Instructions that are not followed completely may result in serious medical risk,  up to and including death, or upon the discretion of your surgeon and anesthesiologist your  surgery may need to be rescheduled.     _X__ 1. Do not eat food after midnight the night before your procedure.                 No gum chewing or hard candies. You may drink clear liquids up to 2 hours                 before you are scheduled to arrive for your surgery- DO not drink clear                 liquids within 2 hours of the start of your surgery.                 Clear Liquids include:  water, apple juice without pulp, clear carbohydrate                 drink such as Clearfast of Gatorade, Black Coffee or Tea (Do not add                 anything to coffee or tea).  __X__2.  On the morning of surgery brush your teeth with toothpaste and water, you                may rinse your mouth with mouthwash if you wish.  Do not swallow any toothpaste of mouthwash.     _X__ 3.  No Alcohol for 24 hours before or after surgery.   _X__ 4.  Do Not Smoke or use e-cigarettes For 24 Hours Prior to Your Surgery.                 Do not use any chewable tobacco products for at least 6 hours prior to                 surgery.  ____  5.  Bring all medications with you on the day of surgery if instructed.   _X___  6.  Notify your doctor if there is any change in your medical condition      (cold, fever, infections).     Do not wear jewelry, make-up, hairpins, clips or nail polish. Do not wear lotions, powders, or perfumes. You may wear deodorant. Do not shave 48 hours prior to surgery. Men may shave face and neck. Do not bring valuables to the hospital.    [facility] is not responsible for any belongings or valuables.  Contacts, dentures or bridgework may not be worn into surgery. Leave your  suitcase in the car. After surgery it may be brought to your room. For patients admitted to the hospital, discharge time is determined by your treatment team.   Patients discharged the day of surgery will not be allowed to drive home.   _X___ Take these medicines the morning of surgery with A SIP OF WATER:    1. ATORVASTATIN  2. FLUOXETINE  3.   4.  5.  6.  ____ Fleet Enema (as directed)   ____ Use CHG Soap as directed  ____ Use inhalers on the day of surgery  ____ Stop metformin 2 days prior to surgery    ____ Take 1/2 of usual insulin dose the night before surgery. No  insulin the morning          of surgery.   ____ Stop Coumadin/Plavix/aspirin on   ____ Stop Anti-inflammatories on    NO MORE NAPROXEN  UNTIL AFTER SURGERY   ____ Stop supplements until after surgery.    ____ Bring C-Pap to the hospital.

## 2018-07-22 ENCOUNTER — Encounter
Admission: RE | Disposition: A | Discharge status: Home / Self Care | Payer: Self-pay | Source: Ambulatory Visit | Attending: Obstetrics and Gynecology

## 2018-07-22 ENCOUNTER — Encounter: Payer: Self-pay | Admitting: *Deleted

## 2018-07-22 ENCOUNTER — Ambulatory Visit: Payer: 59 | Admitting: Anesthesiology

## 2018-07-22 ENCOUNTER — Other Ambulatory Visit: Payer: Self-pay

## 2018-07-22 ENCOUNTER — Ambulatory Visit
Admission: RE | Admit: 2018-07-22 | Discharge: 2018-07-22 | Disposition: A | Discharge status: Home / Self Care | Payer: 59 | Source: Ambulatory Visit | Attending: Obstetrics and Gynecology | Admitting: Obstetrics and Gynecology

## 2018-07-22 DIAGNOSIS — E785 Hyperlipidemia, unspecified: Secondary | ICD-10-CM | POA: Diagnosis not present

## 2018-07-22 DIAGNOSIS — I10 Essential (primary) hypertension: Secondary | ICD-10-CM | POA: Insufficient documentation

## 2018-07-22 DIAGNOSIS — Z7982 Long term (current) use of aspirin: Secondary | ICD-10-CM | POA: Diagnosis not present

## 2018-07-22 DIAGNOSIS — N939 Abnormal uterine and vaginal bleeding, unspecified: Secondary | ICD-10-CM | POA: Diagnosis not present

## 2018-07-22 DIAGNOSIS — Z87891 Personal history of nicotine dependence: Secondary | ICD-10-CM | POA: Insufficient documentation

## 2018-07-22 DIAGNOSIS — Z79899 Other long term (current) drug therapy: Secondary | ICD-10-CM | POA: Insufficient documentation

## 2018-07-22 DIAGNOSIS — N882 Stricture and stenosis of cervix uteri: Secondary | ICD-10-CM | POA: Diagnosis not present

## 2018-07-22 DIAGNOSIS — R9389 Abnormal findings on diagnostic imaging of other specified body structures: Secondary | ICD-10-CM | POA: Insufficient documentation

## 2018-07-22 HISTORY — PX: DILATATION & CURETTAGE/HYSTEROSCOPY WITH MYOSURE: SHX6511

## 2018-07-22 LAB — CBC
HCT: 41.1 % (ref 35.0–47.0)
HEMOGLOBIN: 14 g/dL (ref 12.0–16.0)
MCH: 31.5 pg (ref 26.0–34.0)
MCHC: 34.2 g/dL (ref 32.0–36.0)
MCV: 92.1 fL (ref 80.0–100.0)
Platelets: 336 10*3/uL (ref 150–440)
RBC: 4.47 MIL/uL (ref 3.80–5.20)
RDW: 13.6 % (ref 11.5–14.5)
WBC: 7.7 10*3/uL (ref 3.6–11.0)

## 2018-07-22 LAB — POCT I-STAT 4, (NA,K, GLUC, HGB,HCT)
Glucose, Bld: 96 mg/dL (ref 70–99)
HCT: 42 % (ref 36.0–46.0)
Hemoglobin: 14.3 g/dL (ref 12.0–15.0)
POTASSIUM: 4 mmol/L (ref 3.5–5.1)
SODIUM: 138 mmol/L (ref 135–145)

## 2018-07-22 LAB — TYPE AND SCREEN
ABO/RH(D): A POS
Antibody Screen: NEGATIVE

## 2018-07-22 SURGERY — DILATATION & CURETTAGE/HYSTEROSCOPY WITH MYOSURE
Anesthesia: General

## 2018-07-22 MED ORDER — KETOROLAC TROMETHAMINE 30 MG/ML IJ SOLN
INTRAMUSCULAR | Status: AC
  Filled 2018-07-22: qty 1

## 2018-07-22 MED ORDER — LIDOCAINE HCL (PF) 2 % IJ SOLN
INTRAMUSCULAR | Status: AC
  Filled 2018-07-22: qty 10

## 2018-07-22 MED ORDER — ACETAMINOPHEN 500 MG PO TABS: 1000.0000 mg | ORAL_TABLET | Freq: Four times a day (QID) | ORAL | 0 refills | Status: AC | PRN

## 2018-07-22 MED ORDER — MIDAZOLAM HCL 2 MG/2ML IJ SOLN
INTRAMUSCULAR | Status: DC | PRN
  Administered 2018-07-22: 2 mg via INTRAVENOUS

## 2018-07-22 MED ORDER — FAMOTIDINE 20 MG PO TABS
ORAL_TABLET | ORAL | Status: AC
  Filled 2018-07-22: qty 1

## 2018-07-22 MED ORDER — FAMOTIDINE 20 MG PO TABS
20.0000 mg | ORAL_TABLET | Freq: Once | ORAL | Status: AC
  Administered 2018-07-22: 20 mg via ORAL

## 2018-07-22 MED ORDER — MIDAZOLAM HCL 2 MG/2ML IJ SOLN
INTRAMUSCULAR | Status: AC
  Filled 2018-07-22: qty 2

## 2018-07-22 MED ORDER — FENTANYL CITRATE (PF) 100 MCG/2ML IJ SOLN
INTRAMUSCULAR | Status: DC | PRN
  Administered 2018-07-22: 25 ug via INTRAVENOUS

## 2018-07-22 MED ORDER — LACTATED RINGERS IV SOLN
INTRAVENOUS | Status: DC
  Administered 2018-07-22: 11:00:00 via INTRAVENOUS

## 2018-07-22 MED ORDER — LACTATED RINGERS IV SOLN: INTRAVENOUS | Status: DC

## 2018-07-22 MED ORDER — DEXAMETHASONE SODIUM PHOSPHATE 10 MG/ML IJ SOLN
INTRAMUSCULAR | Status: DC | PRN
  Administered 2018-07-22: 5 mg via INTRAVENOUS

## 2018-07-22 MED ORDER — ONDANSETRON HCL 4 MG/2ML IJ SOLN
INTRAMUSCULAR | Status: AC
  Filled 2018-07-22: qty 2

## 2018-07-22 MED ORDER — SEVOFLURANE IN SOLN
RESPIRATORY_TRACT | Status: AC
  Filled 2018-07-22: qty 250

## 2018-07-22 MED ORDER — PROPOFOL 10 MG/ML IV BOLUS
INTRAVENOUS | Status: DC | PRN
  Administered 2018-07-22: 40 mg via INTRAVENOUS
  Administered 2018-07-22: 150 mg via INTRAVENOUS
  Administered 2018-07-22: 60 mg via INTRAVENOUS

## 2018-07-22 MED ORDER — IBUPROFEN 800 MG PO TABS: 800.0000 mg | ORAL_TABLET | Freq: Three times a day (TID) | ORAL | 0 refills | Status: DC | PRN

## 2018-07-22 MED ORDER — FENTANYL CITRATE (PF) 100 MCG/2ML IJ SOLN
INTRAMUSCULAR | Status: AC
  Filled 2018-07-22: qty 2

## 2018-07-22 MED ORDER — DEXAMETHASONE SODIUM PHOSPHATE 10 MG/ML IJ SOLN
INTRAMUSCULAR | Status: AC
  Filled 2018-07-22: qty 1

## 2018-07-22 MED ORDER — ONDANSETRON HCL 4 MG/2ML IJ SOLN
INTRAMUSCULAR | Status: DC | PRN
  Administered 2018-07-22: 4 mg via INTRAVENOUS

## 2018-07-22 MED ORDER — KETOROLAC TROMETHAMINE 30 MG/ML IJ SOLN
INTRAMUSCULAR | Status: DC | PRN
  Administered 2018-07-22: 30 mg via INTRAVENOUS

## 2018-07-22 SURGICAL SUPPLY — 24 items
BAG COUNTER SPONGE EZ (MISCELLANEOUS) ×2 IMPLANT
CANISTER SUC SOCK COL 7IN (MISCELLANEOUS) ×3 IMPLANT
CATH ROBINSON RED A/P 16FR (CATHETERS) ×3 IMPLANT
COUNTER SPONGE BAG EZ (MISCELLANEOUS) ×1
DEVICE MYOSURE LITE (MISCELLANEOUS) IMPLANT
DEVICE MYOSURE REACH (MISCELLANEOUS) IMPLANT
ELECT REM PT RETURN 9FT ADLT (ELECTROSURGICAL) ×3
ELECTRODE REM PT RTRN 9FT ADLT (ELECTROSURGICAL) ×1 IMPLANT
GLOVE BIO SURGEON STRL SZ 6.5 (GLOVE) ×2 IMPLANT
GLOVE BIO SURGEONS STRL SZ 6.5 (GLOVE) ×1
GLOVE BIOGEL PI IND STRL 6.5 (GLOVE) ×1 IMPLANT
GLOVE BIOGEL PI INDICATOR 6.5 (GLOVE) ×2
GOWN STRL REUS W/ TWL LRG LVL3 (GOWN DISPOSABLE) ×2 IMPLANT
GOWN STRL REUS W/TWL LRG LVL3 (GOWN DISPOSABLE) ×4
KIT PROCEDURE FLUENT (KITS) ×3 IMPLANT
PACK DNC HYST (MISCELLANEOUS) ×3 IMPLANT
PAD OB MATERNITY 4.3X12.25 (PERSONAL CARE ITEMS) ×3 IMPLANT
PAD PREP 24X41 OB/GYN DISP (PERSONAL CARE ITEMS) ×3 IMPLANT
SOL .9 NS 3000ML IRR  AL (IV SOLUTION) ×2
SOL .9 NS 3000ML IRR UROMATIC (IV SOLUTION) ×1 IMPLANT
STRAP SAFETY 5IN WIDE (MISCELLANEOUS) ×3 IMPLANT
TOWEL OR 17X26 4PK STRL BLUE (TOWEL DISPOSABLE) ×3 IMPLANT
TUBING CONNECTING 10 (TUBING) ×2 IMPLANT
TUBING CONNECTING 10' (TUBING) ×1

## 2018-07-22 NOTE — Anesthesia Post-op Follow-up Note (Signed)
Anesthesia QCDR form completed.        

## 2018-07-22 NOTE — Op Note (Signed)
Operative Note  07/22/2018  PRE-OP DIAGNOSIS: Thickened Endometrium  POST-OP DIAGNOSIS: same   SURGEON: Adrian Prows, MD  PROCEDURE: Procedure(s): DILATATION & CURETTAGE/HYSTEROSCOPY   ANESTHESIA: Choice   ESTIMATED BLOOD LOSS: less than 20cc   SPECIMENS:  Endometrial curettings  FLUID DEFICIT: 500 cc  COMPLICATIONS: None  DISPOSITION: PACU - hemodynamically stable.  CONDITION: stable  FINDINGS: Exam under anesthesia revealed small, mobile 9cm uterus with no masses and bilateral adnexa without masses or fullness. Hysteroscopy revealed a  grossly normal appearing uterine cavity with bilateral tubal ostia and normal appearing endocervical canal. No fibroids or polyps seen.   PROCEDURE IN DETAIL: After informed consent was obtained, the patient was taken to the operating room where anesthesia was obtained without difficulty. The patient was positioned in the dorsal lithotomy position in [place]. The patient's bladder was catheterized with an in and out foley catheter. The patient was examined under anesthesia, with the above noted findings. The weighted speculum was placed inside the patient's vagina, and the the anterior lip of the cervix was seen and grasped with the tenaculum.   The uterine cavity was sounded to 7cm, and then the cervix was progressively dilated to a 18 French-Pratt dilator. The 0 degree hysteroscope was introduced, with saline fluid used to distend the intrauterine cavity. There were two passages seen. One passage led to the uterine cavity. One appreared to be a false passage, likely created from the dilation of the cervix. The hysteroscope was introduced into the endometrial cavity and the tubal ostea were seen. There were no polyps of fibroids in the endometrial cavity.   The hystersocope was removed and the uterine cavity was curetted gently, yielding scant endometrial curettings. Concern for not entering the endometrial cavity, but instead the false  passage. Hysteroscope replaced. The uterus was intact, no perforation. Direct hysteroscopic sampling of the endometrial cavity performed with the hysteroscopic biopsy. Excellent hemostasis was noted, and all instruments were removed, with excellent hemostasis noted throughout. She was then taken out of dorsal lithotomy.  Minimal discrepancy in fluid was noted.  The patient tolerated the procedure well. Sponge, lap and needle counts were correct x2. The patient was taken to recovery room in excellent condition.  Adrian Prows MD [facility] 07/22/18 1:44 PM

## 2018-07-22 NOTE — Anesthesia Preprocedure Evaluation (Addendum)
Anesthesia Evaluation  Patient identified by MRN, date of birth, ID band Patient awake    Reviewed: Allergy & Precautions, H&P , NPO status , Patient's Chart, lab work & pertinent test results  Airway Mallampati: III  TM Distance: >3 FB Neck ROM: full    Dental  (+) Teeth Intact   Pulmonary neg pulmonary ROS, former smoker,    breath sounds clear to auscultation       Cardiovascular hypertension, negative cardio ROS   Rhythm:regular Rate:Normal     Neuro/Psych  Headaches, negative neurological ROS  negative psych ROS   GI/Hepatic negative GI ROS, Neg liver ROS,   Endo/Other  negative endocrine ROS  Renal/GU      Musculoskeletal  (+) Arthritis ,   Abdominal   Peds  Hematology negative hematology ROS (+)   Anesthesia Other Findings Past Medical History: No date: Arthritis No date: Hyperlipidemia No date: Hypertension  Past Surgical History: No date: CESAREAN SECTION No date: ROTATOR CUFF REPAIR; Right No date: TMJ ARTHROPLASTY No date: TONSILLECTOMY No date: TUBAL LIGATION  BMI    Body Mass Index:  33.07 kg/m      Reproductive/Obstetrics negative OB ROS                            Anesthesia Physical Anesthesia Plan  ASA: II  Anesthesia Plan: General LMA   Post-op Pain Management:    Induction:   PONV Risk Score and Plan: Ondansetron and Dexamethasone  Airway Management Planned:   Additional Equipment:   Intra-op Plan:   Post-operative Plan:   Informed Consent: I have reviewed the patients History and Physical, chart, labs and discussed the procedure including the risks, benefits and alternatives for the proposed anesthesia with the patient or authorized representative who has indicated his/her understanding and acceptance.   Dental Advisory Given  Plan Discussed with: Anesthesiologist, CRNA and Surgeon  Anesthesia Plan Comments:         Anesthesia  Quick Evaluation

## 2018-07-22 NOTE — Transfer of Care (Signed)
Immediate Anesthesia Transfer of Care Note  Patient: Stephanie Shah  Procedure(s) Performed: DILATATION & CURETTAGE/HYSTEROSCOPY (N/A )  Patient Location: PACU  Anesthesia Type:General  Level of Consciousness: drowsy and patient cooperative  Airway & Oxygen Therapy: Patient Spontanous Breathing and Patient connected to face mask oxygen  Post-op Assessment: Report given to RN and Post -op Vital signs reviewed and stable  Post vital signs: Reviewed and stable  Last Vitals:  Vitals Value Taken Time  BP 129/70 07/22/2018  1:34 PM  Temp    Pulse 87 07/22/2018  1:35 PM  Resp 14 07/22/2018  1:35 PM  SpO2 100 % 07/22/2018  1:35 PM  Vitals shown include unvalidated device data.  Last Pain:  Vitals:   07/22/18 1022  TempSrc: Temporal  PainSc: 0-No pain         Complications: No apparent anesthesia complications

## 2018-07-22 NOTE — Anesthesia Procedure Notes (Signed)
Procedure Name: LMA Insertion Performed by: Rollen Selders, CRNA Pre-anesthesia Checklist: Patient identified, Patient being monitored, Timeout performed, Emergency Drugs available and Suction available Patient Re-evaluated:Patient Re-evaluated prior to induction Oxygen Delivery Method: Circle system utilized Preoxygenation: Pre-oxygenation with 100% oxygen Induction Type: IV induction Ventilation: Mask ventilation without difficulty LMA: LMA inserted LMA Size: 3.5 Tube type: Oral Number of attempts: 1 Placement Confirmation: positive ETCO2 and breath sounds checked- equal and bilateral Tube secured with: Tape Dental Injury: Teeth and Oropharynx as per pre-operative assessment        

## 2018-07-22 NOTE — Discharge Instructions (Addendum)
Hysteroscopy, Care After Refer to this sheet in the next few weeks. These instructions provide you with information on caring for yourself after your procedure. Your health care provider may also give you more specific instructions. Your treatment has been planned according to current medical practices, but problems sometimes occur. Call your health care provider if you have any problems or questions after your procedure. What can I expect after the procedure? After your procedure, it is typical to have the following:  You may have some cramping. This normally lasts for a couple days.  You may have bleeding. This can vary from light spotting for a few days to menstrual-like bleeding for 3-7 days.  Follow these instructions at home:  Rest for the first 1-2 days after the procedure.  Only take over-the-counter or prescription medicines as directed by your health care provider. Do not take aspirin. It can increase the chances of bleeding.  Take showers instead of baths for 2 weeks or as directed by your health care provider.  Do not drive for 24 hours or as directed.  Do not drink alcohol while taking pain medicine.  Do not use tampons, douche, or have sexual intercourse for 2 weeks or until your health care provider says it is okay.  Take your temperature twice a day for 4-5 days. Write it down each time.  Follow your health care provider's advice about diet, exercise, and lifting.  If you develop constipation, you may: ? Take a mild laxative if your health care provider approves. ? Add bran foods to your diet. ? Drink enough fluids to keep your urine clear or pale yellow.  Try to have someone with you or available to you for the first 24-48 hours, especially if you were given a general anesthetic.  Follow up with your health care provider as directed. Contact a health care provider if:  You feel dizzy or lightheaded.  You feel sick to your stomach (nauseous).  You have  abnormal vaginal discharge.  You have a rash.  You have pain that is not controlled with medicine. Get help right away if:  You have bleeding that is heavier than a normal menstrual period.  You have a fever.  You have increasing cramps or pain, not controlled with medicine.  You have new belly (abdominal) pain.  You pass out.  You have pain in the tops of your shoulders (shoulder strap areas).  You have shortness of breath. This information is not intended to replace advice given to you by your health care provider. Make sure you discuss any questions you have with your health care provider. Document Released: 08/19/2013 Document Revised: 04/05/2016 Document Reviewed: 05/28/2013 Elsevier Interactive Patient Education  2017 [place]   1) The drugs that you were given will stay in your system until tomorrow so for the next 24 hours you should not:  A) Drive an automobile B) Make any legal decisions C) Drink any alcoholic beverage   2) You may resume regular meals tomorrow.  Today it is better to start with liquids and gradually work up to solid foods.  You may eat anything you prefer, but it is better to start with liquids, then soup and crackers, and gradually work up to solid foods.   3) Please notify your doctor immediately if you have any unusual bleeding, trouble breathing, redness and pain at the surgery site, drainage, fever, or pain not relieved by medication.    4) Additional  Instructions:        Please contact your physician with any problems or Same Day Surgery at [phone], Monday through Friday 6 am to 4 pm, or [place] at [facility] number at [phone].

## 2018-07-22 NOTE — Anesthesia Postprocedure Evaluation (Signed)
Anesthesia Post Note  Patient: Stephanie Shah  Procedure(s) Performed: DILATATION & CURETTAGE/HYSTEROSCOPY (N/A )  Patient location during evaluation: PACU Anesthesia Type: General Level of consciousness: awake and alert Pain management: pain level controlled Vital Signs Assessment: post-procedure vital signs reviewed and stable Respiratory status: spontaneous breathing, nonlabored ventilation, respiratory function stable and patient connected to nasal cannula oxygen Cardiovascular status: blood pressure returned to baseline and stable Postop Assessment: no apparent nausea or vomiting Anesthetic complications: no     Last Vitals:  Vitals:   07/22/18 1410 07/22/18 1422  BP: (!) 157/82 (!) 157/93  Pulse: 71   Resp: 18 18  Temp: (!) 36.1 C   SpO2: 98% 100%    Last Pain:  Vitals:   07/22/18 1422  TempSrc:   PainSc: 0-No pain                 Durenda Hurt

## 2018-07-22 NOTE — Interval H&P Note (Signed)
History and Physical Interval Note:  07/22/2018 10:30 AM  Stephanie Shah  has presented today for surgery, with the diagnosis of THICKENED ENDOMETRIUM,CERVICAL STENOSIS  The various methods of treatment have been discussed with the patient and family. After consideration of risks, benefits and other options for treatment, the patient has consented to  Procedure(s): DILATATION & CURETTAGE/HYSTEROSCOPY (N/A) as a surgical intervention .  The patient's history has been reviewed, patient examined, no change in status, stable for surgery.  I have reviewed the patient's chart and labs.  Questions were answered to the patient's satisfaction.     [place]

## 2018-07-23 ENCOUNTER — Encounter: Payer: Self-pay | Admitting: Obstetrics and Gynecology

## 2018-07-23 LAB — SURGICAL PATHOLOGY

## 2018-07-25 NOTE — Progress Notes (Signed)
Called and discussed with Anetta on the phone. Because of the the nature of this result will plan follow up with [place] to assess the thickness of the cavity in 3 months.

## 2018-07-29 ENCOUNTER — Ambulatory Visit: Payer: BLUE CROSS/BLUE SHIELD | Admitting: Obstetrics and Gynecology

## 2018-09-02 ENCOUNTER — Ambulatory Visit: Payer: Self-pay | Admitting: Family Medicine

## 2018-10-02 ENCOUNTER — Other Ambulatory Visit: Payer: Self-pay | Admitting: Family Medicine

## 2018-11-24 ENCOUNTER — Other Ambulatory Visit: Payer: Self-pay | Admitting: Family Medicine

## 2019-01-05 DIAGNOSIS — M7061 Trochanteric bursitis, right hip: Secondary | ICD-10-CM | POA: Diagnosis not present

## 2019-01-05 DIAGNOSIS — M170 Bilateral primary osteoarthritis of knee: Secondary | ICD-10-CM | POA: Diagnosis not present

## 2019-01-07 ENCOUNTER — Encounter: Payer: Self-pay | Admitting: Physical Therapy

## 2019-01-07 ENCOUNTER — Ambulatory Visit: Payer: 59 | Attending: Orthopedic Surgery | Admitting: Physical Therapy

## 2019-01-07 ENCOUNTER — Other Ambulatory Visit: Payer: Self-pay

## 2019-01-07 DIAGNOSIS — M6281 Muscle weakness (generalized): Secondary | ICD-10-CM | POA: Insufficient documentation

## 2019-01-07 DIAGNOSIS — G8929 Other chronic pain: Secondary | ICD-10-CM | POA: Diagnosis not present

## 2019-01-07 DIAGNOSIS — M25562 Pain in left knee: Secondary | ICD-10-CM | POA: Insufficient documentation

## 2019-01-07 DIAGNOSIS — M25551 Pain in right hip: Secondary | ICD-10-CM | POA: Insufficient documentation

## 2019-01-07 DIAGNOSIS — R262 Difficulty in walking, not elsewhere classified: Secondary | ICD-10-CM | POA: Diagnosis not present

## 2019-01-07 NOTE — Therapy (Deleted)
[facility] [address] [place], [address] Phone: [phone]   Fax:  [phone]  Physical Therapy Evaluation  Patient Details  Name: Stephanie Shah MRN: [id number] Date of Birth: [birth year] Referring Provider (PT): Thornton Park, MD   Encounter Date: 01/07/2019  PT End of Session - 01/08/19 1337    Visit Number  1    Number of Visits  16    Date for PT Re-Evaluation  03/05/19    Authorization Type  [employer] employee reporting from 01/07/2019    Authorization Time Period  Current cert period: 03/04/5360 - 03/05/2019 (latest PN: IE 01/07/2019)    Authorization - Visit Number  1    Authorization - Number of Visits  10    PT Start Time  0945    PT Stop Time  1045    PT Time Calculation (min)  60 min    Activity Tolerance  Patient tolerated treatment well    Behavior During Therapy  [facility] for tasks assessed/performed       Past Medical History:  Diagnosis Date  . Arthritis   . Hyperlipidemia   . Hypertension     Past Surgical History:  Procedure Laterality Date  . CESAREAN SECTION    . DILATATION & CURETTAGE/HYSTEROSCOPY WITH MYOSURE N/A 07/22/2018   Procedure: DILATATION & CURETTAGE/HYSTEROSCOPY;  Surgeon: Homero Fellers, MD;  Location: [facility] ORS;  Service: Gynecology;  Laterality: N/A;  . ROTATOR CUFF REPAIR Right   . TMJ ARTHROPLASTY    . TONSILLECTOMY    . TUBAL LIGATION      There were no vitals filed for this visit.   Subjective Assessment - 01/07/19 1825    Subjective  Patient states condition started gradually over the last few years. She reports some catching and popping in both joints but no locking. She has had radiographs in the hip and knee showing arthritis, but there is a conflict between an MRI years ago on the knee showing grade IV arthritis and a more recent radiograph showing less than that.     Pertinent History  Patient is a 63 y.o. female who presents to outpatient  physical therapy with a referral for medical diagnosis right trochanteric bursitis and bilateral knee apin. This patient's chief complaints consist of right hip and left knee pain, popping and "feeling out of place" at both joints, difficulty with walking, working, and standing up and decreased activity tolerance, leading to the following functional deficits: difficulty with ambulation, working, transfers, usual community activities and fitness activities. Relevant past medical history and comorbidities include dx of MS (originally presented with left arm and facial numbness) that is mostly asymptomatic and rarely has exacerbations (none recently), hypertension, hyperlipidemia, obesity.    Limitations  Walking;Lifting;House hold activities;Standing;Other (comment)   bending, rising, standing, walking, lying on right side, as the day progresses it feels worse, being on the move feels worse, any position too long   Patient Stated Goals  decrease the pain and be able to do more    Currently in Pain?  Yes    Pain Score  7     Pain Location  Hip    Pain Orientation  Right    Pain Descriptors / Indicators  Constant   digging   Pain Type  Chronic pain    Pain Onset  More than a month ago    Pain Frequency  Constant    Aggravating Factors   rising, stanidng, walking, lying on right  side, as the day progresses/pm, on the move    Pain Relieving Factors  sitting, lying except on R side, when still,    Effect of Pain on Daily Activities  restricts work and leisure activities due to difficulty walking and weight bearing    Multiple Pain Sites  Yes    Pain Score  7    Pain Location  Knee    Pain Orientation  Left;Anterior    Pain Descriptors / Indicators  Other (Comment)   "hurting"   Pain Type  Chronic pain    Pain Onset  More than a month ago    Pain Frequency  Constant    Aggravating Factors   bending, rising, stairs, standing, walking, as the day progresses/pm, on the move, any position for too long.     Pain Relieving Factors  sitting, lying, am, when still, with left leg propped up    Effect of Pain on Daily Activities  restricts work and leisure activities due to difficulty walking and weight bearing       MDT Lumbar Physical Therapy Evaluation  SUBJECTIVE EXAM  SUBJECTIVE: HISTORY:  Patient is a 63 y.o. female who presents to outpatient physical therapy with a referral for medical diagnosis right trochanteric bursitis and bilateral knee apin. This patient's chief complaints consist of right hip and left knee pain, popping and "feeling out of place" at both joints, difficulty with walking, working, and standing up and decreased activity tolerance, leading to the following functional deficits: difficulty with ambulation, working, transfers, usual community activities and fitness activities. Relevant past medical history and comorbidities include dx of MS (originally presented with left arm and facial numbness) that is mostly asymptomatic and rarely has exacerbations (none recently), hypertension, hyperlipidemia, obesity. Patient states condition started gradually over the last few years. She reports some catching and popping in both joints but no locking. She has had radiographs in the hip and knee showing arthritis, but there is a conflict between an MRI years ago on the knee showing grade IV arthritis and a more recent radiograph showing less than that.    Work, [employer]ist relations (rounding/walking, sitting) Leisure, mechanical stresses: sitting and propping up foot, was trying to walk with husband for exercise but could not because of pain, likes [place] the mountains Functional disability from present episode:  - PLOF: unlimited in leisure and work activities.  - CLOF: difficulty with walking, working in the yard, getting on knees to bathe dog, difficulty with work tasks, fitness goals.  NPRS Score (0-10): Hip ranges 3-9/10. Knee ranges 3-10/10 Location of symptoms  (from body map):  Right lateral posterior hip, left anterior knee/joint line.   HISTORY Present symptoms: left knee, right hip Present since: left knee last 8 years, R hip last 3 years (both worsening)  Commenced as a result of:  no apparent reason Symptoms at onset: R hip, L knee Constant symptoms: R hip, L knee Intermittent symptoms: none  Worse:  - Bending (knee) - rising (hip/knee) - Standing (hip/knee) - Walking (hip/knee) - Lying (hip when laying on R side) - as the day progresses/pm (hip/knee more activity is worse) - on the move (hip/knee) - Other: any position too long (knee) Better:  - Sitting (hip/knee) - Lying (except right side) - am (hip/knee) - when still (hip/knee) - Other: left leg propped up (knee)  Disturbed sleep: yes, goes back to sleep with repositioning Sleeping postures: supine (wakes when rolls to right side) Previous history: no back pain Previous  treatments (and result): referred to PT once but was unable attend after initial eval due to change in work schedule.    SPECIFIC QUESTIONS:  Gait: has tripped and fallen a couple of times. Medications: NSAIDS/analg General health: good Imaging: yes radiographs on hip and knee showing arthritis Recent or major surgery: no. History of RTC surgery. Recent DNC due to lesion on cervix (benign). Night pain: no Accidents: fell and bumped head over the summer (tripped on flip flop) Unexplained weight loss: no Other: not sensitive to latex, denies long term steroid use, denies hx spinal surgery  OBJECTIVE EXAMINATION  POSTURE Sitting: good Lordosis: red/acc/normal Lateral shift: nil (noted to move slightly away from right in repeated extension) Correction of posture: no effect  NEUROLOGICAL:  Motor deficit: WNL Sensory deficit: BLE WNL and intact to light touch Upper Motor Neuron Screen:  Hoffman's, and Clonus (ankle) negative bilaterally.   SPINE MOTION Lumbar AROM:  *Indicates pain - Flexion: =  WFL - Extension: = WFL except ERP left knee and R hip - Rotation: R = WFL left knee popping, L = WFL left knee popping, left thoracic pain - Side Flexion: R = WFL left knee pain, L = WFL right hip pain.   PERIPHERAL JOINT MOTION (AROM/PROM in degrees):  *Indicates pain Hip = WNL except the following and right hip mild discomfort in most motion - Flexion: R = tissue approximation, mild pain, L = tissue approximation. - Extension: B = mildly limited. Knee - Flexion: R = 146/148, L = 135/137*..  - Extension: R = 0, L = -5.  STRENGTH:  *Indicates pain Hip  - Flexion: R = 4/5*, L = 4+/5. - Extension: R = 4+/5*, L = 4+/5. - Abduction: R =3-/5*, L = 4+/5. Knee - Ext: R = 5/5, L = 4+/5*. - Flex: R = 4+/5, L = 4+/5. Ankle (seated position) - WFL for usual activities  REPEATED MOTIONS TESTING: - REIL 2x10:  during = increasing R hip; after = worse at first, later no worse. - REIS x10 during = increase pain in R hip and L knee after = hip no worse, knee worse.  SPECIAL TESTS: Straight leg raise (SLR): R = positive to sensitizing maneuver left posterior thigh pain (not concordant pain), L = negative FADDIR: R = uncomfortable, L = popping FABER: R = mildly restricted, reproduces pain, L = mild groin pain. Negative knee tests: valgus and varus stress at 0 and 30 degrees, lachman's, anterior and posterior drawer.  Positive knee tests: some discomfort and inconsistent clicking with McMurray's test on L.   ACCESSORY MOTION:  - No excessive laxity noted in L knee.  - Hypomobile to L patellar glides all directions, painful in caudal glides.  - R hip distraction non painful  PALPATION: - TTP at left knee joint line - Highly TTP along right glute med, iliac crest, and greater trochanter.  FUNCTIONAL MOBILITY: - Bed mobility: rolling and sit <> supine I with difficulty due to pain. - Transfers: sit <> stand I with difficulty due to pain.  - Gait: I for household and short community  distances.    Objective measurements completed on examination: See above findings.              PT Education - 01/08/19 1336    Education Details  Exercise purpose/form. Self management techniques. Education on diagnosis, prognosis, POC, anatomy and physiology of current condition Education on HEP including handout     Person(s) Educated  Patient    Methods  Explanation;Demonstration;Tactile cues;Verbal cues    Comprehension  Returned demonstration          PT Long Term Goals - 05/06/18 1701      PT LONG TERM GOAL #1   Title  Patient will have a decrease in R hip pain to 3/10 or less at worst to promote ability to negotiate stairs as well as stand up from prolonged sitting more comfortably.     Baseline  7/10 R hip pain at worst (05/06/2018)    Time  8    Period  Weeks    Status  New    Target Date  07/03/18      PT LONG TERM GOAL #2   Title  Patient improve R glute med and max strength to promote ability to negotiate stairs as well as stand up from prolonged sitting more comfortably.     Time  8    Period  Weeks    Status  New    Target Date  07/03/18      PT LONG TERM GOAL #3   Title  Patient will improve her hip FOTO score by at least 10 points as a demonstration of improved function.     Baseline  Hip FOTO 48 (05/06/2018)    Time  8    Period  Weeks    Status  New    Target Date  07/03/18               Patient will benefit from skilled therapeutic intervention in order to improve the following deficits and impairments:     Visit Diagnosis: No diagnosis found.     Problem List Patient Active Problem List   Diagnosis Date Noted  . Lesion of cervix 03/03/2018  . Cervicogenic headache 09/19/2017  . Allergic rhinitis 08/26/2017  . Arthritis 08/26/2017  . Hyperlipidemia 08/26/2017  . Hypertension 08/26/2017  . MS (multiple sclerosis) ([place]) 08/26/2017    Nancy Nordmann 01/08/2019, 1:50 PM  [facility] [address] [place], [address] Phone: [phone]   Fax:  [phone]  Name: Tanaisha Pittman MRN: [id number] Date of Birth: 1956/03/18

## 2019-01-08 NOTE — Therapy (Signed)
[facility] [address] [place], [address] Phone: [phone]   Fax:  [phone]  Physical Therapy Evaluation  Patient Details  Name: Stephanie Shah MRN: [id number] Date of Birth: 1956/06/19 Referring Provider (PT): Thornton Park, MD   Encounter Date: 01/07/2019  PT End of Session - 01/08/19 1337    Visit Number  1    Number of Visits  16    Date for PT Re-Evaluation  03/05/19    Authorization Type  [employer] employee reporting from 01/07/2019    Authorization Time Period  Current cert period: 07/10/9370 - 03/05/2019 (latest PN: [facility] 01/07/2019)    Authorization - Visit Number  1    Authorization - Number of Visits  10    PT Start Time  0945    PT Stop Time  1045    PT Time Calculation (min)  60 min    Activity Tolerance  Patient tolerated treatment well    Behavior During Therapy  [facility] for tasks assessed/performed       Past Medical History:  Diagnosis Date  . Arthritis   . Hyperlipidemia   . Hypertension     Past Surgical History:  Procedure Laterality Date  . CESAREAN SECTION    . DILATATION & CURETTAGE/HYSTEROSCOPY WITH MYOSURE N/A 07/22/2018   Procedure: DILATATION & CURETTAGE/HYSTEROSCOPY;  Surgeon: Homero Fellers, MD;  Location: [facility] ORS;  Service: Gynecology;  Laterality: N/A;  . ROTATOR CUFF REPAIR Right   . TMJ ARTHROPLASTY    . TONSILLECTOMY    . TUBAL LIGATION      There were no vitals filed for this visit.   Subjective Assessment - 01/07/19 1825    Subjective  Patient states condition started gradually over the last few years. She reports some catching and popping in both joints but no locking. She has had radiographs in the hip and knee showing arthritis, but there is a conflict between an MRI years ago on the knee showing grade IV arthritis and a more recent radiograph showing less than that.     Pertinent History  Patient is a 63 y.o. female who presents to outpatient  physical therapy with a referral for medical diagnosis right trochanteric bursitis and bilateral knee apin. This patient's chief complaints consist of right hip and left knee pain, popping and "feeling out of place" at both joints, difficulty with walking, working, and standing up and decreased activity tolerance, leading to the following functional deficits: difficulty with ambulation, working, transfers, usual community activities and fitness activities. Relevant past medical history and comorbidities include dx of MS (originally presented with left arm and facial numbness) that is mostly asymptomatic and rarely has exacerbations (none recently), hypertension, hyperlipidemia, obesity.    Limitations  Walking;Lifting;House hold activities;Standing;Other (comment)   bending, rising, standing, walking, lying on right side, as the day progresses it feels worse, being on the move feels worse, any position too long   Patient Stated Goals  decrease the pain and be able to do more    Currently in Pain?  Yes    Pain Score  7     Pain Location  Hip    Pain Orientation  Right    Pain Descriptors / Indicators  Constant   digging   Pain Type  Chronic pain    Pain Onset  More than a month ago    Pain Frequency  Constant    Aggravating Factors   rising, stanidng, walking, lying on right  side, as the day progresses/pm, on the move    Pain Relieving Factors  sitting, lying except on R side, when still,    Effect of Pain on Daily Activities  restricts work and leisure activities due to difficulty walking and weight bearing    Multiple Pain Sites  Yes    Pain Score  7    Pain Location  Knee    Pain Orientation  Left;Anterior    Pain Descriptors / Indicators  Other (Comment)   "hurting"   Pain Type  Chronic pain    Pain Onset  More than a month ago    Pain Frequency  Constant    Aggravating Factors   bending, rising, stairs, standing, walking, as the day progresses/pm, on the move, any position for too long.     Pain Relieving Factors  sitting, lying, am, when still, with left leg propped up    Effect of Pain on Daily Activities  restricts work and leisure activities due to difficulty walking and weight bearing         [facility] PT Assessment - 01/08/19 0001      Assessment   Medical Diagnosis  trochanteric bursitis of right hip, right hip and bilateral knee     Referring Provider (PT)  Thornton Park, MD    Hand Dominance  Left    Prior Therapy  initial eval only for right hip in the past. she was unable to continue attending due to work schedule change      Balance Screen   Has the patient fallen in the past 6 months  Yes   pt is not concerned about home set up   How many times?  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 female who presents to outpatient physical therapy with a referral for medical diagnosis right trochanteric bursitis and bilateral knee apin. This patient's chief complaints consist of right hip and left knee pain, popping and "feeling out of place" at both joints, difficulty with walking, working, and standing up and decreased activity tolerance, leading to the following functional deficits: difficulty with ambulation, working, transfers, usual community activities and fitness activities. Relevant past medical history and comorbidities include dx of MS (originally presented with left arm  and facial numbness) that is mostly asymptomatic and rarely has exacerbations (none recently), hypertension, hyperlipidemia, obesity. Patient states condition started gradually over the last few years. She reports some catching and popping in both joints but no locking. She has had radiographs in the hip and knee showing arthritis, but there is a conflict between an MRI years ago on the knee showing grade IV arthritis and a more recent radiograph showing less than that.    Work, [employer] guest relations (rounding/walking, sitting) Leisure, mechanical stresses: sitting and propping up foot, was  trying to walk with husband for exercise but could not because of pain, [place] Functional disability from present episode:  - PLOF: unlimited in leisure and work activities.  - CLOF: difficulty with walking, working in the yard, getting on knees to bathe dog, difficulty with work tasks, fitness goals.  NPRS Score (0-10): Hip ranges 3-9/10. Knee ranges 3-10/10 Location of symptoms (from body map):  Right lateral posterior hip, left anterior knee/joint line.   HISTORY Present symptoms: left knee, right hip Present since: left knee last 8 years, R hip last 3 years (both worsening)  Commenced as a result of:  no apparent reason Symptoms at onset: R hip, L knee Constant symptoms: R hip, L knee Intermittent symptoms: none  Worse:  - Bending (knee) - rising (hip/knee) - Standing (hip/knee) - Walking (hip/knee) - Lying (hip when laying on R side) - as the day progresses/pm (hip/knee more activity is worse) - on the move (hip/knee) - Other: any position too long (knee) Better:  - Sitting (hip/knee) - Lying (except right side) - am (hip/knee) - when still (hip/knee) - Other: left leg propped up (knee)  Disturbed sleep: yes, goes back to sleep with repositioning Sleeping postures: supine (wakes when rolls to right side) Previous history: no back pain Previous  treatments (and result): referred to PT once but was unable attend after initial eval due to change in work schedule.    SPECIFIC QUESTIONS:  Gait: has tripped and fallen a couple of times. Medications: NSAIDS/analg General health: good Imaging: yes radiographs on hip and knee showing arthritis Recent or major surgery: no. History of RTC surgery. Recent DNC due to lesion on cervix (benign). Night pain: no Accidents: fell and bumped head over the summer (tripped on flip flop) Unexplained weight loss: no Other: not sensitive to latex, denies long term steroid use, denies hx spinal surgery  OBJECTIVE EXAMINATION  POSTURE Sitting: good Lordosis: red/acc/normal Lateral shift: nil (noted to move slightly away from right in repeated extension) Correction of posture: no effect  NEUROLOGICAL:  Motor deficit: WNL Sensory deficit: BLE WNL and intact to light touch Upper Motor Neuron Screen:  Hoffman's, and Clonus (ankle) negative bilaterally.  SPINE MOTION Lumbar AROM:  *Indicates pain - Flexion: = WFL - Extension: = WFL except ERP left knee and R hip - Rotation: R = WFL left knee popping, L = WFL left knee popping, left thoracic pain - Side Flexion: R = WFL left knee pain, L = WFL right hip pain.   PERIPHERAL JOINT MOTION (AROM/PROM in degrees):  *Indicates pain Hip = WNL except the following and right hip mild discomfort in most motion - Flexion: R = tissue approximation, mild pain, L = tissue approximation. - Extension: B = mildly limited. Knee - Flexion: R = 146/148, L = 135/137*..  - Extension: R = 0, L = -5.  STRENGTH:  *Indicates pain Hip  - Flexion: R = 4/5*, L = 4+/5. - Extension: R = 4+/5*, L = 4+/5. - Abduction: R =3-/5*, L = 4+/5. Knee - Ext: R = 5/5, L = 4+/5*. - Flex: R = 4+/5, L = 4+/5. Ankle (seated position) - WFL for usual activities  REPEATED MOTIONS TESTING: - REIL 2x10:  during = increasing R hip; after = worse at first, later no worse. - REIS x10  during = increase pain in R hip and L knee after = hip no worse, knee worse.  SPECIAL TESTS: Straight leg raise (SLR): R = positive to sensitizing  maneuver left posterior thigh pain (not concordant pain), L = negative FADDIR: R = uncomfortable, L = popping FABER: R = mildly restricted, reproduces pain, L = mild groin pain. Negative knee tests: valgus and varus stress at 0 and 30 degrees, lachman's, anterior and posterior drawer.  Positive knee tests: some discomfort and inconsistent clicking with McMurray's test on L.   ACCESSORY MOTION:  - No excessive laxity noted in L knee.  - Hypomobile to L patellar glides all directions, painful in caudal glides.  - R hip distraction non painful  PALPATION: - TTP at left knee joint line - Highly TTP along right glute med, iliac crest, and greater trochanter.  FUNCTIONAL MOBILITY: - Bed mobility: rolling and sit <> supine I with difficulty due to pain. - Transfers: sit <> stand I with difficulty due to pain.  Gait: I for household and short community distances.     Objective measurements completed on examination: See above findings.     TREATMENT:  Denies sensitivity to latex Denies long term use of steroid medications Denies history of spinal surgery   Therapeutic exercise: to centralize symptoms and improve ROM, strength, muscular endurance, and activity tolerance required for successful completion of functional activities.  - quad sets x 10 - straight leg raises x 10 - seated isometric hip abduction x 10 - Education on diagnosis, prognosis, POC, anatomy and physiology of current condition.  - Education on HEP including handout   HOME EXERCISE PROGRAM Access Code: [email or web address]  URL: [email or web address]  Date: 01/07/2019  Prepared by: Rosita Kea   Exercises  Supine Quad Set - 20 reps - 5 second hold - 1 Sets - 2x daily  Active Straight Leg Raise with Quad Set - 20 reps - 5 second hold - 1 Sets - 2x daily  Seated  Isometric Hip Abduction with Belt - 20 reps - 5 second hold - 2x daily     PT Education - 01/08/19 1336    Education Details  Exercise purpose/form. Self management techniques. Education on diagnosis, prognosis, POC, anatomy and physiology of current condition Education on HEP including handout     Person(s) Educated  Patient    Methods  Explanation;Demonstration;Tactile cues;Verbal cues    Comprehension  Returned demonstration       PT Short Term Goals - 01/08/19 1408      PT SHORT TERM GOAL #1   Title  Be independent with initial home exercise program for self-management of symptoms.    Baseline  Initial HEP provided at IE (01/07/2019);     Time  2    Period  Weeks    Status  New    Target Date  01/22/19        PT Long Term Goals - 01/08/19 1352      PT LONG TERM GOAL #1   Title  Be independent with a long-term home exercise program for self-management of symptoms.     Baseline  initial HEP provided at IE (01/07/2019);     Time  8    Period  Weeks    Status  New    Target Date  03/05/19      PT LONG TERM GOAL #2   Title  Demonstrate improved FOTO score by 10 units to demonstrate improvement in overall condition and self-reported functional ability.     Baseline  37 (01/07/2019);    Time  8    Period  Weeks    Status  New    Target Date  03/05/19      PT LONG TERM GOAL #3   Title  Patient will demonstrate BLE strength 4+/5 to demonstrate functional strength for improved gait, increased standing tolerance, lifting, carrying, gardening and increased ADL ability.    Baseline  see objective exam (01/07/2019);     Time  8    Period  Weeks    Status  New    Target Date  03/05/19      PT LONG TERM GOAL #4   Title  Be able to squat to chair height with proper form without limitation due to current condition in order to improve ability to lift and complete transfers during usual activities.     Baseline  difficulty with sit <> stand (01/07/2019);     Time  8    Period  Weeks     Status  New    Target Date  03/05/19      PT LONG TERM GOAL #5   Title  Complete community, work and/or recreational activities without limitation due to current condition.     Baseline  difficulty walking, bending, squatting, gardening, caring for child, sleeping, working (01/07/2019):     Time  8    Period  Weeks    Status  New    Target Date  03/05/19             Plan - 01/08/19 1403    Clinical Impression Statement  Patient is a 63 y.o. female referred to outpatient physical therapy with a medical diagnosis of trochanteric bursitis of right hip and bilateral knee pain who presents with signs and symptoms consistent with right hip region and left knee pain. Unable to fully rule out back as symptom generator for either location, but suspect possible referral from lumbar region to right hip over left knee. Right hip is highly TTP over glute med muscle and hip abduction is weak and painful suggesting dysfunction of glute med and lateral hip structures. L knee has joint line tenderness and popping and pain with McMurray's test suggesting meniscal involvement. Also noted for crepitus with patellar motion. However, no locking present which suggests conservative treatment is appropriate. Patient presents with significant pain, ROM, weakness, and gait impairments that are limiting ability to complete usual work, community and household mobility, transfers, bed mobility, sleeping, childcare, gardening, and fitness program without difficulty. Patient will benefit from skilled physical therapy intervention to address current body structure impairments and activity limitations to improve function and work towards goals set in current POC in order to return to prior level of function or maximal functional improvement.    History and Personal Factors relevant to plan of care:  MS (originally presented with left arm and facial numbness) that is mostly asymptomatic and rarely has exacerbations (none  recently), hypertension, hyperlipidemia, obesity.     Clinical Presentation  Evolving    Clinical Presentation due to:  gradually worsening over time    Clinical Decision Making  Moderate    Rehab Potential  Fair    Clinical Impairments Affecting Rehab Potential  (+) motivated; (-) chronicity of sympotms, comorbidities, work limitations.    PT Frequency  2x / week    PT Duration  8 weeks    PT Treatment/Interventions  ADLs/Self Care Home Management;Cryotherapy;Moist Heat;Therapeutic activities;Therapeutic exercise;Neuromuscular re-education;Patient/family education;Manual techniques;Dry needling;Taping;Joint Manipulations;Other (comment);Spinal Manipulations   joint mobilizations grades I-IV   PT Next Visit Plan  assess response to HEP and progress as appropriate    PT [place]  Access Code: [email or web address]     Consulted and Agree with Plan of Care  Patient     ASSESSMENT:  Patient is a 63 y.o. female referred to outpatient physical therapy with a medical diagnosis of trochanteric bursitis of right hip and bilateral knee pain who presents with signs and symptoms consistent with right hip region and left knee pain. Unable to fully rule out back as symptom generator for either location, but suspect possible referral from lumbar region to right hip over left knee. Right hip is highly TTP over glute med muscle and hip abduction is weak and painful suggesting dysfunction of glute med and lateral hip structures. L knee has joint line tenderness and popping and pain with McMurray's test suggesting meniscal involvement. Also noted for crepitus with patellar motion. However, no locking present which suggests conservative treatment is appropriate. Patient presents with significant pain, ROM, weakness, and gait impairments that are limiting ability to complete usual work, community and household mobility, transfers, bed mobility, sleeping, childcare, gardening, and fitness program without  difficulty. Patient will benefit from skilled physical therapy intervention to address current body structure impairments and activity limitations to improve function and work towards goals set in current POC in order to return to prior level of function or maximal functional improvement.    Patient will benefit from skilled therapeutic intervention in order to improve the following deficits and impairments:  Decreased endurance, Decreased mobility, Difficulty walking, Other (comment), Decreased range of motion, Impaired perceived functional ability, Obesity, Pain, Impaired flexibility, Decreased strength, Decreased activity tolerance, Increased muscle spasms(incomplete knowledge of appropriate self-management techniques)  Visit Diagnosis: Pain in right hip  Chronic pain of left knee  Muscle weakness (generalized)  Difficulty in walking, not elsewhere classified     Problem List Patient Active Problem List   Diagnosis Date Noted  . Lesion of cervix 03/03/2018  . Cervicogenic headache 09/19/2017  . Allergic rhinitis 08/26/2017  . Arthritis 08/26/2017  . Hyperlipidemia 08/26/2017  . Hypertension 08/26/2017  . MS (multiple sclerosis) ([place]) 08/26/2017    Nancy Nordmann, PT, DPT 01/08/2019, 2:10 PM  [facility] [address] [place], [address] Phone: [phone]   Fax:  [phone]  Name: Stephanie Shah MRN: [id number] Date of Birth: 05/18/1956

## 2019-01-08 NOTE — Addendum Note (Signed)
Addended by: Rosita Kea R on: 01/08/2019 02:12 PM   Modules accepted: Orders

## 2019-01-12 ENCOUNTER — Encounter: Payer: Self-pay | Admitting: Physical Therapy

## 2019-01-12 ENCOUNTER — Ambulatory Visit: Payer: 59 | Attending: Orthopedic Surgery | Admitting: Physical Therapy

## 2019-01-12 DIAGNOSIS — R262 Difficulty in walking, not elsewhere classified: Secondary | ICD-10-CM | POA: Insufficient documentation

## 2019-01-12 DIAGNOSIS — M25562 Pain in left knee: Secondary | ICD-10-CM | POA: Diagnosis not present

## 2019-01-12 DIAGNOSIS — M6281 Muscle weakness (generalized): Secondary | ICD-10-CM | POA: Diagnosis not present

## 2019-01-12 DIAGNOSIS — G8929 Other chronic pain: Secondary | ICD-10-CM | POA: Insufficient documentation

## 2019-01-12 DIAGNOSIS — M25551 Pain in right hip: Secondary | ICD-10-CM | POA: Insufficient documentation

## 2019-01-12 NOTE — Therapy (Signed)
[facility] [address], [place], [address] Phone: [phone]   Fax:  [phone]  Physical Therapy Treatment  Patient Details  Name: Stephanie Shah MRN: [id number] Date of Birth: [birth year] Referring Provider (PT): Thornton Park, MD   Encounter Date: 01/12/2019  PT End of Session - 01/12/19 1704    Visit Number  2    Number of Visits  16    Date for PT Re-Evaluation  03/05/19    Authorization Type  [employer] employee reporting from 01/07/2019    Authorization Time Period  Current cert period: 1/32/4401 - 03/05/2019 (latest PN: [facility] 01/07/2019)    Authorization - Visit Number  2    Authorization - Number of Visits  10    PT Start Time  1603    PT Stop Time  1645    PT Time Calculation (min)  42 min    Activity Tolerance  Patient tolerated treatment well;Patient limited by pain    Behavior During Therapy  [facility] for tasks assessed/performed       Past Medical History:  Diagnosis Date  . Arthritis   . Hyperlipidemia   . Hypertension     Past Surgical History:  Procedure Laterality Date  . CESAREAN SECTION    . DILATATION & CURETTAGE/HYSTEROSCOPY WITH MYOSURE N/A 07/22/2018   Procedure: DILATATION & CURETTAGE/HYSTEROSCOPY;  Surgeon: Homero Fellers, MD;  Location: [facility] ORS;  Service: Gynecology;  Laterality: N/A;  . ROTATOR CUFF REPAIR Right   . TMJ ARTHROPLASTY    . TONSILLECTOMY    . TUBAL LIGATION      There were no vitals filed for this visit.  Subjective Assessment - 01/12/19 1608    Subjective  Patient reports she is feeling well but has noticed no significant changes in her pain or funcition since last treatment session.  She reports 5/10 in left knee and right hip.  She also has noticed some pain in the left hip but not as pronounced as in the right. It also is aggrevated after extended walking.  She reports no excessive soreness or pain following last treatment session.  Pt reports her HEP  is going well, she is using a green or Shah theraband for hip abduciton without increased pain.    Pertinent History  Patient is a 63 y.o. female who presents to outpatient physical therapy with a referral for medical diagnosis right trochanteric bursitis and bilateral knee apin. This patient's chief complaints consist of right hip and left knee pain, popping and "feeling out of place" at both joints, difficulty with walking, working, and standing up and decreased activity tolerance, leading to the following functional deficits: difficulty with ambulation, working, transfers, usual community activities and fitness activities. Relevant past medical history and comorbidities include dx of MS (originally presented with left arm and facial numbness) that is mostly asymptomatic and rarely has exacerbations (none recently), hypertension, hyperlipidemia, obesity.    Limitations  Walking;Lifting;House hold activities;Standing;Other (comment)   bending, rising, standing, walking, lying on right side, as the day progresses it feels worse, being on the move feels worse, any position too long   Patient Stated Goals  decrease the pain and be able to do more    Currently in Pain?  Yes    Pain Score  5     Pain Location  Hip    Pain Orientation  Right    Pain Descriptors / Indicators  Constant    Pain Type  Chronic pain  Pain Onset  More than a month ago    Pain Score  5    Pain Location  Knee    Pain Orientation  Left;Anterior    Pain Type  Chronic pain    Pain Onset  More than a month ago       TREATMENT:  Denies sensitivity to latex Denies long term use of steroid medications Denies history of spinal surgery   Therapeutic exercise:to centralize symptoms and improve ROM, strength, muscular endurance, and activity tolerance required for successful completion of functional activities.  - quad sets x 20 - straight leg raises x 02 - seated hip abduction against Shah theraband x 20 - supine repeated  knee L knee extension with clinician OP x 10, increase, worse.  - supine repeated L knee flexion with pt OP x 10, increasing, worse.  - seated repeated L knee extension with pt OP 2x10, increasing, worse.  - standing repeated R hip extension, NE, NE after first 10. Increased, worse after last 10 reps. Right knee on table, left foot on ground, left hand on back of chair for support.  - standing repeated R hip extension in IR position (laying on table with press up and left leg offloaded due to irritation of right leg in standing).  - ambulation 50 feet and step up to 6 inch step between exercises to assess response.  - bridges x 10 to strengthen hamstrings and glutes.  - Education on diagnosis, prognosis, POC, anatomy and physiology of current condition.  - Education on HEP including handout   Manual therapy: to reduce pain and tissue tension, improve range of motion, neuromodulation, in order to promote improved ability to complete functional activities. - long axis distraction through right hip grade II-IV to decrease pain and improve motion, tender in groin with stronger grade.   HOME EXERCISE PROGRAM Access Code: [email or web address]  URL: [email or web address]  Date: 01/12/2019  Prepared by: Rosita Kea   Exercises  Active Straight Leg Raise with Quad Set - 20 reps - 5 second hold - 1 Sets - 2x daily  Supine Bridge - 10-15 reps - 1 second hold - 3 Sets - 2x daily  Seated Isometric Hip Abduction with Belt - 20 reps - 5 second hold - 2x daily  Seated Hip Adduction Squeeze with Ball - 20 reps - 5 second hold - 2x daily    PT Education - 01/12/19 1704    Education Details  Exercise purpose/form. Self management techniques. Education on diagnosis, prognosis, POC, anatomy and physiology of current condition Education on HEP including handout     Person(s) Educated  Patient    Methods  Explanation;Demonstration;Tactile cues;Verbal cues;Handout    Comprehension  Verbalized understanding        PT Short Term Goals - 01/08/19 1408      PT SHORT TERM GOAL #1   Title  Be independent with initial home exercise program for self-management of symptoms.    Baseline  Initial HEP provided at IE (01/07/2019);     Time  2    Period  Weeks    Status  New    Target Date  01/22/19        PT Long Term Goals - 01/08/19 1352      PT LONG TERM GOAL #1   Title  Be independent with a long-term home exercise program for self-management of symptoms.     Baseline  initial HEP provided at IE (01/07/2019);     Time  8  Period  Weeks    Status  New    Target Date  03/05/19      PT LONG TERM GOAL #2   Title  Demonstrate improved FOTO score by 10 units to demonstrate improvement in overall condition and self-reported functional ability.     Baseline  37 (01/07/2019);    Time  8    Period  Weeks    Status  New    Target Date  03/05/19      PT LONG TERM GOAL #3   Title  Patient will demonstrate BLE strength 4+/5 to demonstrate functional strength for improved gait, increased standing tolerance, lifting, carrying, gardening and increased ADL ability.    Baseline  see objective exam (01/07/2019);     Time  8    Period  Weeks    Status  New    Target Date  03/05/19      PT LONG TERM GOAL #4   Title  Be able to squat to chair height with proper form without limitation due to current condition in order to improve ability to lift and complete transfers during usual activities.     Baseline  difficulty with sit <> stand (01/07/2019);     Time  8    Period  Weeks    Status  New    Target Date  03/05/19      PT LONG TERM GOAL #5   Title  Complete community, work and/or recreational activities without limitation due to current condition.     Baseline  difficulty walking, bending, squatting, gardening, caring for child, sleeping, working (01/07/2019):     Time  8    Period  Weeks    Status  New    Target Date  03/05/19            Plan - 01/12/19 1708    Clinical Impression  Statement  Pt tolerated treatment well. She had a overall increase in pain by end of session. She demonstrated eventual increase in pain, worse with knee flexion and extension repeated motions and hip extension and extension with IR repeated motions, ruling these out as a viable directional preference for those joints at this time. Updated HEP to include progressed strengthening for the glutes, hamstrings and adductors. Plan to continue with graded strengthening exercises for hip and knee as tolerated and re-visit directional preference at a later time if appropriate. Pt appears to be very compliant with HEP and participates fully in treatment sessions. Pt required multimodal cuing for proper technique and to facilitate improved neuromuscular control, strength, range of motion, and functional ability resulting in improved performance and form. Patient will benefit from skilled physical therapy intervention to address current body structure impairments and activity limitations to improve function and work towards goals set in current POC in order to return to prior level of function or maximal functional improvement.    Rehab Potential  Fair    Clinical Impairments Affecting Rehab Potential  (+) motivated; (-) chronicity of sympotms, comorbidities, work limitations.    PT Frequency  2x / week    PT Duration  8 weeks    PT Treatment/Interventions  ADLs/Self Care Home Management;Cryotherapy;Moist Heat;Therapeutic activities;Therapeutic exercise;Neuromuscular re-education;Patient/family education;Manual techniques;Dry needling;Taping;Joint Manipulations;Other (comment);Spinal Manipulations   joint mobilizations grades I-IV   PT Next Visit Plan  assess response to HEP and progress as appropriate    PT Home Exercise Plan  Medbridge Access Code: [email or web address]     Consulted and Agree with Plan of Care  Patient       Patient will benefit from skilled therapeutic intervention in order to improve the following deficits  and impairments:  Decreased endurance, Decreased mobility, Difficulty walking, Other (comment), Decreased range of motion, Impaired perceived functional ability, Obesity, Pain, Impaired flexibility, Decreased strength, Decreased activity tolerance, Increased muscle spasms(incomplete knowledge of appropriate self-management techniques)  Visit Diagnosis: Pain in right hip  Chronic pain of left knee  Muscle weakness (generalized)  Difficulty in walking, not elsewhere classified     Problem List Patient Active Problem List   Diagnosis Date Noted  . Lesion of cervix 03/03/2018  . Cervicogenic headache 09/19/2017  . Allergic rhinitis 08/26/2017  . Arthritis 08/26/2017  . Hyperlipidemia 08/26/2017  . Hypertension 08/26/2017  . MS (multiple sclerosis) ([place]) 08/26/2017    Nancy Nordmann, PT, DPT 01/12/2019, 5:11 PM  [facility] [address], [place], [address] Phone: [phone]   Fax:  [phone]  Name: Stephanie Shah MRN: [id number] Date of Birth: February 17, 1956

## 2019-01-15 ENCOUNTER — Ambulatory Visit: Payer: 59 | Admitting: Physical Therapy

## 2019-01-19 ENCOUNTER — Ambulatory Visit: Payer: 59 | Admitting: Physical Therapy

## 2019-01-21 ENCOUNTER — Encounter: Payer: Self-pay | Admitting: Family Medicine

## 2019-01-21 ENCOUNTER — Other Ambulatory Visit: Payer: Self-pay

## 2019-01-21 ENCOUNTER — Ambulatory Visit: Payer: 59 | Admitting: Family Medicine

## 2019-01-21 VITALS — BP 142/88 | HR 94 | Temp 99.0°F | Wt 180.0 lb

## 2019-01-21 DIAGNOSIS — R05 Cough: Secondary | ICD-10-CM | POA: Diagnosis not present

## 2019-01-21 DIAGNOSIS — R059 Cough, unspecified: Secondary | ICD-10-CM

## 2019-01-21 DIAGNOSIS — J069 Acute upper respiratory infection, unspecified: Secondary | ICD-10-CM

## 2019-01-21 MED ORDER — HYDROCODONE-HOMATROPINE 5-1.5 MG/5ML PO SYRP: 5.0000 mL | ORAL_SOLUTION | Freq: Three times a day (TID) | ORAL | 0 refills | Status: DC | PRN

## 2019-01-21 NOTE — Patient Instructions (Signed)
.   Please review the attached list of medications and notify my office if there are any errors.   . Please bring all of your medications to every appointment so we can make sure that our medication list is the same as yours.   

## 2019-01-21 NOTE — Progress Notes (Signed)
Patient: Stephanie Shah Female    DOB: 03-30-1956   63 y.o.   MRN: [id number] Visit Date: 01/21/2019  Today's Provider: Lelon Huh, MD   Chief Complaint  Patient presents with  . Sinusitis   Subjective:     Sinusitis  This is a new problem. Episode onset: Started 01/18/2019. The problem has been gradually worsening since onset. Associated symptoms include congestion, coughing, sinus pressure and a sore throat. Pertinent negatives include no chills, diaphoresis, ear pain, headaches, shortness of breath, sneezing or swollen glands. Past treatments include oral decongestants and acetaminophen. The treatment provided mild relief.  States he husband had similar symptoms a few days before she started feeling ill. Fevers have been low grade, no higher than 105. Is achy.   Denies any recent travel to areas with endemic coronavirus or travels on cruise ship or known contact with anyone diagnosed with coronavirus.    Allergies  Allergen Reactions  . Penicillins Rash    Has patient had a PCN reaction causing immediate rash, facial/tongue/throat swelling, SOB or lightheadedness with hypotension: Yes Has patient had a PCN reaction causing severe rash involving mucus membranes or skin necrosis: No Has patient had a PCN reaction that required hospitalization: No Has patient had a PCN reaction occurring within the last 10 years: No If all of the above answers are "NO", then may proceed with Cephalosporin use.      Current Outpatient Medications:  .  acetaminophen (TYLENOL) 500 MG tablet, Take 2 tablets (1,000 mg total) by mouth every 6 (six) hours as needed for mild pain., Disp: 100 tablet, Rfl: 0 .  aspirin EC 81 MG tablet, Take 81 mg by mouth daily., Disp: , Rfl:  .  atorvastatin (LIPITOR) 20 MG tablet, TAKE 1 TABLET BY MOUTH ONCE DAILY, Disp: 90 tablet, Rfl: 3 .  BIOTIN PO, Take 500 mcg by mouth daily. , Disp: , Rfl:  .  butalbital-acetaminophen-caffeine (FIORICET, ESGIC) 50-325-40 MG  tablet, Take 1 tablet by mouth every 6 (six) hours as needed for headache. , Disp: , Rfl:  .  FLUoxetine (PROZAC) 40 MG capsule, TAKE 1 CAPSULE BY MOUTH EVERY DAY, Disp: 90 capsule, Rfl: 4 .  hydrochlorothiazide (MICROZIDE) 12.5 MG capsule, Take 1 capsule (12.5 mg total) by mouth daily., Disp: 90 capsule, Rfl: 3 .  ibuprofen (ADVIL,MOTRIN) 800 MG tablet, Take 1 tablet (800 mg total) by mouth every 8 (eight) hours as needed., Disp: 30 tablet, Rfl: 0 .  ketoconazole (NIZORAL) 2 % shampoo, Apply 1 application topically once a week. , Disp: , Rfl:  .  lisinopril (PRINIVIL,ZESTRIL) 40 MG tablet, Take 1 tablet (40 mg total) by mouth daily., Disp: 90 tablet, Rfl: 3 .  metroNIDAZOLE (METROCREAM) 0.75 % cream, Apply 1 application topically daily. , Disp: , Rfl:  .  Multiple Vitamin (MULTIVITAMIN) tablet, Take 1 tablet by mouth daily., Disp: , Rfl:  .  naproxen (NAPROSYN) 500 MG tablet, TAKE 1 TABLET BY MOUTH TWICE DAILY WITH A MEAL, Disp: 30 tablet, Rfl: 11  Review of Systems  Constitutional: Positive for fatigue. Negative for activity change, appetite change, chills, diaphoresis, fever and unexpected weight change.  HENT: Positive for congestion, sinus pressure, sinus pain and sore throat. Negative for ear discharge, ear pain, postnasal drip, rhinorrhea, sneezing, tinnitus and trouble swallowing.   Eyes: Positive for discharge and itching. Negative for photophobia, pain, redness and visual disturbance.  Respiratory: Positive for cough and chest tightness. Negative for apnea, choking, shortness of breath, wheezing and stridor.  Gastrointestinal: Negative.   Neurological: Negative for dizziness, light-headedness and headaches.    Social History   Tobacco Use  . Smoking status: Former Smoker    Types: Cigarettes    Last attempt to quit: 11/13/2003    Years since quitting: 15.2  . Smokeless tobacco: Never Used  Substance Use Topics  . Alcohol use: Yes    Alcohol/week: 7.0 - 14.0 standard drinks     Types: 7 - 14 Glasses of wine per week    Comment: 1-2 glasses of wine per day      Objective:   BP (!) 142/88 (BP Location: Left Arm, Patient Position: Sitting, Cuff Size: Large)   Pulse 94   Temp 99 F (37.2 C) (Oral)   Wt 180 lb (81.6 kg)   SpO2 96%   BMI 34.01 kg/m  Vitals:   01/21/19 1447  BP: (!) 142/88  Pulse: 94  Temp: 99 F (37.2 C)  TempSrc: Oral  SpO2: 96%  Weight: 180 lb (81.6 kg)     Physical Exam  General Appearance:    Alert, cooperative, no distress  HENT:   bilateral TM normal without fluid or infection, neck without nodes, frontal sinus tender and nasal mucosa pale and congested  Eyes:    PERRL, conjunctiva/corneas clear, EOM's intact       Lungs:     Clear to auscultation bilaterally, respirations unlabored  Heart:    Regular rate and rhythm  Neurologic:   Awake, alert, oriented x 3. No apparent focal neurological           defect.          Assessment & Plan    1. Upper respiratory tract infection, unspecified type Counseled regarding signs and symptoms of viral and bacterial respiratory infections. Advised to call or return for additional evaluation if she develops any sign of bacterial infection, or if current symptoms last longer than 10 days.    2. Cough - HYDROcodone-homatropine (HYCODAN) 5-1.5 MG/5ML syrup; Take 5 mLs by mouth every 8 (eight) hours as needed for cough.  Dispense: 120 mL; Refill: 0  Advised to stay home from work at [employer] for the rest of the week and until her symptoms have resolved.     Lelon Huh, MD  [facility]

## 2019-01-23 ENCOUNTER — Telehealth: Payer: Self-pay

## 2019-01-23 MED ORDER — AZITHROMYCIN 250 MG PO TABS: ORAL_TABLET | ORAL | 0 refills | Status: AC

## 2019-01-23 NOTE — Telephone Encounter (Signed)
Pt advised.   Thanks,   -Laura  

## 2019-01-23 NOTE — Telephone Encounter (Signed)
Seen on Wed told to call back is sx changed.   Had dry cough, now a little better.  She states it is now wet and feels she has stuff to cough out but it has not been productive.  No fever.  [facility] [phone]  Allergic to PCN

## 2019-01-23 NOTE — Telephone Encounter (Signed)
Have sent prescription for zpack to [facility] pharmacy

## 2019-01-27 ENCOUNTER — Ambulatory Visit: Payer: 59 | Admitting: Physical Therapy

## 2019-01-29 ENCOUNTER — Other Ambulatory Visit: Payer: Self-pay | Admitting: Family Medicine

## 2019-01-29 DIAGNOSIS — R05 Cough: Secondary | ICD-10-CM

## 2019-01-29 DIAGNOSIS — R059 Cough, unspecified: Secondary | ICD-10-CM

## 2019-01-29 MED ORDER — HYDROCODONE-HOMATROPINE 5-1.5 MG/5ML PO SYRP: 5.0000 mL | ORAL_SOLUTION | Freq: Three times a day (TID) | ORAL | 0 refills | Status: DC | PRN

## 2019-01-29 NOTE — Telephone Encounter (Signed)
pt would like a refill on   The Hycodan 5-1.5 mg syrup  [place]  Thanks teri

## 2019-01-30 ENCOUNTER — Encounter: Payer: Self-pay | Admitting: Family Medicine

## 2019-01-30 ENCOUNTER — Other Ambulatory Visit: Payer: Self-pay

## 2019-01-30 ENCOUNTER — Ambulatory Visit: Payer: 59 | Admitting: Family Medicine

## 2019-01-30 VITALS — BP 146/96 | HR 80 | Temp 97.9°F | Ht 61.0 in | Wt 180.2 lb

## 2019-01-30 DIAGNOSIS — R059 Cough, unspecified: Secondary | ICD-10-CM

## 2019-01-30 DIAGNOSIS — R05 Cough: Secondary | ICD-10-CM

## 2019-01-30 DIAGNOSIS — J40 Bronchitis, not specified as acute or chronic: Secondary | ICD-10-CM | POA: Diagnosis not present

## 2019-01-30 MED ORDER — AZITHROMYCIN 250 MG PO TABS: ORAL_TABLET | ORAL | 0 refills | Status: AC

## 2019-01-30 NOTE — Progress Notes (Signed)
Patient: Stephanie Shah Female    DOB: 1956-02-25   63 y.o.   MRN: [id number] Visit Date: 01/30/2019  Today's Provider: Lelon Huh, MD   Chief Complaint  Patient presents with  . Follow-up    URI but pt is still having a cough, congestion and wheezing   Subjective:     HPI   Follow up for URI  The patient was last seen for this 10 days ago. She was prescribed Hycodan, called back a few days later and cough had become wet, so was sent in prescription for azithromycin which she has completed.   She reports good compliance with treatment. She feels that condition is Improved. She is not having side effects.   Pt reports she is still having a cough some non-productive with congestion in chest and head.  Pt reports she has felt like she has some wheezing.   ------------------------------------------------------------------------------------   Allergies  Allergen Reactions  . Penicillins Rash    Has patient had a PCN reaction causing immediate rash, facial/tongue/throat swelling, SOB or lightheadedness with hypotension: Yes Has patient had a PCN reaction causing severe rash involving mucus membranes or skin necrosis: No Has patient had a PCN reaction that required hospitalization: No Has patient had a PCN reaction occurring within the last 10 years: No If all of the above answers are "NO", then may proceed with Cephalosporin use.      Current Outpatient Medications:  .  acetaminophen (TYLENOL) 500 MG tablet, Take 2 tablets (1,000 mg total) by mouth every 6 (six) hours as needed for mild pain., Disp: 100 tablet, Rfl: 0 .  aspirin EC 81 MG tablet, Take 81 mg by mouth daily., Disp: , Rfl:  .  atorvastatin (LIPITOR) 20 MG tablet, TAKE 1 TABLET BY MOUTH ONCE DAILY, Disp: 90 tablet, Rfl: 3 .  BIOTIN PO, Take 500 mcg by mouth daily. , Disp: , Rfl:  .  butalbital-acetaminophen-caffeine (FIORICET, ESGIC) 50-325-40 MG tablet, Take 1 tablet by mouth every 6 (six) hours as needed  for headache. , Disp: , Rfl:  .  FLUoxetine (PROZAC) 40 MG capsule, TAKE 1 CAPSULE BY MOUTH EVERY DAY, Disp: 90 capsule, Rfl: 4 .  hydrochlorothiazide (MICROZIDE) 12.5 MG capsule, Take 1 capsule (12.5 mg total) by mouth daily., Disp: 90 capsule, Rfl: 3 .  HYDROcodone-homatropine (HYCODAN) 5-1.5 MG/5ML syrup, Take 5 mLs by mouth every 8 (eight) hours as needed for cough., Disp: 120 mL, Rfl: 0 .  ketoconazole (NIZORAL) 2 % shampoo, Apply 1 application topically once a week. , Disp: , Rfl:  .  lisinopril (PRINIVIL,ZESTRIL) 40 MG tablet, Take 1 tablet (40 mg total) by mouth daily., Disp: 90 tablet, Rfl: 3 .  metroNIDAZOLE (METROCREAM) 0.75 % cream, Apply 1 application topically daily. , Disp: , Rfl:  .  Multiple Vitamin (MULTIVITAMIN) tablet, Take 1 tablet by mouth daily., Disp: , Rfl:  .  naproxen (NAPROSYN) 500 MG tablet, TAKE 1 TABLET BY MOUTH TWICE DAILY WITH A MEAL, Disp: 30 tablet, Rfl: 11 .  ibuprofen (ADVIL,MOTRIN) 800 MG tablet, Take 1 tablet (800 mg total) by mouth every 8 (eight) hours as needed. (Patient not taking: Reported on 01/30/2019), Disp: 30 tablet, Rfl: 0  Review of Systems  Constitutional: Negative.   HENT: Positive for congestion, sinus pressure and sinus pain.   Eyes: Negative.   Respiratory: Positive for cough and wheezing.   Cardiovascular: Negative.   Gastrointestinal: Negative.   Endocrine: Negative.   Genitourinary: Negative.   Musculoskeletal: Negative.  Skin: Negative.   Allergic/Immunologic: Negative.   Neurological: Negative.   Hematological: Negative.   Psychiatric/Behavioral: Negative.     Social History   Tobacco Use  . Smoking status: Former Smoker    Types: Cigarettes    Last attempt to quit: 11/13/2003    Years since quitting: 15.2  . Smokeless tobacco: Never Used  Substance Use Topics  . Alcohol use: Yes    Alcohol/week: 7.0 - 14.0 standard drinks    Types: 7 - 14 Glasses of wine per week    Comment: 1-2 glasses of wine per day       Objective:   BP (!) 146/96 (BP Location: Left Arm, Patient Position: Sitting, Cuff Size: Normal)   Pulse 80   Temp 97.9 F (36.6 C) (Oral)   Ht 5\' 1"  (1.549 m)   Wt 180 lb 3.2 oz (81.7 kg)   SpO2 98%   BMI 34.05 kg/m  Vitals:   01/30/19 0950  BP: (!) 146/96  Pulse: 80  Temp: 97.9 F (36.6 C)  TempSrc: Oral  SpO2: 98%  Weight: 180 lb 3.2 oz (81.7 kg)  Height: 5\' 1"  (1.549 m)     Physical Exam  General Appearance:    Alert, cooperative, no distress  HENT:   neck without nodes, throat normal without erythema or exudate, sinuses tender and post nasal drip noted  Eyes:    PERRL, conjunctiva/corneas clear, EOM's intact       Lungs:     Clear to auscultation bilaterally, respirations unlabored  Heart:    Regular rate and rhythm  Neurologic:   Awake, alert, oriented x 3. No apparent focal neurological           defect.           Assessment & Plan     1. Bronchitis Improved but no resolved. refill- azithromycin (ZITHROMAX) 250 MG tablet; 2 by mouth today, then 1 daily for 4 days  Dispense: 6 tablet; Refill: 0  2. Cough  - azithromycin (ZITHROMAX) 250 MG tablet; 2 by mouth today, then 1 daily for 4 days  Dispense: 6 tablet; Refill: 0     Lelon Huh, MD  [facility]

## 2019-01-30 NOTE — Patient Instructions (Signed)
.   Please review the attached list of medications and notify my office if there are any errors.   You can take OTC Mucinex (guaifenesin) for chest or sinus congestion and to help your cough. You can take this in combination with any of the other cough and cold medications you have been taking

## 2019-01-30 NOTE — Addendum Note (Signed)
Addended by: Birdie Sons on: 01/30/2019 01:19 PM   Modules accepted: Level of Service

## 2019-02-02 ENCOUNTER — Ambulatory Visit: Payer: 59 | Admitting: Physical Therapy

## 2019-02-02 ENCOUNTER — Encounter: Payer: Self-pay | Admitting: Physical Therapy

## 2019-02-02 ENCOUNTER — Other Ambulatory Visit: Payer: Self-pay | Admitting: Obstetrics and Gynecology

## 2019-02-02 ENCOUNTER — Other Ambulatory Visit: Payer: Self-pay | Admitting: Family Medicine

## 2019-02-02 DIAGNOSIS — M6281 Muscle weakness (generalized): Secondary | ICD-10-CM

## 2019-02-02 DIAGNOSIS — M25551 Pain in right hip: Secondary | ICD-10-CM

## 2019-02-02 DIAGNOSIS — G8929 Other chronic pain: Secondary | ICD-10-CM

## 2019-02-02 DIAGNOSIS — M25562 Pain in left knee: Secondary | ICD-10-CM

## 2019-02-02 DIAGNOSIS — R262 Difficulty in walking, not elsewhere classified: Secondary | ICD-10-CM

## 2019-02-02 DIAGNOSIS — Z1231 Encounter for screening mammogram for malignant neoplasm of breast: Secondary | ICD-10-CM

## 2019-02-02 NOTE — Therapy (Signed)
[facility] [address] [place], [address] Phone: [phone]   Fax:  [phone]  Patient Details  Name: Stephanie Shah MRN: [id number] Date of Birth: [birth year] Referring Provider:  No ref. provider found  Encounter Date: 02/02/2019  Reason for telecommunication instead of in person visit: Clinic is closed due to precautions to prevent COVID-19 spread during the pandemic.  Called pt to update about clinic closing for in-person visits due to COVID-19 pandemic. Spoke to pt letting pt know their appointments are canceled at this time, we are monitoring the phones/voicemail, are looking into telehealth options, and would like to know if they is interested in that. Also let pt know that I am available by phone to discuss any concerns, answer questions, provide advice, modify exercise program, and problem solve remotely; that I want to and be there with them as much as possible without in-person visits. Advised pt to call if they have any PT needs or questions. Let pt know we will call back when the clinic re-opens.  Patient states she has been sick with some sort of respiratory virus, but it is getting better. Working with PCP on it but it is not identified at this time. States she resigned from her job and her last day was Wednesday. She has already retired and her current job was extra. She says her knees and hips are feeling better since she has not been up walking at work so much. Reports her HEP is going well but she is not doing it as much as she thinks she should. [place] she will call us or get in contact if she has any needs.   Nancy Nordmann, PT, DPT 02/02/2019, 11:38 AM  [facility] [address] [place], [address] Phone: [phone]   Fax:  [phone]

## 2019-02-04 ENCOUNTER — Ambulatory Visit: Payer: 59 | Admitting: Physical Therapy

## 2019-02-09 ENCOUNTER — Ambulatory Visit: Payer: 59 | Admitting: Physical Therapy

## 2019-02-12 ENCOUNTER — Encounter: Payer: 59 | Admitting: Physical Therapy

## 2019-02-16 ENCOUNTER — Encounter: Payer: 59 | Admitting: Physical Therapy

## 2019-02-18 ENCOUNTER — Encounter: Payer: 59 | Admitting: Physical Therapy

## 2019-02-23 ENCOUNTER — Encounter: Payer: 59 | Admitting: Physical Therapy

## 2019-02-24 NOTE — Therapy (Signed)
[place] [facility] [address] [place], [place], [address] Phone: [phone]   Fax:  [phone]  Patient Details  Name: Stephanie Shah MRN: [id number] Date of Birth: April 16, 1956 Referring Provider:  No ref. provider found  Encounter Date: 02/24/2019  The [facility] outpatient clinics are closed at this time due to the COVID-19 epidemic. The patient was contacted in regards to their therapy services. The patient is in agreement that they are safe and consent to being on hold for therapy services until the [facility] outpatient facilities reopen. At which time, the patient will be contacted to schedule an appointment to resume therapy services.    Blythe Stanford, PT DPT 02/24/2019, 2:14 PM  [place] [facility] [address] [place], [place], [address] Phone: [phone]   Fax:  [phone]

## 2019-02-25 ENCOUNTER — Encounter: Payer: 59 | Admitting: Physical Therapy

## 2019-02-27 ENCOUNTER — Other Ambulatory Visit: Payer: Self-pay

## 2019-03-01 MED ORDER — BUTALBITAL-APAP-CAFFEINE 50-325-40 MG PO TABS: 1.0000 | ORAL_TABLET | Freq: Four times a day (QID) | ORAL | 1 refills | Status: DC | PRN

## 2019-03-02 ENCOUNTER — Encounter: Payer: 59 | Admitting: Physical Therapy

## 2019-03-04 ENCOUNTER — Encounter: Payer: 59 | Admitting: Physical Therapy

## 2019-03-04 DIAGNOSIS — Z1211 Encounter for screening for malignant neoplasm of colon: Secondary | ICD-10-CM | POA: Diagnosis not present

## 2019-03-09 ENCOUNTER — Encounter: Payer: 59 | Admitting: Physical Therapy

## 2019-03-11 ENCOUNTER — Encounter: Payer: 59 | Admitting: Physical Therapy

## 2019-03-13 MED FILL — ATORVASTATIN 20 MG TABLET: 20 | 90 days supply | Qty: 90 | Fill #0

## 2019-03-16 ENCOUNTER — Encounter: Payer: 59 | Admitting: Physical Therapy

## 2019-03-18 ENCOUNTER — Encounter: Payer: 59 | Admitting: Physical Therapy

## 2019-05-14 ENCOUNTER — Ambulatory Visit
Admission: RE | Admit: 2019-05-14 | Discharge: 2019-05-14 | Disposition: A | Discharge status: Home / Self Care | Payer: 59 | Source: Ambulatory Visit | Attending: Family Medicine | Admitting: Family Medicine

## 2019-05-14 ENCOUNTER — Other Ambulatory Visit: Payer: Self-pay

## 2019-05-14 DIAGNOSIS — Z1231 Encounter for screening mammogram for malignant neoplasm of breast: Secondary | ICD-10-CM | POA: Diagnosis not present

## 2019-05-14 IMAGING — MG DIGITAL SCREENING BILATERAL MAMMOGRAM WITH TOMO AND CAD
8 series · 8 of 24 positions shown · non-contrast
Comparison: Previous exam(s).

CLINICAL DATA: Screening.

EXAM:
DIGITAL SCREENING BILATERAL MAMMOGRAM WITH TOMO AND CAD

[R MLO synth-2D]
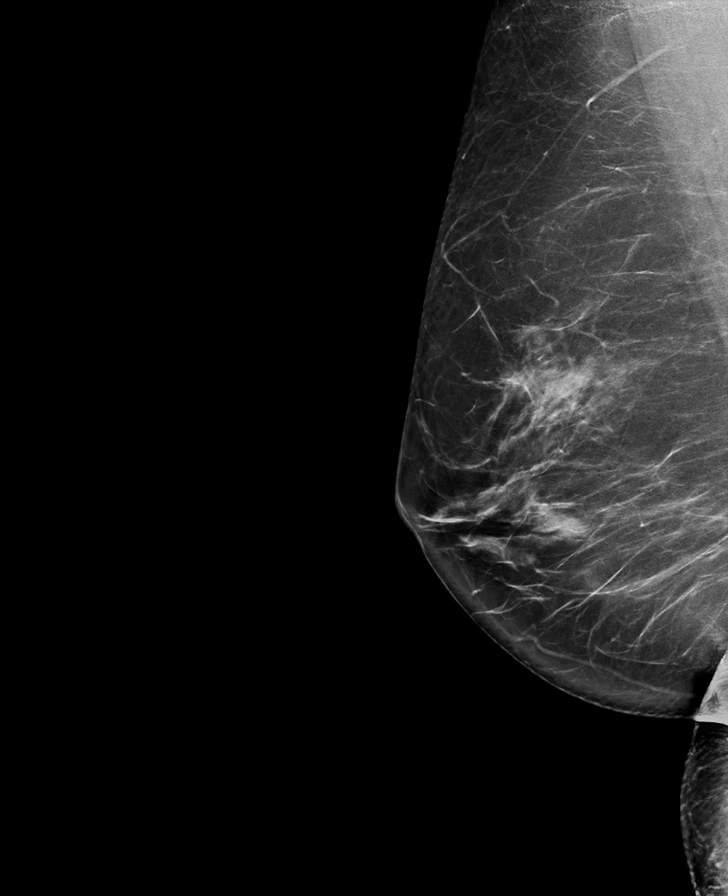

[L CC synth-2D]
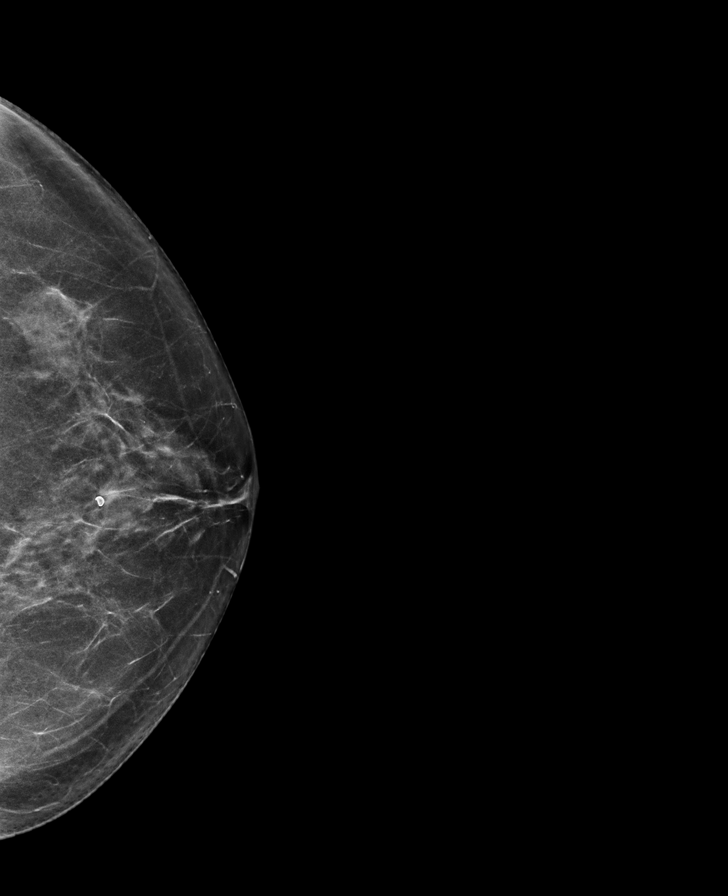

[L MLO synth-2D]
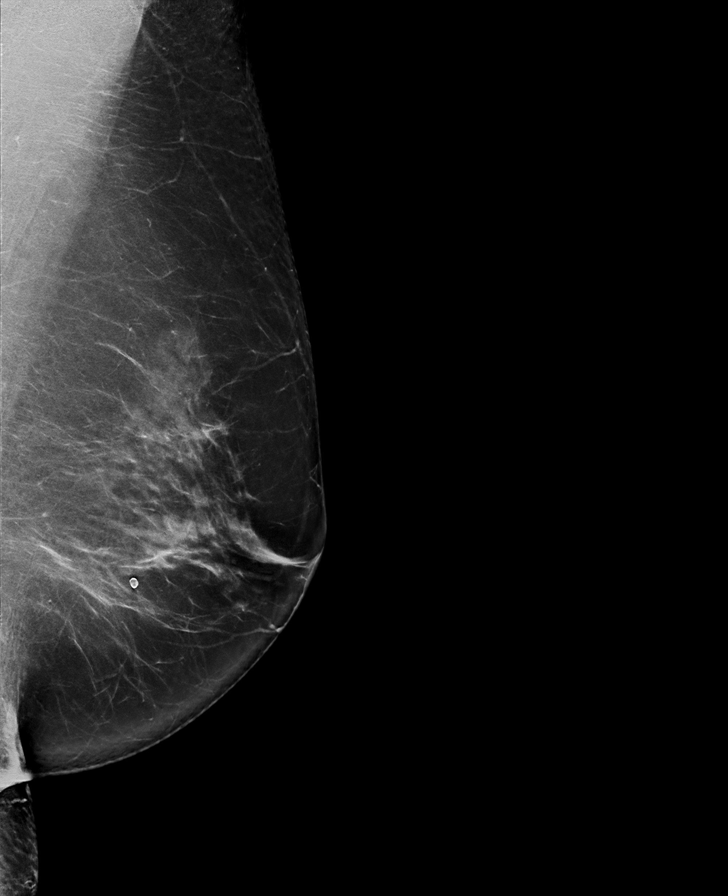

[R CC synth-2D]
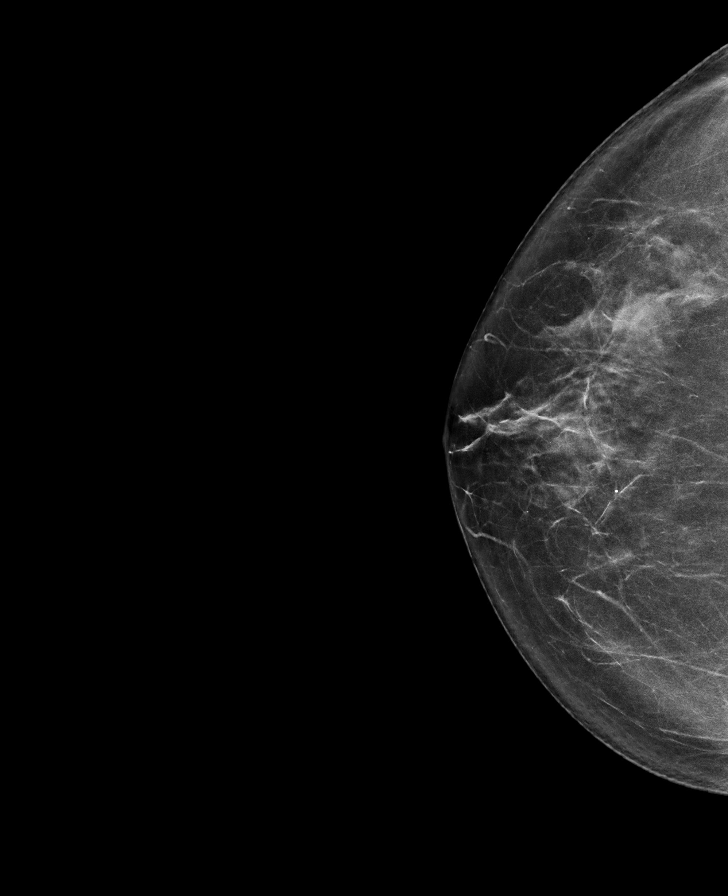

[R MLO tomo · tomo slice 41/80.0]
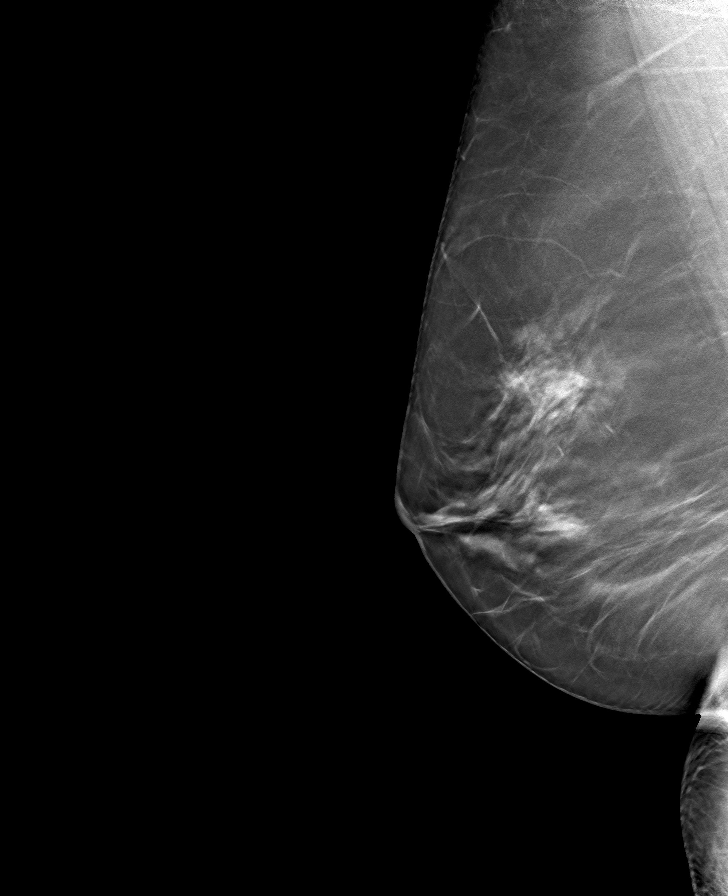

[L MLO tomo · tomo slice 44/87.0]
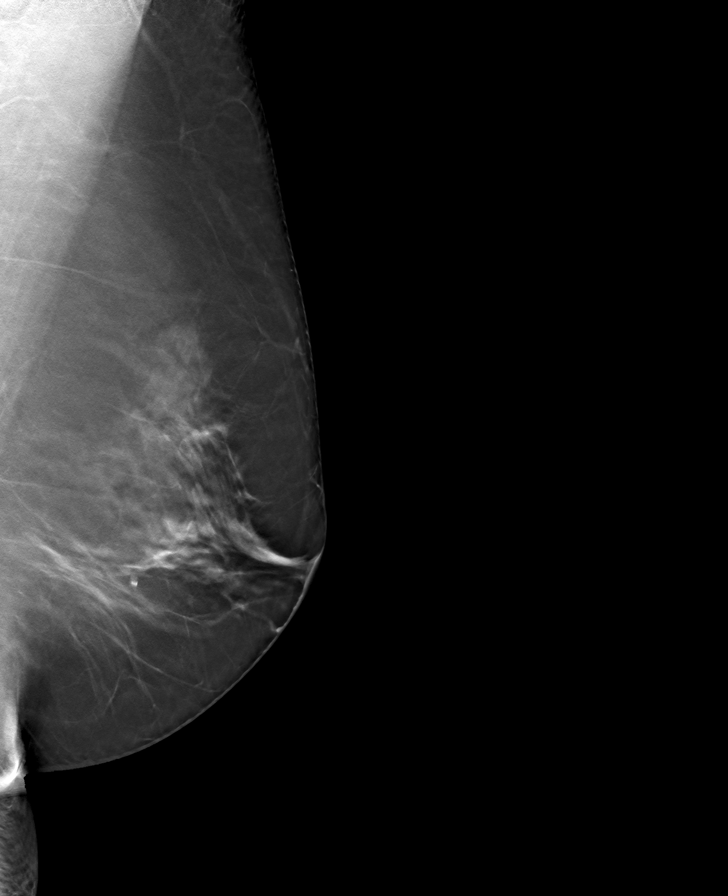

[R CC tomo · tomo slice 37/73.0]
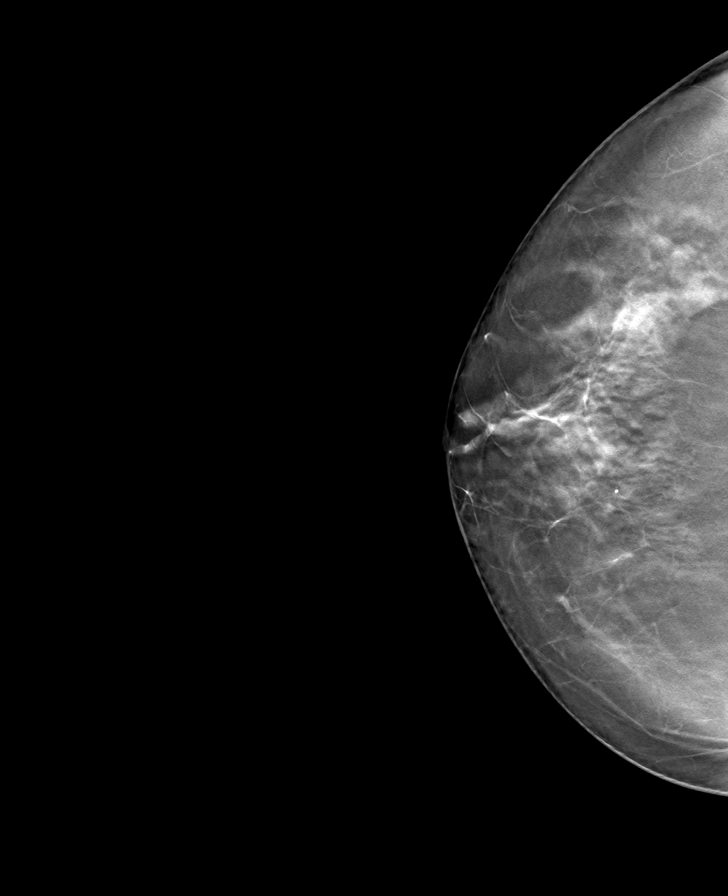

[L CC tomo · tomo slice 35/70.0]
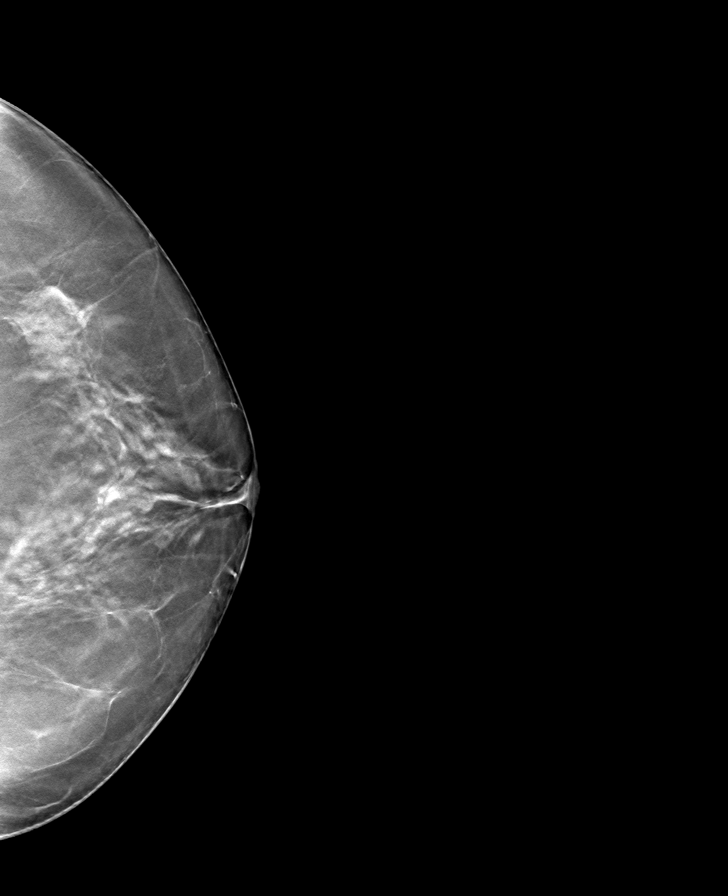

[8 of 24 positions shown; findings below may reference images not displayed]

ACR Breast Density Category b: There are scattered areas of
fibroglandular density.
FINDINGS: There are no findings suspicious for malignancy. Images were
processed with CAD.
IMPRESSION: No mammographic evidence of malignancy. A result letter of this
screening mammogram will be mailed directly to the patient.

RECOMMENDATION:
Screening mammogram in one year. (Code:[TQ])

BI-RADS CATEGORY  1: Negative.

## 2019-06-01 ENCOUNTER — Other Ambulatory Visit: Payer: Self-pay | Admitting: Family Medicine

## 2019-06-30 ENCOUNTER — Other Ambulatory Visit: Payer: Self-pay | Admitting: Family Medicine

## 2019-08-06 DIAGNOSIS — L720 Epidermal cyst: Secondary | ICD-10-CM | POA: Diagnosis not present

## 2019-08-06 DIAGNOSIS — L219 Seborrheic dermatitis, unspecified: Secondary | ICD-10-CM | POA: Diagnosis not present

## 2019-08-06 DIAGNOSIS — L659 Nonscarring hair loss, unspecified: Secondary | ICD-10-CM | POA: Diagnosis not present

## 2019-08-14 ENCOUNTER — Other Ambulatory Visit: Payer: Self-pay | Admitting: Family Medicine

## 2019-09-28 DIAGNOSIS — M1712 Unilateral primary osteoarthritis, left knee: Secondary | ICD-10-CM | POA: Diagnosis not present

## 2019-10-06 DIAGNOSIS — M1712 Unilateral primary osteoarthritis, left knee: Secondary | ICD-10-CM | POA: Diagnosis not present

## 2019-10-15 DIAGNOSIS — M1712 Unilateral primary osteoarthritis, left knee: Secondary | ICD-10-CM | POA: Diagnosis not present

## 2019-10-23 DIAGNOSIS — M1712 Unilateral primary osteoarthritis, left knee: Secondary | ICD-10-CM | POA: Diagnosis not present

## 2019-11-30 ENCOUNTER — Encounter: Payer: Self-pay | Admitting: Physical Therapy

## 2019-11-30 DIAGNOSIS — M6281 Muscle weakness (generalized): Secondary | ICD-10-CM

## 2019-11-30 DIAGNOSIS — G8929 Other chronic pain: Secondary | ICD-10-CM

## 2019-11-30 DIAGNOSIS — M25551 Pain in right hip: Secondary | ICD-10-CM

## 2019-11-30 DIAGNOSIS — R262 Difficulty in walking, not elsewhere classified: Secondary | ICD-10-CM

## 2019-11-30 NOTE — Therapy (Signed)
[facility] [address], [place], [address] Phone: [phone]   Fax:  [phone]  Physical Therapy No-Visit Discharge Summary Reporting period: 01/07/2019 - 02/24/2019  Patient Details  Name: Stephanie Shah MRN: [id number] Date of Birth: Apr 22, 1956 Referring Provider (PT): [place], MD   Encounter Date: 11/30/2019    Past Medical History:  Diagnosis Date  . Arthritis   . Hyperlipidemia   . Hypertension     Past Surgical History:  Procedure Laterality Date  . CESAREAN SECTION    . DILATATION & CURETTAGE/HYSTEROSCOPY WITH MYOSURE N/A 07/22/2018   Procedure: DILATATION & CURETTAGE/HYSTEROSCOPY;  Surgeon: Homero Fellers, MD;  Location: [facility] ORS;  Service: Gynecology;  Laterality: N/A;  . ROTATOR CUFF REPAIR Right   . TMJ ARTHROPLASTY    . TONSILLECTOMY    . TUBAL LIGATION      There were no vitals filed for this visit.  Subjective Assessment - 11/30/19 0939    Subjective  Patient's care was paused during temporary clinic closure due to COVID 19 precautions and did not return when the clinic re-opened.    Pertinent History  Patient is a 64 y.o. female who presents to outpatient physical therapy with a referral for medical diagnosis right trochanteric bursitis and bilateral knee apin. This patient's chief complaints consist of right hip and left knee pain, popping and "feeling out of place" at both joints, difficulty with walking, working, and standing up and decreased activity tolerance, leading to the following functional deficits: difficulty with ambulation, working, transfers, usual community activities and fitness activities. Relevant past medical history and comorbidities include dx of MS (originally presented with left arm and facial numbness) that is mostly asymptomatic and rarely has exacerbations (none recently), hypertension, hyperlipidemia, obesity.    Limitations  Walking;Lifting;House  hold activities;Standing;Other (comment)   bending, rising, standing, walking, lying on right side, as the day progresses it feels worse, being on the move feels worse, any position too long   Patient Stated Goals  decrease the pain and be able to do more    Pain Onset  More than a month ago    Pain Onset  More than a month ago       OBJECTIVE Patient is not present for examination at this time. Please see previous documentation for latest objective data.     PT Short Term Goals - 11/30/19 0941      PT SHORT TERM GOAL #1   Title  Be independent with initial home exercise program for self-management of symptoms.    Baseline  Initial HEP provided at IE (01/07/2019);     Time  2    Period  Weeks    Status  Unable to assess    Target Date  01/22/19        PT Long Term Goals - 11/30/19 0941      PT LONG TERM GOAL #1   Title  Be independent with a long-term home exercise program for self-management of symptoms.     Baseline  initial HEP provided at IE (01/07/2019);     Time  8    Period  Weeks    Status  Unable to assess    Target Date  03/05/19      PT LONG TERM GOAL #2   Title  Demonstrate improved FOTO score by 10 units to demonstrate improvement in overall condition and self-reported functional ability.     Baseline  37 (01/07/2019);  Time  8    Period  Weeks    Status  Unable to assess    Target Date  03/05/19      PT LONG TERM GOAL #3   Title  Patient will demonstrate BLE strength 4+/5 to demonstrate functional strength for improved gait, increased standing tolerance, lifting, carrying, gardening and increased ADL ability.    Baseline  see objective exam (01/07/2019);     Time  8    Period  Weeks    Status  Not Met    Target Date  03/05/19      PT LONG TERM GOAL #4   Title  Be able to squat to chair height with proper form without limitation due to current condition in order to improve ability to lift and complete transfers during usual activities.     Baseline   difficulty with sit <> stand (01/07/2019);     Time  8    Period  Weeks    Status  Not Met    Target Date  03/05/19      PT LONG TERM GOAL #5   Title  Complete community, work and/or recreational activities without limitation due to current condition.     Baseline  difficulty walking, bending, squatting, gardening, caring for child, sleeping, working (01/07/2019):     Time  8    Period  Weeks    Status  Not Met    Target Date  03/05/19        Plan - 11/30/19 0944    Clinical Impression Statement  Patient attended 2 physical therapy session before pausing care for clinic shutdown out of an abundance of caution due to rising COVID 19 cases in the community. Patient did not return to PT following clinic re-opening and is therefore discharged from physical therapy.    Rehab Potential  Fair    Clinical Impairments Affecting Rehab Potential  (+) motivated; (-) chronicity of sympotms, comorbidities, work limitations.    PT Frequency  2x / week    PT Duration  8 weeks    PT Treatment/Interventions  ADLs/Self Care Home Management;Cryotherapy;Moist Heat;Therapeutic activities;Therapeutic exercise;Neuromuscular re-education;Patient/family education;Manual techniques;Dry needling;Taping;Joint Manipulations;Other (comment);Spinal Manipulations   joint mobilizations grades I-IV   PT Next Visit Plan  Patient is discharged from physical therapy    PT Home Exercise Plan  Medbridge Access Code: [email or web address]     Consulted and Agree with Plan of Care  Patient       Patient will benefit from skilled therapeutic intervention in order to improve the following deficits and impairments:  Decreased endurance, Decreased mobility, Difficulty walking, Other (comment), Decreased range of motion, Impaired perceived functional ability, Obesity, Pain, Impaired flexibility, Decreased strength, Decreased activity tolerance, Increased muscle spasms(incomplete knowledge of appropriate self-management techniques)  Visit  Diagnosis: Pain in right hip  Chronic pain of left knee  Muscle weakness (generalized)  Difficulty in walking, not elsewhere classified     Problem List Patient Active Problem List   Diagnosis Date Noted  . Lesion of cervix 03/03/2018  . Cervicogenic headache 09/19/2017  . Allergic rhinitis 08/26/2017  . Arthritis 08/26/2017  . Hyperlipidemia 08/26/2017  . Hypertension 08/26/2017  . MS (multiple sclerosis) ([facility]) 08/26/2017    Everlean Alstrom. Graylon Good, PT, DPT 11/30/19, 9:44 AM  [facility] [address] [place], [address] Phone: [phone]   Fax:  [phone]  Name: Stephanie Shah MRN: [id number] Date of Birth: 07-10-1956

## 2019-12-30 DIAGNOSIS — L821 Other seborrheic keratosis: Secondary | ICD-10-CM | POA: Diagnosis not present

## 2019-12-30 DIAGNOSIS — D229 Melanocytic nevi, unspecified: Secondary | ICD-10-CM | POA: Diagnosis not present

## 2019-12-30 DIAGNOSIS — L578 Other skin changes due to chronic exposure to nonionizing radiation: Secondary | ICD-10-CM | POA: Diagnosis not present

## 2019-12-30 DIAGNOSIS — Z1283 Encounter for screening for malignant neoplasm of skin: Secondary | ICD-10-CM | POA: Diagnosis not present

## 2019-12-30 DIAGNOSIS — L814 Other melanin hyperpigmentation: Secondary | ICD-10-CM | POA: Diagnosis not present

## 2019-12-30 DIAGNOSIS — L219 Seborrheic dermatitis, unspecified: Secondary | ICD-10-CM | POA: Diagnosis not present

## 2020-02-12 ENCOUNTER — Other Ambulatory Visit: Payer: Self-pay | Admitting: Family Medicine

## 2020-02-12 NOTE — Telephone Encounter (Signed)
Call to patient- scheduled for 04/06/20- refilled per protocol

## 2020-04-05 NOTE — Progress Notes (Signed)
I,Stephanie Shah,acting as a scribe for Lelon Huh, MD.,have documented all relevant documentation on the behalf of Lelon Huh, MD,as directed by  Lelon Huh, MD while in the presence of Lelon Huh, MD.   Complete physical exam   Patient: Stephanie Shah   DOB: May 30, 1956   64 y.o. Female  MRN: [id number] Visit Date: 04/06/2020  Today's healthcare provider: Lelon Huh, MD   Chief Complaint  Patient presents with  . Annual Exam  . Hypertension  . Hyperlipidemia   Subjective    Stephanie Shah is a 64 y.o. female who presents today for a complete physical exam.  She reports consuming a regular diet. Home exercise routine includes housework- painting and home repairs. She generally feels fairly well. She reports sleeping fairly well. She does not have additional problems to discuss today.  HPI  Last Pap: 03/03/2018- Normal/ HPV Negative Last Mammogram:  05/14/2019- BI-RADS Cat I Negative  Hypertension, follow-up  BP Readings from Last 3 Encounters:  04/06/20 112/80  01/30/19 (!) 146/96  01/21/19 (!) 142/88   Wt Readings from Last 3 Encounters:  04/06/20 187 lb (84.8 kg)  01/30/19 180 lb 3.2 oz (81.7 kg)  01/21/19 180 lb (81.6 kg)     She was last seen for hypertension 06/17/2018.  BP at that visit was 126/85. Management since that visit includes continue same medications.  She reports good compliance with treatment. She is not having side effects.  She is following a Low Sodium diet. She is exercising. She does not smoke.  Use of agents associated with hypertension: NSAIDS.   Outside blood pressures are not checked. Symptoms: No chest pain No chest pressure  No palpitations No syncope  No dyspnea No orthopnea  No paroxysmal nocturnal dyspnea No lower extremity edema   Pertinent labs: Lab Results  Component Value Date   CHOL 156 02/24/2018   HDL 63 02/24/2018   LDLCALC 80 02/24/2018   TRIG 67 02/24/2018   CHOLHDL 2.5 02/24/2018   Lab Results    Component Value Date   NA 138 07/22/2018   K 4.0 07/22/2018   CREATININE 0.57 02/24/2018   GFRNONAA 100 02/24/2018   GFRAA 116 02/24/2018   GLUCOSE 96 07/22/2018     The 10-year ASCVD risk score Mikey Bussing DC Jr., et al., 2013) is: 3.7%   --------------------------------------------------------------------------------------------------- Lipid/Cholesterol, Follow-up  Last lipid panel Other pertinent labs  Lab Results  Component Value Date   CHOL 156 02/24/2018   HDL 63 02/24/2018   LDLCALC 80 02/24/2018   TRIG 67 02/24/2018   CHOLHDL 2.5 02/24/2018   Lab Results  Component Value Date   ALT 31 02/24/2018   AST 28 02/24/2018   PLT 336 07/22/2018   TSH 1.230 02/24/2018     She was last seen for this more than 1 year ago  Management since that visit includes continue same medication.  She reports good compliance with treatment. She is not having side effects.   Symptoms: No chest pain No chest pressure/discomfort  No dyspnea No lower extremity edema  No numbness or tingling of extremity No orthopnea  No palpitations No paroxysmal nocturnal dyspnea  No speech difficulty No syncope   Current diet: well balanced Current exercise: housecleaning Working on remodeling home keeping her very active.   The 10-year ASCVD risk score Mikey Bussing DC Jr., et al., 2013) is: 3.7%  --------------------------------------------------------------------------------------------------- She also complains that her ears or itchy all the time. She does have history seborrheic dermatitis of face and uses steroid  creams prn, which are effective.   Past Medical History:  Diagnosis Date  . Arthritis   . Hyperlipidemia   . Hypertension    Past Surgical History:  Procedure Laterality Date  . CESAREAN SECTION    . DILATATION & CURETTAGE/HYSTEROSCOPY WITH MYOSURE N/A 07/22/2018   Procedure: DILATATION & CURETTAGE/HYSTEROSCOPY;  Surgeon: Homero Fellers, MD;  Location: [facility] ORS;  Service:  Gynecology;  Laterality: N/A;  . ROTATOR CUFF REPAIR Right   . TMJ ARTHROPLASTY    . TONSILLECTOMY    . TUBAL LIGATION     Social History   Socioeconomic History  . Marital status: Married    Spouse name: Louie Casa  . Number of children: 1  . Years of education: Not on file  . Highest education level: Master's degree (e.g., MA, MS, MEng, MEd, MSW, MBA)  Occupational History    Comment: Part Time at [employer]  Tobacco Use  . Smoking status: Former Smoker    Types: Cigarettes    Quit date: 11/13/2003    Years since quitting: 16.4  . Smokeless tobacco: Never Used  Substance and Sexual Activity  . Alcohol use: Yes    Alcohol/week: 7.0 - 14.0 standard drinks    Types: 7 - 14 Glasses of wine per week    Comment: 1-2 glasses of wine per day  . Drug use: No  . Sexual activity: Yes    Birth control/protection: Post-menopausal  Other Topics Concern  . Not on file  Social History Narrative   Moved from [place] back to [place] Sept, 2018. Daughter in-law of Donnetta Groenke   Social Determinants of Health   Financial Resource Strain:   . Difficulty of Paying Living Expenses:   Food Insecurity:   . Worried About Charity fundraiser in the Last Year:   . Arboriculturist in the Last Year:   Transportation Needs:   . Film/video editor (Medical):   Marland Kitchen Lack of Transportation (Non-Medical):   Physical Activity:   . Days of Exercise per Week:   . Minutes of Exercise per Session:   Stress:   . Feeling of Stress :   Social Connections:   . Frequency of Communication with Friends and Family:   . Frequency of Social Gatherings with Friends and Family:   . Attends Religious Services:   . Active Member of Clubs or Organizations:   . Attends Archivist Meetings:   Marland Kitchen Marital Status:   Intimate Partner Violence:   . Fear of Current or Ex-Partner:   . Emotionally Abused:   Marland Kitchen Physically Abused:   . Sexually Abused:    Family Status  Relation Name Status  . Mother   Deceased  . Father  Deceased at age 15  . MGF  (Not Specified)  . PGM  (Not Specified)  . Sister  Alive  . Neg Hx  (Not Specified)   Family History  Problem Relation Age of Onset  . Cancer Mother        pancreatic  . Hypertension Mother   . Alzheimer's disease Mother   . Heart attack Father        diet age 38  . Heart disease Maternal Grandfather   . Cancer Paternal Grandmother   . Dementia Paternal Grandmother   . Hypertension Sister   . Colon cancer Neg Hx   . Breast cancer Neg Hx   . Ovarian cancer Neg Hx   . Cervical cancer Neg Hx    Allergies  Allergen Reactions  . Penicillins Rash    Has patient had a PCN reaction causing immediate rash, facial/tongue/throat swelling, SOB or lightheadedness with hypotension: Yes Has patient had a PCN reaction causing severe rash involving mucus membranes or skin necrosis: No Has patient had a PCN reaction that required hospitalization: No Has patient had a PCN reaction occurring within the last 10 years: No If all of the above answers are "NO", then may proceed with Cephalosporin use.     Patient Care Team: Birdie Sons, MD as PCP - General (Family Medicine)   Medications: Outpatient Medications Prior to Visit  Medication Sig  . aspirin EC 81 MG tablet Take 81 mg by mouth daily.  Marland Kitchen atorvastatin (LIPITOR) 20 MG tablet TAKE 1 TABLET BY MOUTH ONCE DAILY  . butalbital-acetaminophen-caffeine (FIORICET) 50-325-40 MG tablet TAKE 1 TABLET BY MOUTH EVERY 6 HOURS AS NEEDED FOR HEADACHE.  Marland Kitchen FLUoxetine (PROZAC) 40 MG capsule TAKE 1 CAPSULE BY MOUTH ONCE DAILY  . hydrochlorothiazide (MICROZIDE) 12.5 MG capsule TAKE 1 CAPSULE BY MOUTH ONCE DAILY  . ketoconazole (NIZORAL) 2 % shampoo Apply 1 application topically once a week.   Marland Kitchen lisinopril (ZESTRIL) 40 MG tablet TAKE 1 TABLET BY MOUTH ONCE DAILY  . metroNIDAZOLE (METROCREAM) 0.75 % cream Apply 1 application topically daily.   . Multiple Vitamin (MULTIVITAMIN) tablet Take 1 tablet by mouth  daily.  . [DISCONTINUED] BIOTIN PO Take 500 mcg by mouth daily.   . [DISCONTINUED] HYDROcodone-homatropine (HYCODAN) 5-1.5 MG/5ML syrup Take 5 mLs by mouth every 8 (eight) hours as needed for cough. (Patient not taking: Reported on 04/06/2020)  . [DISCONTINUED] ibuprofen (ADVIL,MOTRIN) 800 MG tablet Take 1 tablet (800 mg total) by mouth every 8 (eight) hours as needed. (Patient not taking: Reported on 01/30/2019)  . [DISCONTINUED] naproxen (NAPROSYN) 500 MG tablet TAKE 1 TABLET BY MOUTH TWICE DAILY WITH A MEAL (Patient not taking: Reported on 04/06/2020)   No facility-administered medications prior to visit.    Review of Systems  Constitutional: Negative for chills, fatigue and fever.  HENT: Negative for congestion, ear pain, rhinorrhea, sneezing and sore throat.   Eyes: Negative.  Negative for pain and redness.  Respiratory: Negative for cough, shortness of breath and wheezing.   Cardiovascular: Negative for chest pain and leg swelling.  Gastrointestinal: Negative for abdominal pain, blood in stool, constipation, diarrhea and nausea.  Endocrine: Negative for polydipsia and polyphagia.  Genitourinary: Negative.  Negative for dysuria, flank pain, hematuria, pelvic pain, vaginal bleeding and vaginal discharge.  Musculoskeletal: Negative for arthralgias, back pain, gait problem and joint swelling.  Skin: Negative for rash.  Neurological: Negative.  Negative for dizziness, tremors, seizures, weakness, light-headedness, numbness and headaches.  Hematological: Negative for adenopathy.  Psychiatric/Behavioral: Negative.  Negative for behavioral problems, confusion and dysphoric mood. The patient is not nervous/anxious and is not hyperactive.      Objective    BP 112/80 (BP Location: Left Arm, Patient Position: Sitting, Cuff Size: Normal)   Pulse 85   Temp (!) 97.3 F (36.3 C) (Temporal)   Resp 16   Wt 187 lb (84.8 kg)   SpO2 97% Comment: room air  BMI 35.33 kg/m   Physical Exam   General  Appearance:    Obese female. Alert, cooperative, in no acute distress, appears stated age   Head:    Normocephalic, without obvious abnormality, atraumatic  Eyes:    PERRL, conjunctiva/corneas clear, EOM's intact, fundi    benign, both eyes  Ears:    Normal TM's dry  flaky canals of both ears  Nose:   Nares normal, septum midline, mucosa normal, no drainage    or sinus tenderness  Throat:   Lips, mucosa, and tongue normal; teeth and gums normal  Neck:   Supple, symmetrical, trachea midline, no adenopathy;    thyroid:  no enlargement/tenderness/nodules; no carotid   bruit or JVD  Back:     Symmetric, no curvature, ROM normal, no CVA tenderness  Lungs:     Clear to auscultation bilaterally, respirations unlabored  Chest Wall:    No tenderness or deformity   Heart:    Normal heart rate. Normal rhythm. No murmurs, rubs, or gallops.   Breast Exam:    normal appearance, no masses or tenderness  Abdomen:     Soft, non-tender, bowel sounds active all four quadrants,    no masses, no organomegaly  Pelvic:    deferred  Extremities:   All extremities are intact. No cyanosis or edema  Pulses:   2+ and symmetric all extremities  Skin:   Skin color, texture, turgor normal, no rashes or lesions  Lymph nodes:   Cervical, supraclavicular, and axillary nodes normal  Neurologic:   CNII-XII intact, normal strength, sensation and reflexes    throughout    Depression Screen  PHQ 2/9 Scores 04/06/2020 01/30/2019 03/03/2018  PHQ - 2 Score 0 0 0    No results found for any visits on 04/06/20.  Assessment & Plan    Routine Health Maintenance and Physical Exam  Exercise Activities and Dietary recommendations Goals   None     Immunization History  Administered Date(s) Administered  . Influenza,inj,Quad PF,6+ Mos 08/26/2017  . Influenza-Unspecified 07/24/2018, 08/28/2019  . PFIZER SARS-COV-2 Vaccination 01/28/2020, 02/18/2020  . Zoster Recombinat (Shingrix) 07/15/2019, 10/27/2019    Health  Maintenance  Topic Date Due  . TETANUS/TDAP  Never done  . MAMMOGRAM  05/13/2020  . INFLUENZA VACCINE  06/12/2020  . PAP SMEAR-Modifier  03/03/2021  . COLONOSCOPY  02/01/2026  . COVID-19 Vaccine  Completed  . Hepatitis C Screening  Completed  . HIV Screening  Completed    Discussed health benefits of physical activity, and encouraged her to engage in regular exercise appropriate for her age and condition.  1. Annual physical exam Mildly obese. Encouraged regular exercise. Follow up annually. Up to date on mammogram.   2. Need for diphtheria-tetanus-pertussis (Tdap) vaccine -TDAP  3. Essential hypertension Well controlled.  Continue current medications.   - Tdap vaccine greater than or equal to 7yo IM - Comprehensive Metabolic Panel (CMET)  - EKG 12-Lead  4. Hyperlipidemia, unspecified hyperlipidemia type She is tolerating atorvastatin well with no adverse effects.    - Lipid panel - Direct LDL  5. Seborrheic dermatitis try- neomycin-polymyxin-hydrocortisone (CORTISPORIN) OTIC solution; Place 3 drops into both ears daily as needed.  Dispense: 10 mL; Refill: 3  6. Generalized headaches Does with occasional Fioricet which is refilled today.  - butalbital-acetaminophen-caffeine (FIORICET) 50-325-40 MG tablet; TAKE 1 TABLET BY MOUTH EVERY 6 HOURS AS NEEDED FOR HEADACHE.  Dispense: 40 tablet; Refill: 1     The entirety of the information documented in the History of Present Illness, Review of Systems and Physical Exam were personally obtained by me. Portions of this information were initially documented by the CMA and reviewed by me for thoroughness and accuracy.     Lelon Huh, MD  [facility] [phone] (phone) [phone] (fax)  [place]

## 2020-04-06 ENCOUNTER — Encounter: Payer: Self-pay | Admitting: Family Medicine

## 2020-04-06 ENCOUNTER — Ambulatory Visit (INDEPENDENT_AMBULATORY_CARE_PROVIDER_SITE_OTHER): Payer: 59 | Admitting: Family Medicine

## 2020-04-06 ENCOUNTER — Other Ambulatory Visit: Payer: Self-pay

## 2020-04-06 VITALS — BP 112/80 | HR 85 | Temp 97.3°F | Resp 16 | Wt 187.0 lb

## 2020-04-06 DIAGNOSIS — Z23 Encounter for immunization: Secondary | ICD-10-CM | POA: Diagnosis not present

## 2020-04-06 DIAGNOSIS — R519 Headache, unspecified: Secondary | ICD-10-CM | POA: Diagnosis not present

## 2020-04-06 DIAGNOSIS — E785 Hyperlipidemia, unspecified: Secondary | ICD-10-CM

## 2020-04-06 DIAGNOSIS — I10 Essential (primary) hypertension: Secondary | ICD-10-CM

## 2020-04-06 DIAGNOSIS — Z0000 Encounter for general adult medical examination without abnormal findings: Secondary | ICD-10-CM | POA: Diagnosis not present

## 2020-04-06 DIAGNOSIS — L219 Seborrheic dermatitis, unspecified: Secondary | ICD-10-CM | POA: Diagnosis not present

## 2020-04-06 MED ORDER — BUTALBITAL-APAP-CAFFEINE 50-325-40 MG PO TABS: ORAL_TABLET | ORAL | 1 refills | Status: DC

## 2020-04-06 MED ORDER — NEOMYCIN-POLYMYXIN-HC 3.5-10000-1 OT SOLN: 3.0000 [drp] | Freq: Every day | OTIC | 3 refills | Status: DC | PRN

## 2020-04-06 NOTE — Patient Instructions (Signed)
.   Please review the attached list of medications and notify my office if there are any errors.   . Please bring all of your medications to every appointment so we can make sure that our medication list is the same as yours.   . Please contact your eyecare professional to schedule a routine eye exam. I recommend seeing Dr. Jomarie Longs at [phone] or the [facility] at [phone]

## 2020-04-07 LAB — COMPREHENSIVE METABOLIC PANEL
ALT: 23 IU/L (ref 0–32)
AST: 27 IU/L (ref 0–40)
Albumin/Globulin Ratio: 1.7 (ref 1.2–2.2)
Albumin: 4.5 g/dL (ref 3.8–4.8)
Alkaline Phosphatase: 81 IU/L (ref 48–121)
BUN/Creatinine Ratio: 25 (ref 12–28)
BUN: 16 mg/dL (ref 8–27)
Bilirubin Total: 0.4 mg/dL (ref 0.0–1.2)
CO2: 22 mmol/L (ref 20–29)
Calcium: 10.1 mg/dL (ref 8.7–10.3)
Chloride: 101 mmol/L (ref 96–106)
Creatinine, Ser: 0.65 mg/dL (ref 0.57–1.00)
GFR calc Af Amer: 109 mL/min/{1.73_m2} (ref >59)
GFR calc non Af Amer: 95 mL/min/{1.73_m2} (ref >59)
Globulin, Total: 2.7 g/dL (ref 1.5–4.5)
Glucose: 102 mg/dL — ABNORMAL HIGH (ref 65–99)
Potassium: 4.4 mmol/L (ref 3.5–5.2)
Sodium: 140 mmol/L (ref 134–144)
Total Protein: 7.2 g/dL (ref 6.0–8.5)

## 2020-04-07 LAB — CBC
Hematocrit: 44.9 % (ref 34.0–46.6)
Hemoglobin: 14.6 g/dL (ref 11.1–15.9)
MCH: 30.4 pg (ref 26.6–33.0)
MCHC: 32.5 g/dL (ref 31.5–35.7)
MCV: 94 fL (ref 79–97)
Platelets: 353 10*3/uL (ref 150–450)
RBC: 4.8 x10E6/uL (ref 3.77–5.28)
RDW: 12.6 % (ref 11.7–15.4)
WBC: 8 10*3/uL (ref 3.4–10.8)

## 2020-04-07 LAB — LIPID PANEL
Chol/HDL Ratio: 3.1 ratio (ref 0.0–4.4)
Cholesterol, Total: 169 mg/dL (ref 100–199)
HDL: 55 mg/dL (ref >39)
LDL Chol Calc (NIH): 95 mg/dL (ref 0–99)
Triglycerides: 104 mg/dL (ref 0–149)
VLDL Cholesterol Cal: 19 mg/dL (ref 5–40)

## 2020-04-07 LAB — LDL CHOLESTEROL, DIRECT: LDL Direct: 105 mg/dL — ABNORMAL HIGH (ref 0–99)

## 2020-04-22 ENCOUNTER — Other Ambulatory Visit: Payer: Self-pay | Admitting: Family Medicine

## 2020-04-22 DIAGNOSIS — Z1231 Encounter for screening mammogram for malignant neoplasm of breast: Secondary | ICD-10-CM

## 2020-04-25 DIAGNOSIS — M1712 Unilateral primary osteoarthritis, left knee: Secondary | ICD-10-CM | POA: Diagnosis not present

## 2020-05-12 DIAGNOSIS — M1712 Unilateral primary osteoarthritis, left knee: Secondary | ICD-10-CM | POA: Diagnosis not present

## 2020-05-17 ENCOUNTER — Encounter: Payer: Self-pay | Admitting: Family Medicine

## 2020-05-20 DIAGNOSIS — M1712 Unilateral primary osteoarthritis, left knee: Secondary | ICD-10-CM | POA: Diagnosis not present

## 2020-05-25 ENCOUNTER — Ambulatory Visit
Admission: RE | Admit: 2020-05-25 | Discharge: 2020-05-25 | Disposition: A | Discharge status: Home / Self Care | Payer: 59 | Source: Ambulatory Visit | Attending: Family Medicine | Admitting: Family Medicine

## 2020-05-25 DIAGNOSIS — Z1231 Encounter for screening mammogram for malignant neoplasm of breast: Secondary | ICD-10-CM | POA: Diagnosis not present

## 2020-05-25 IMAGING — MG DIGITAL SCREENING BILAT W/ TOMO W/ CAD
6 of 10 series · 6 of 30 positions shown · non-contrast
Comparison: Previous exam(s).

CLINICAL DATA: Screening.

EXAM:
DIGITAL SCREENING BILATERAL MAMMOGRAM WITH TOMO AND CAD

[R MLO synth-2D]
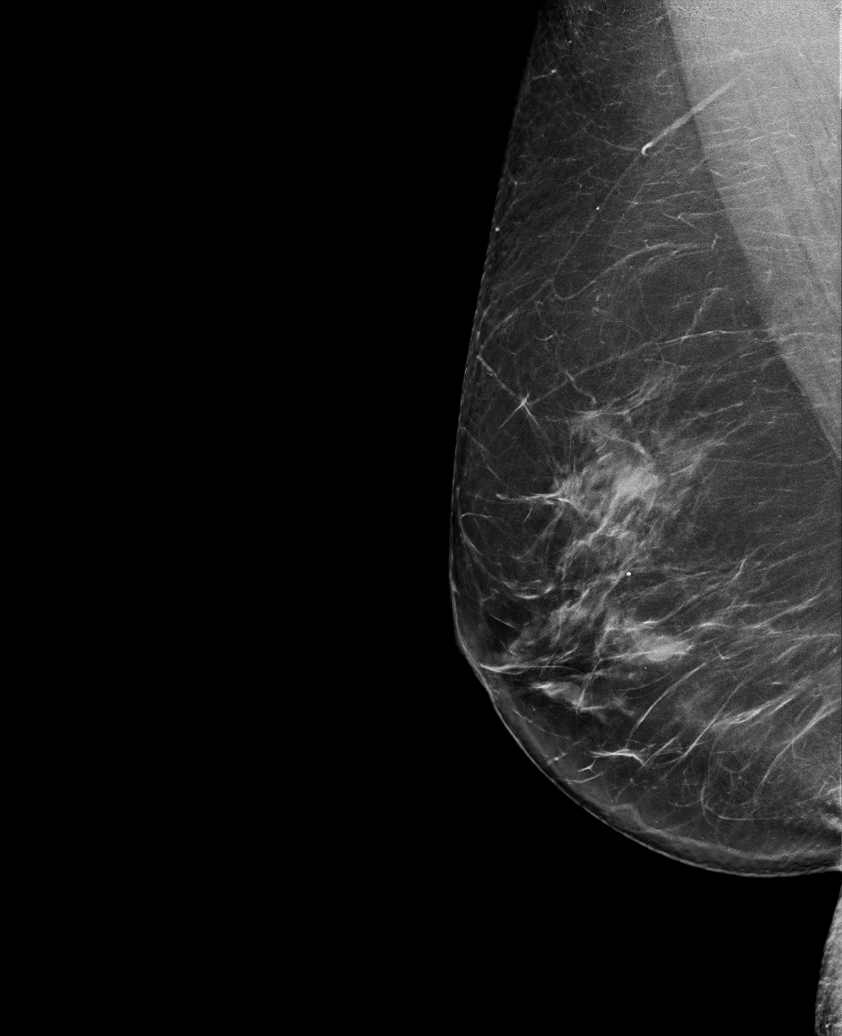

[L MLO synth-2D]
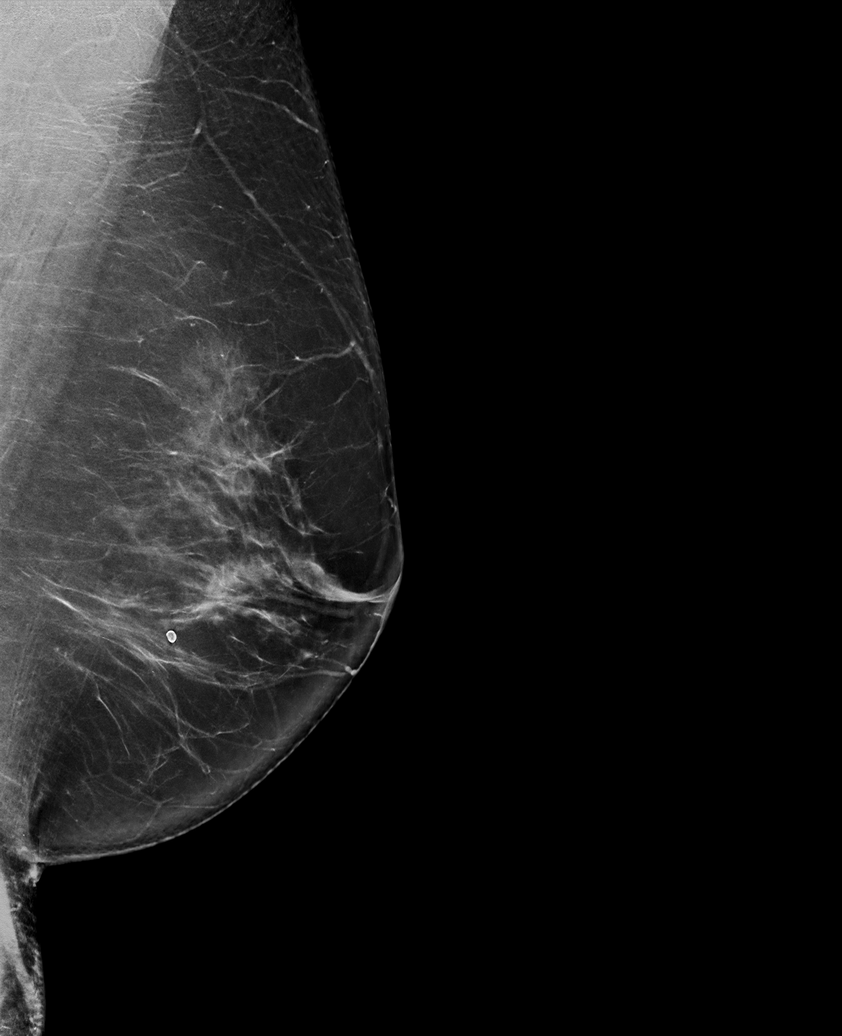

[L CC synth-2D (1 of 2)]
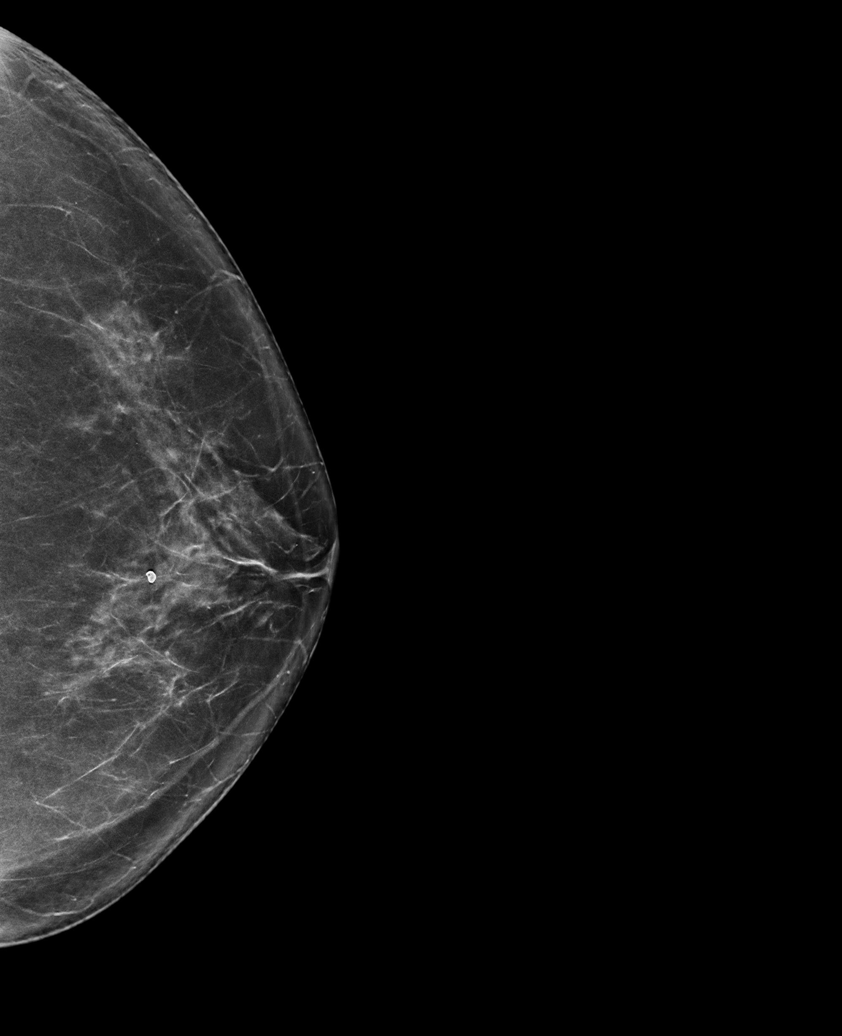

[R CC synth-2D]
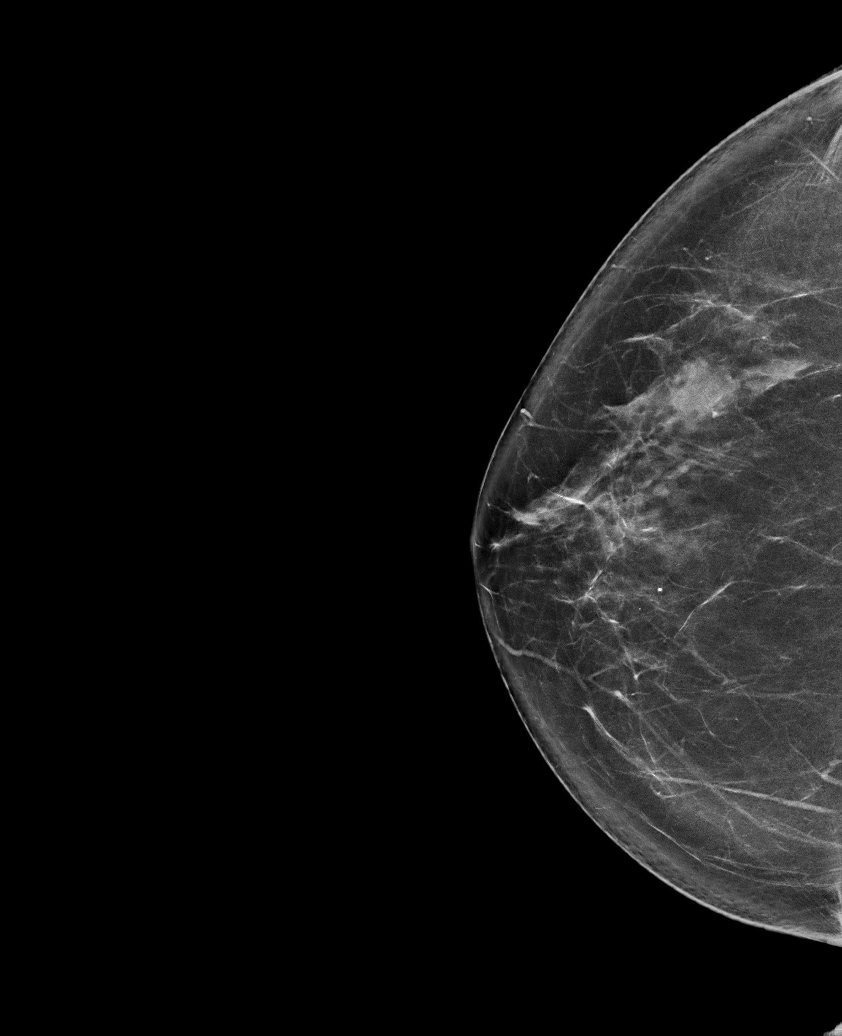

[L CC synth-2D (2 of 2)]
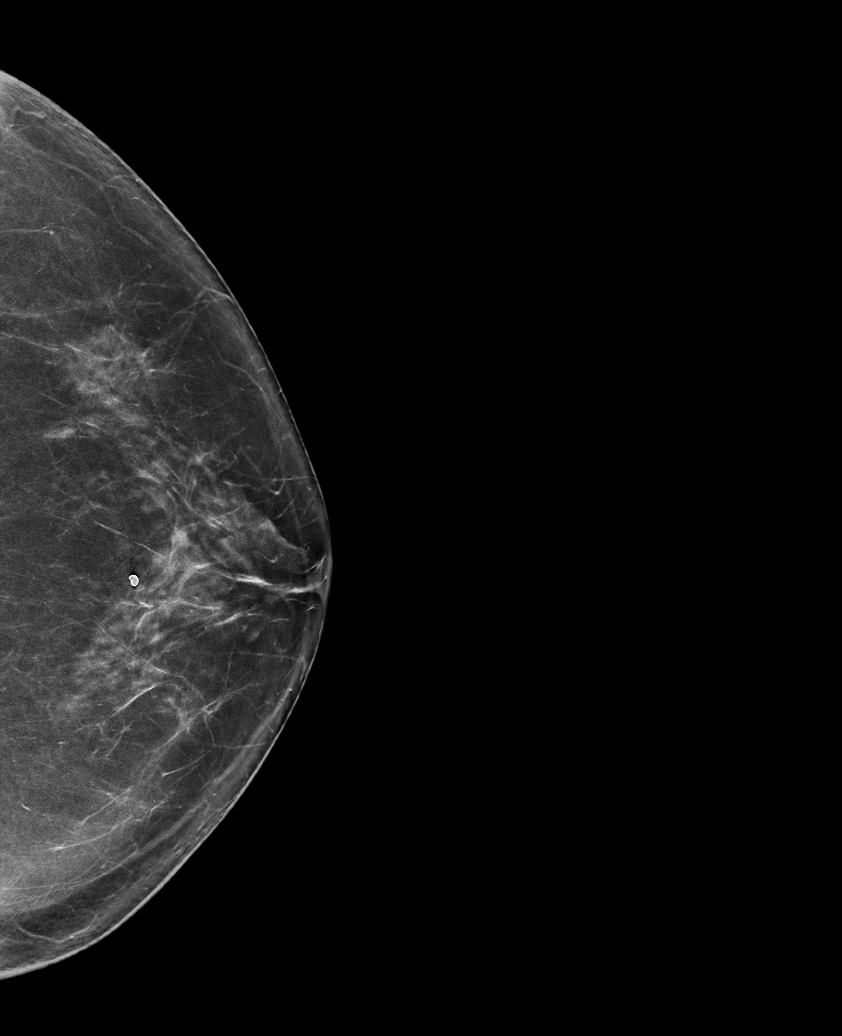

[L CC tomo · tomo slice 41/80.0]
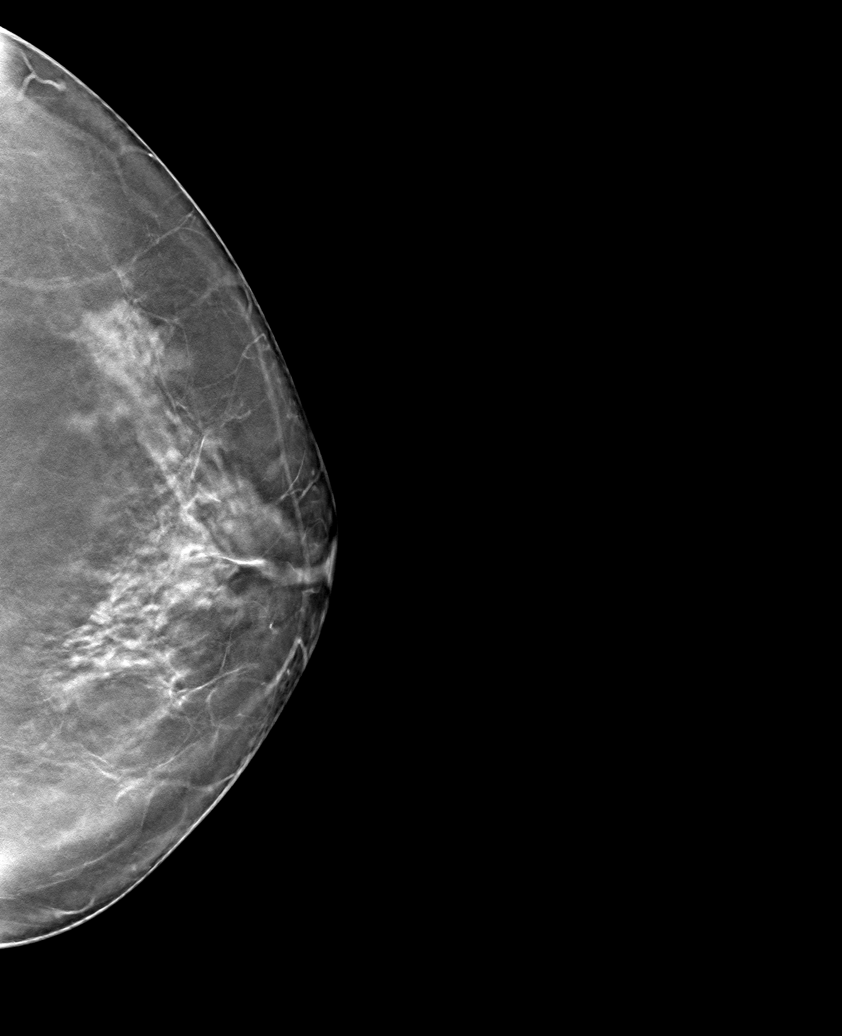

[6 of 30 positions shown; findings below may reference images not displayed]

ACR Breast Density Category b: There are scattered areas of
fibroglandular density.
FINDINGS: There are no findings suspicious for malignancy. Images were
processed with CAD.
IMPRESSION: No mammographic evidence of malignancy. A result letter of this
screening mammogram will be mailed directly to the patient.

RECOMMENDATION:
Screening mammogram in one year. (Code:[TQ])

BI-RADS CATEGORY  1: Negative.

## 2020-05-27 DIAGNOSIS — M1712 Unilateral primary osteoarthritis, left knee: Secondary | ICD-10-CM | POA: Diagnosis not present

## 2020-06-02 ENCOUNTER — Other Ambulatory Visit: Payer: Self-pay | Admitting: Family Medicine

## 2020-06-27 DIAGNOSIS — M1712 Unilateral primary osteoarthritis, left knee: Secondary | ICD-10-CM | POA: Diagnosis not present

## 2020-07-04 DIAGNOSIS — Z135 Encounter for screening for eye and ear disorders: Secondary | ICD-10-CM | POA: Diagnosis not present

## 2020-07-04 DIAGNOSIS — H524 Presbyopia: Secondary | ICD-10-CM | POA: Diagnosis not present

## 2020-07-04 DIAGNOSIS — H52223 Regular astigmatism, bilateral: Secondary | ICD-10-CM | POA: Diagnosis not present

## 2020-07-04 DIAGNOSIS — H5213 Myopia, bilateral: Secondary | ICD-10-CM | POA: Diagnosis not present

## 2020-07-27 ENCOUNTER — Other Ambulatory Visit: Payer: Self-pay | Admitting: Family Medicine

## 2020-07-27 NOTE — Telephone Encounter (Signed)
Requested medication (s) are due for refill today: Yes  Requested medication (s) are on the active medication list: Yes  Last refill:  Prozac 06/01/19  Hydrochlorothiazide  08/14/19  Lisinopril  08/14/19    Future visit scheduled: No  Notes to clinic:  Prozac has expired. Others expire in 2 weeks.    Requested Prescriptions  Pending Prescriptions Disp Refills   FLUoxetine (PROZAC) 40 MG capsule [Pharmacy Med Name: FLUoxetine HCL 40 MG CAPS 40 Capsule] 90 capsule 4    Sig: TAKE 1 CAPSULE BY MOUTH ONCE DAILY      Psychiatry:  Antidepressants - SSRI Passed - 07/27/2020 11:47 AM      Passed - Valid encounter within last 6 months    Recent Outpatient Visits           3 months ago Need for diphtheria-tetanus-pertussis (Tdap) vaccine   [facility] Birdie Sons, MD   1 year ago [place], Donald E, MD   1 year ago Upper respiratory tract infection, unspecified type   [facility] Birdie Sons, MD   2 years ago Essential hypertension   [facility] Birdie Sons, MD   2 years ago Laceration of forehead, initial encounter   [facility] Birdie Sons, MD                hydrochlorothiazide (MICROZIDE) 12.5 MG capsule [Pharmacy Med Name: HYDROCHLOROTHIAZIDE 12.5 MG 12.5 Capsule] 90 capsule 2    Sig: TAKE 1 CAPSULE BY MOUTH ONCE DAILY      Cardiovascular: Diuretics - Thiazide Passed - 07/27/2020 11:47 AM      Passed - Ca in normal range and within 360 days    Calcium  Date Value Ref Range Status  04/06/2020 10.1 8.7 - 10.3 mg/dL Final          Passed - Cr in normal range and within 360 days    Creatinine, Ser  Date Value Ref Range Status  04/06/2020 0.65 0.57 - 1.00 mg/dL Final          Passed - K in normal range and within 360 days    Potassium  Date Value Ref Range Status  04/06/2020 4.4 3.5 - 5.2 mmol/L Final          Passed - Na in normal range and within 360  days    Sodium  Date Value Ref Range Status  04/06/2020 140 134 - 144 mmol/L Final          Passed - Last BP in normal range    BP Readings from Last 1 Encounters:  04/06/20 112/80          Passed - Valid encounter within last 6 months    Recent Outpatient Visits           3 months ago Need for diphtheria-tetanus-pertussis (Tdap) vaccine   [facility] Birdie Sons, MD   1 year ago [place], Donald E, MD   1 year ago Upper respiratory tract infection, unspecified type   [facility] Birdie Sons, MD   2 years ago Essential hypertension   [facility] Birdie Sons, MD   2 years ago Laceration of forehead, initial encounter   [facility] Birdie Sons, MD                lisinopril (ZESTRIL) 40 MG tablet [Pharmacy Med Name: LISINOPRIL  40 MG TABLET 40 Tablet] 90 tablet 2    Sig: TAKE 1 TABLET BY MOUTH ONCE DAILY      Cardiovascular:  ACE Inhibitors Passed - 07/27/2020 11:47 AM      Passed - Cr in normal range and within 180 days    Creatinine, Ser  Date Value Ref Range Status  04/06/2020 0.65 0.57 - 1.00 mg/dL Final          Passed - K in normal range and within 180 days    Potassium  Date Value Ref Range Status  04/06/2020 4.4 3.5 - 5.2 mmol/L Final          Passed - Patient is not pregnant      Passed - Last BP in normal range    BP Readings from Last 1 Encounters:  04/06/20 112/80          Passed - Valid encounter within last 6 months    Recent Outpatient Visits           3 months ago Need for diphtheria-tetanus-pertussis (Tdap) vaccine   [facility] Birdie Sons, MD   1 year ago [place], Donald E, MD   1 year ago Upper respiratory tract infection, unspecified type   [facility] Birdie Sons, MD   2 years ago Essential hypertension   [facility]  Birdie Sons, MD   2 years ago Laceration of forehead, initial encounter   [facility] Birdie Sons, MD

## 2020-08-04 ENCOUNTER — Other Ambulatory Visit: Payer: Self-pay

## 2020-08-04 DIAGNOSIS — L219 Seborrheic dermatitis, unspecified: Secondary | ICD-10-CM

## 2020-08-04 MED ORDER — CLOBETASOL PROPIONATE 0.05 % EX SOLN: CUTANEOUS | 4 refills | Status: DC

## 2020-08-04 NOTE — Progress Notes (Signed)
Rx refill from [place].

## 2020-12-22 ENCOUNTER — Ambulatory Visit: Payer: 59 | Admitting: Dermatology

## 2020-12-22 ENCOUNTER — Other Ambulatory Visit: Payer: Self-pay

## 2020-12-22 DIAGNOSIS — L578 Other skin changes due to chronic exposure to nonionizing radiation: Secondary | ICD-10-CM

## 2020-12-22 DIAGNOSIS — L570 Actinic keratosis: Secondary | ICD-10-CM | POA: Diagnosis not present

## 2020-12-22 DIAGNOSIS — L821 Other seborrheic keratosis: Secondary | ICD-10-CM

## 2020-12-22 DIAGNOSIS — D1800 Hemangioma unspecified site: Secondary | ICD-10-CM

## 2020-12-22 DIAGNOSIS — L814 Other melanin hyperpigmentation: Secondary | ICD-10-CM

## 2020-12-22 DIAGNOSIS — D229 Melanocytic nevi, unspecified: Secondary | ICD-10-CM

## 2020-12-22 DIAGNOSIS — Z1283 Encounter for screening for malignant neoplasm of skin: Secondary | ICD-10-CM | POA: Diagnosis not present

## 2020-12-22 DIAGNOSIS — L219 Seborrheic dermatitis, unspecified: Secondary | ICD-10-CM | POA: Diagnosis not present

## 2020-12-22 DIAGNOSIS — L719 Rosacea, unspecified: Secondary | ICD-10-CM

## 2020-12-22 MED ORDER — CICLOPIROX OLAMINE 0.77 % EX SUSP: 1.0000 "application " | Freq: Every day | CUTANEOUS | 11 refills | Status: DC

## 2020-12-22 MED ORDER — KETOCONAZOLE 2 % EX CREA: 1.0000 "application " | TOPICAL_CREAM | Freq: Two times a day (BID) | CUTANEOUS | 0 refills | Status: AC

## 2020-12-22 MED ORDER — METRONIDAZOLE 0.75 % EX CREA: TOPICAL_CREAM | Freq: Two times a day (BID) | CUTANEOUS | 0 refills | Status: DC

## 2020-12-22 NOTE — Patient Instructions (Addendum)
Melanoma ABCDEs  Melanoma is the most dangerous type of skin cancer, and is the leading cause of death from skin disease.  You are more likely to develop melanoma if you:  Have light-colored skin, light-colored eyes, or red or blond hair  Spend a lot of time in the sun  Tan regularly, either outdoors or in a tanning bed  Have had blistering sunburns, especially during childhood  Have a close family member who has had a melanoma  Have atypical moles or large birthmarks  Early detection of melanoma is key since treatment is typically straightforward and cure rates are extremely high if we catch it early.   The first sign of melanoma is often a change in a mole or a new dark spot.  The ABCDE system is a way of remembering the signs of melanoma.  A for asymmetry:  The two halves do not match. B for border:  The edges of the growth are irregular. C for color:  A mixture of colors are present instead of an even brown color. D for diameter:  Melanomas are usually (but not always) greater than 48mm - the size of a pencil eraser. E for evolution:  The spot keeps changing in size, shape, and color.  Please check your skin once per month between visits. You can use a small mirror in front and a large mirror behind you to keep an eye on the back side or your body.   If you see any new or changing lesions before your next follow-up, please call to schedule a visit.  Please continue daily skin protection including broad spectrum sunscreen SPF 30+ to sun-exposed areas, reapplying every 2 hours as needed when you're outdoors.   Recommend taking Heliocare sun protection supplement daily in sunny weather for additional sun protection. For maximum protection on the sunniest days, you can take up to 2 capsules of regular Heliocare OR take 1 capsule of Heliocare Ultra. For prolonged exposure (such as a full day in the sun), you can repeat your dose of the supplement 4 hours after your first dose. Heliocare  can be purchased at [facility] or at [email or web address].   Cryotherapy Aftercare  . Wash gently with soap and water everyday.   Marland Kitchen Apply Vaseline and Band-Aid daily until healed.

## 2020-12-22 NOTE — Progress Notes (Signed)
Follow-Up Visit   Subjective  Stephanie Shah is a 65 y.o. female who presents for the following: Tbse (Patient here today for total body skin exam. Patient states she has no areas of concern today. ).  She does report itching of the scalp and rash at the face which is bothersome.  Patient here for full body skin exam and skin cancer screening.  The following portions of the chart were reviewed this encounter and updated as appropriate:  Tobacco  Allergies  Meds  Problems  Med Hx  Surg Hx  Fam Hx        Objective  Well appearing patient in no apparent distress; mood and affect are within normal limits.  A full examination was performed including scalp, head, eyes, ears, nose, lips, neck, chest, axillae, abdomen, back, buttocks, bilateral upper extremities, bilateral lower extremities, hands, feet, fingers, toes, fingernails, and toenails. All findings within normal limits unless otherwise noted below.  Objective  nose and forehead: Mid face erythema with telangiectasias and scattered inflammatory papules.   Objective  Nose x 1,  left cheek x 1 (2): Erythematous thin papules/macules with gritty scale.   Objective  bilateral eyebrows and nose: Scaly pink plaques at nose and eyebrows Scale and erythema at scalp  Assessment & Plan  Rosacea nose and forehead  Chronic condition with expected duration over one year. Condition is bothersome to patient. Currently flared.  Denies grittiness of eyes  Start Metronidazole 0.75 % cream apply 1 application twice daily   Rosacea is a chronic progressive skin condition usually affecting the face of adults, causing redness and/or acne bumps. It is treatable but not curable. It sometimes affects the eyes (ocular rosacea) as well. It may respond to topical and/or systemic medication and can flare with stress, sun exposure, alcohol, exercise and some foods.  Daily application of broad spectrum spf 30+ sunscreen to face is recommended to  reduce flares.   Actinic keratosis (2) Nose x 1,  left cheek x 1  Prior to procedure, discussed risks of blister formation, small wound, skin dyspigmentation, or rare scar following cryotherapy.    Destruction of lesion - Nose x 1,  left cheek x 1  Destruction method: cryotherapy   Informed consent: discussed and consent obtained   Lesion destroyed using liquid nitrogen: Yes   Cryotherapy cycles:  2 Outcome: patient tolerated procedure well with no complications   Post-procedure details: wound care instructions given    Seborrheic dermatitis bilateral eyebrows and nose  Chronic condition with duration over one year. Condition is bothersome to patient. Currently flared.  Ketonconazole cream for face use daily for nose and eyebrow  Cont Ketoconazole shampoo increase to three times per week, massage into scalp and leave in for 10 minutes before rinsing out  Start ciclopirox topical suspension daily  Continue clobetasol solution daily as needed to scalp. Avoid applying to face, groin, and axilla. Use as directed. Risk of skin atrophy with long-term use reviewed.   Topical steroids (such as triamcinolone, fluocinolone, fluocinonide, mometasone, clobetasol, halobetasol, betamethasone, hydrocortisone) can cause thinning and lightening of the skin if they are used for too long in the same area. Your physician has selected the right strength medicine for your problem and area affected on the body. Please use your medication only as directed by your physician to prevent side effects.    Ordered Medications: metroNIDAZOLE (METROCREAM) 0.75 % cream ketoconazole (NIZORAL) 2 % cream ciclopirox (LOPROX) 0.77 % SUSP  Other Related Medications clobetasol (TEMOVATE) 0.05 % external  solution  Lentigines - Scattered tan macules - Discussed due to sun exposure - Benign, observe - Call for any changes  Seborrheic Keratoses - Stuck-on, waxy, tan-brown papules and plaques  - Discussed benign  etiology and prognosis. - Observe - Call for any changes  Melanocytic Nevi - Tan-brown and/or pink-flesh-colored symmetric macules and papules - Benign appearing on exam today - Observation - Call clinic for new or changing moles - Recommend daily use of broad spectrum spf 30+ sunscreen to sun-exposed areas.   Hemangiomas - Red papules - Discussed benign nature - Observe - Call for any changes  Actinic Damage Neck and chest  - Chronic, secondary to cumulative UV/sun exposure - diffuse scaly erythematous macules with underlying dyspigmentation - Recommend daily broad spectrum sunscreen SPF 30+ to sun-exposed areas, reapply every 2 hours as needed.  - Call for new or changing lesions.  Skin cancer screening performed today.  Return in about 3 months (around 03/21/2021) for check on ak, 1 year tbse.  I, Ruthell Rummage, CMA, am acting as scribe for Forest Gleason, MD.  Documentation: I have reviewed the above documentation for accuracy and completeness, and I agree with the above.  Forest Gleason, MD

## 2020-12-26 ENCOUNTER — Encounter: Payer: Self-pay | Admitting: Dermatology

## 2020-12-27 ENCOUNTER — Encounter: Payer: Self-pay | Admitting: Family Medicine

## 2020-12-27 DIAGNOSIS — L719 Rosacea, unspecified: Secondary | ICD-10-CM | POA: Insufficient documentation

## 2021-01-30 ENCOUNTER — Encounter: Payer: Self-pay | Admitting: Dermatology

## 2021-01-30 DIAGNOSIS — L409 Psoriasis, unspecified: Secondary | ICD-10-CM

## 2021-01-31 ENCOUNTER — Other Ambulatory Visit: Payer: Self-pay

## 2021-01-31 ENCOUNTER — Ambulatory Visit (INDEPENDENT_AMBULATORY_CARE_PROVIDER_SITE_OTHER): Payer: 59 | Admitting: Dermatology

## 2021-01-31 DIAGNOSIS — Z872 Personal history of diseases of the skin and subcutaneous tissue: Secondary | ICD-10-CM

## 2021-01-31 DIAGNOSIS — B354 Tinea corporis: Secondary | ICD-10-CM | POA: Diagnosis not present

## 2021-01-31 DIAGNOSIS — L409 Psoriasis, unspecified: Secondary | ICD-10-CM | POA: Diagnosis not present

## 2021-01-31 NOTE — Patient Instructions (Addendum)
Psoriasis - Start clobetasol solution 1-2 times daily as needed to scalp.  Start clobetasol solution 1-2 times daily as needed to ears for up to 2 weeks and then weekends only. Avoid applying to face, groin, and axilla. Use as directed. Risk of skin atrophy with long-term use reviewed.   Start hydrocortisone 2.5% cream twice daily to face for up to 2 weeks as needed.  Recommend T-Sal shampoo apply three times per week, massage into scalp and leave in for 10 minutes before rinsing out.   Psoriasis is a chronic non-curable, but treatable genetic/hereditary disease that may have other systemic features affecting other organ systems such as joints (Psoriatic Arthritis). It is associated with an increased risk of inflammatory bowel disease, heart disease, non-alcoholic fatty liver disease, and depression.    If you have any questions or concerns for your doctor, please call our main line at [phone] and press option 4 to reach your doctor's medical assistant. If no one answers, please leave a voicemail as directed and we will return your call as soon as possible. Messages left after 4 pm will be answered the following business day.   You may also send [place] a message via [place]. We typically respond to MyChart messages within 1-2 business days.  For prescription refills, please ask your pharmacy to contact our office. Our fax number is [phone].  If you have an urgent issue when the clinic is closed that cannot wait until the next business day, you can page your doctor at the number below.    Please note that while we do our best to be available for urgent issues outside of office hours, we are not available 24/7.   If you have an urgent issue and are unable to reach [place], you may choose to seek medical care at your doctor's office, retail clinic, urgent care center, or emergency room.  If you have a medical emergency, please immediately call 911 or go to the emergency department.  Pager  Numbers  - Dr. Nehemiah Massed: [phone]  - Dr. Laurence Ferrari: [phone]  - Dr. Nicole Kindred: [phone]  In the event of inclement weather, please call our main line at [phone] for an update on the status of any delays or closures.  Dermatology Medication Tips: Please keep the boxes that topical medications come in in order to help keep track of the instructions about where and how to use these. Pharmacies typically print the medication instructions only on the boxes and not directly on the medication tubes.   If your medication is too expensive, please contact our office at [phone] option 4 or send [place] a message through [place].   We are unable to tell what your co-pay for medications will be in advance as this is different depending on your insurance coverage. However, we may be able to find a substitute medication at lower cost or fill out paperwork to get insurance to cover a needed medication.   If a prior authorization is required to get your medication covered by your insurance company, please allow [place] 1-2 business days to complete this process.  Drug prices often vary depending on where the prescription is filled and some pharmacies may offer cheaper prices.  The website www.goodrx.com contains coupons for medications through different pharmacies. The prices here do not account for what the cost may be with help from insurance (it may be cheaper with your insurance), but the website can give you the price if you did not use any insurance.  - You can  print the associated coupon and take it with your prescription to the pharmacy.  - You may also stop by our office during regular business hours and pick up a GoodRx coupon card.  - If you need your prescription sent electronically to a different pharmacy, notify our office through [facility] or by phone at [phone] option 4.

## 2021-01-31 NOTE — Telephone Encounter (Signed)
Discussed with patient Dr. Laurence Ferrari would like to see her at 2:15pm today 01/31/21. Patient agrees to be seen today.

## 2021-01-31 NOTE — Progress Notes (Signed)
   Follow-Up Visit   Subjective  Stephanie Shah is a 65 y.o. female who presents for the following: Skin Problem (Pt c/o spots ring worm appearing spots popping up on her arms. Also with thick scaly itchy areas at her head and ears ).   The following portions of the chart were reviewed this encounter and updated as appropriate:   Tobacco  Allergies  Meds  Problems  Med Hx  Surg Hx  Fam Hx      Review of Systems:  No other skin or systemic complaints except as noted in HPI or Assessment and Plan.  Objective  Well appearing patient in no apparent distress; mood and affect are within normal limits.  A focused examination was performed including scalp, ears, face, arms. Relevant physical exam findings are noted in the Assessment and Plan.  Objective  Right antecubital fossa: Scaly pink annular plaque, resolving  Objective  scalp, ears: Scaly pink plaques bilateral conchal bowls, scalp, left cheek   Assessment & Plan  Tinea corporis Right antecubital fossa  Continue ciclopirox twice daily as prescribed  Psoriasis scalp, ears  Chronic condition with expected duration over one year. Condition is bothersome to patient. Currently flared.  Patient does have some stiffness in the morning but resolves within 15 minutes. Stable joint pain left knee, right hip. Will monitor.   Start clobetasol solution 1-2 times daily to scalp. Start clobetasol solution 1-2 times daily to ears for up to 2 weeks then weekends only. Avoid applying to face, groin, and axilla. Use as directed. Risk of skin atrophy with long-term use reviewed.   Start HC 2.5% cream twice daily to face for up to 2 weeks as needed.  Recommend T-Sal shampoo apply three times per week, massage into scalp and leave in for 10 minutes before rinsing out  Consider adding calcipotriene if not clearing with topical steroid  Reviewed chronic nature. Reviewed link with psoriatic arthritis including the risk of damage to  joints and need for early treatment. Reviewed the risk of heart disease, inflammatory bowel disease and depression.   Topical steroids (such as triamcinolone, fluocinolone, fluocinonide, mometasone, clobetasol, halobetasol, betamethasone, hydrocortisone) can cause thinning and lightening of the skin if they are used for too long in the same area. Your physician has selected the right strength medicine for your problem and area affected on the body. Please use your medication only as directed by your physician to prevent side effects.    History of PreCancerous Actinic Keratosis  - site(s) of PreCancerous Actinic Keratosis clear today at nose. - these may recur and new lesions may form requiring treatment to prevent transformation into skin cancer - observe for new or changing spots and contact [place] for appointment if occur - photoprotection with sun protective clothing; sunglasses and broad spectrum sunscreen with SPF of at least 30 + and frequent self skin exams recommended - yearly exams by a dermatologist recommended for persons with history of PreCancerous Actinic Keratoses  Return for Psoriasis as scheduled.  [name], RMA, am acting as scribe for Forest Gleason, MD .  Documentation: I have reviewed the above documentation for accuracy and completeness, and I agree with the above.  Forest Gleason, MD

## 2021-02-01 ENCOUNTER — Encounter: Payer: Self-pay | Admitting: Dermatology

## 2021-03-23 ENCOUNTER — Other Ambulatory Visit: Payer: Self-pay | Admitting: Family Medicine

## 2021-03-23 NOTE — Telephone Encounter (Signed)
Requested Prescriptions  Pending Prescriptions Disp Refills  . atorvastatin (LIPITOR) 20 MG tablet [Pharmacy Med Name: ATORVASTATIN 20 MG TABLET] 90 tablet 2    Sig: TAKE ONE TABLET BY MOUTH DAILY     Cardiovascular:  Antilipid - Statins Passed - 03/23/2021  8:57 AM      Passed - Total Cholesterol in normal range and within 360 days    Cholesterol, Total  Date Value Ref Range Status  04/06/2020 169 100 - 199 mg/dL Final         Passed - LDL in normal range and within 360 days    LDL Chol Calc (NIH)  Date Value Ref Range Status  04/06/2020 95 0 - 99 mg/dL Final   LDL Direct  Date Value Ref Range Status  04/06/2020 105 (H) 0 - 99 mg/dL Final         Passed - HDL in normal range and within 360 days    HDL  Date Value Ref Range Status  04/06/2020 55 >39 mg/dL Final         Passed - Triglycerides in normal range and within 360 days    Triglycerides  Date Value Ref Range Status  04/06/2020 104 0 - 149 mg/dL Final         Passed - Patient is not pregnant      Passed - Valid encounter within last 12 months    Recent Outpatient Visits          11 months ago Need for diphtheria-tetanus-pertussis (Tdap) vaccine   [facility] Birdie Sons, MD   2 years ago [place], Donald E, MD   2 years ago Upper respiratory tract infection, unspecified type   [facility] Birdie Sons, MD   2 years ago Essential hypertension   [facility] Birdie Sons, MD   2 years ago Laceration of forehead, initial encounter   [place], Kirstie Peri, MD      Future Appointments            In 1 week [facility], [place], MD [place]

## 2021-03-30 ENCOUNTER — Ambulatory Visit: Payer: 59 | Admitting: Dermatology

## 2021-03-30 ENCOUNTER — Other Ambulatory Visit: Payer: Self-pay

## 2021-03-30 ENCOUNTER — Encounter: Payer: Self-pay | Admitting: Dermatology

## 2021-03-30 DIAGNOSIS — L219 Seborrheic dermatitis, unspecified: Secondary | ICD-10-CM | POA: Diagnosis not present

## 2021-03-30 DIAGNOSIS — L409 Psoriasis, unspecified: Secondary | ICD-10-CM

## 2021-03-30 DIAGNOSIS — L719 Rosacea, unspecified: Secondary | ICD-10-CM | POA: Diagnosis not present

## 2021-03-30 MED ORDER — CLOBETASOL PROPIONATE 0.05 % EX SOLN
CUTANEOUS | 4 refills | Status: DC
  Filled 2021-03-30: qty 50, 30d supply, fill #0

## 2021-03-30 NOTE — Progress Notes (Signed)
   Follow-Up Visit   Subjective  Stephanie Shah is a 65 y.o. female who presents for the following: Psoriasis (6 week recheck. Scalp and ears. Clobetasol solution is working well. Improved. C/O joint pain in hands/fingers.) and Rosacea ([place] Using Metronidazole 0.75% cream. ).  The following portions of the chart were reviewed this encounter and updated as appropriate:  Tobacco  Allergies  Meds  Problems  Med Hx  Surg Hx  Fam Hx      Review of Systems: No other skin or systemic complaints except as noted in HPI or Assessment and Plan.   Objective  Well appearing patient in no apparent distress; mood and affect are within normal limits.  A focused examination was performed including head, including the scalp, face, neck, nose, ears, eyelids, and lips. Relevant physical exam findings are noted in the Assessment and Plan.  Objective  Scalp: Rare scaly pink plaque at scalp  Objective  chin, central face: Mid face erythema with telangiectasias   Objective  Head - Anterior (Face): Erythema at face  Assessment & Plan  Psoriasis Scalp  Chronic condition with duration over one year. Currently well-controlled.  Continue Clobetasol sol QD-BID up to 2 weeks PRN flares.  Pt with joint pain. Will refer to rheumatology for evaluation  Psoriasis is a chronic non-curable, but treatable genetic/hereditary disease that may have other systemic features affecting other organ systems such as joints (Psoriatic Arthritis). It is associated with an increased risk of inflammatory bowel disease, heart disease, non-alcoholic fatty liver disease, and depression.     Other Related Medications clobetasol (TEMOVATE) 0.05 % external solution  Rosacea chin, central face  Chronic condition with duration over one year. Currently well-controlled.  Continue Metronidazole cream bid   Seborrheic dermatitis Head - Anterior (Face)  Chronic condition with duration or expected duration over one  year. Currently well-controlled.  Use HC 2.5% cream BID PRN flares 1-2 weeks. If needing often, pt will let us know and we would switch to ketoconazole or elidel  Other Related Medications metroNIDAZOLE (METROCREAM) 0.75 % cream ciclopirox (LOPROX) 0.77 % SUSP  Return in about 9 months (around 12/31/2021) for TBSE.   I, Emelia Salisbury, RMA, scribed for Alfonso Patten, MD.  Documentation: I have reviewed the above documentation for accuracy and completeness, and I agree with the above.  Forest Gleason, MD

## 2021-03-30 NOTE — Patient Instructions (Signed)
Topical steroids (such as triamcinolone, fluocinolone, fluocinonide, mometasone, clobetasol, halobetasol, betamethasone, hydrocortisone) can cause thinning and lightening of the skin if they are used for too long in the same area. Your physician has selected the right strength medicine for your problem and area affected on the body. Please use your medication only as directed by your physician to prevent side effects.    If you have any questions or concerns for your doctor, please call our main line at [phone] and press option 4 to reach your doctor's medical assistant. If no one answers, please leave a voicemail as directed and we will return your call as soon as possible. Messages left after 4 pm will be answered the following business day.   You may also send us a message via MyChart. We typically respond to MyChart messages within 1-2 business days.  For prescription refills, please ask your pharmacy to contact our office. Our fax number is [phone].  If you have an urgent issue when the clinic is closed that cannot wait until the next business day, you can page your doctor at the number below.    Please note that while we do our best to be available for urgent issues outside of office hours, we are not available 24/7.   If you have an urgent issue and are unable to reach us, you may choose to seek medical care at your doctor's office, retail clinic, urgent care center, or emergency room.  If you have a medical emergency, please immediately call 911 or go to the emergency department.  Pager Numbers  - Dr. Kowalski: [phone]  - Dr. Moye: [phone]  - Dr. Stewart: [phone]  In the event of inclement weather, please call our main line at [phone] for an update on the status of any delays or closures.  Dermatology Medication Tips: Please keep the boxes that topical medications come in in order to help keep track of the instructions about where and how to use  these. Pharmacies typically print the medication instructions only on the boxes and not directly on the medication tubes.   If your medication is too expensive, please contact our office at [phone] option 4 or send us a message through MyChart.   We are unable to tell what your co-pay for medications will be in advance as this is different depending on your insurance coverage. However, we may be able to find a substitute medication at lower cost or fill out paperwork to get insurance to cover a needed medication.   If a prior authorization is required to get your medication covered by your insurance company, please allow us 1-2 business days to complete this process.  Drug prices often vary depending on where the prescription is filled and some pharmacies may offer cheaper prices.  The website www.goodrx.com contains coupons for medications through different pharmacies. The prices here do not account for what the cost may be with help from insurance (it may be cheaper with your insurance), but the website can give you the price if you did not use any insurance.  - You can print the associated coupon and take it with your prescription to the pharmacy.  - You may also stop by our office during regular business hours and pick up a GoodRx coupon card.  - If you need your prescription sent electronically to a different pharmacy, notify our office through [facility] MyChart or by phone at [phone] option 4.  

## 2021-03-31 ENCOUNTER — Other Ambulatory Visit: Payer: Self-pay

## 2021-03-31 MED ORDER — CLOBETASOL PROPIONATE 0.05 % EX SOLN: CUTANEOUS | 4 refills | Status: DC

## 2021-03-31 NOTE — Addendum Note (Signed)
Addended by: Rudell Cobb A on: 03/31/2021 09:02 AM   Modules accepted: Orders

## 2021-04-03 ENCOUNTER — Encounter: Payer: Self-pay | Admitting: Dermatology

## 2021-04-05 ENCOUNTER — Encounter: Payer: Self-pay | Admitting: Dermatology

## 2021-04-18 ENCOUNTER — Encounter: Payer: Self-pay | Admitting: Dermatology

## 2021-04-19 ENCOUNTER — Other Ambulatory Visit: Payer: Self-pay

## 2021-04-19 DIAGNOSIS — L409 Psoriasis, unspecified: Secondary | ICD-10-CM

## 2021-04-25 NOTE — Progress Notes (Signed)
Established patient visit   Patient: Stephanie Shah   DOB: 02/18/1956   65 y.o. Female  MRN: [id number] Visit Date: 04/26/2021  Today's healthcare provider: Lelon Huh, MD   Chief Complaint  Patient presents with   Hypertension   Headache   Subjective    HPI  Lipid/Cholesterol, Follow-up  Last lipid panel Other pertinent labs  Lab Results  Component Value Date   CHOL 169 04/06/2020   HDL 55 04/06/2020   LDLCALC 95 04/06/2020   LDLDIRECT 105 (H) 04/06/2020   TRIG 104 04/06/2020   CHOLHDL 3.1 04/06/2020   Lab Results  Component Value Date   ALT 23 04/06/2020   AST 27 04/06/2020   PLT 353 04/06/2020   TSH 1.230 02/24/2018     She was last seen for this 1  year  ago.  Management since that visit includes continue same medication.  She reports good compliance with treatment. She is not having side effects.   Symptoms: No chest pain No chest pressure/discomfort  No dyspnea No lower extremity edema  No numbness or tingling of extremity No orthopnea  No palpitations No paroxysmal nocturnal dyspnea  No speech difficulty No syncope   Current diet: in general, an "unhealthy" diet Current exercise: none  The 10-year ASCVD risk score Mikey Bussing DC Jr., et al., 2013) is: 6.7%  ---------------------------------------------------------------------------------------------------   Hypertension, follow-up  BP Readings from Last 3 Encounters:  04/26/21 134/83  04/06/20 112/80  01/30/19 (!) 146/96   Wt Readings from Last 3 Encounters:  04/26/21 186 lb 12.8 oz (84.7 kg)  04/06/20 187 lb (84.8 kg)  01/30/19 180 lb 3.2 oz (81.7 kg)     She was last seen for hypertension 1  year  ago.  BP at that visit was 112/80. Management since that visit includes continue same medication.  She reports good compliance with treatment. She is not having side effects.  She is following a Regular diet. She is not exercising. She does not smoke.  Use of agents associated with  hypertension: NSAIDS.   Outside blood pressures are not checked. Symptoms: No chest pain No chest pressure  No palpitations No syncope  No dyspnea No orthopnea  No paroxysmal nocturnal dyspnea No lower extremity edema   Pertinent labs: Lab Results  Component Value Date   CHOL 169 04/06/2020   HDL 55 04/06/2020   LDLCALC 95 04/06/2020   LDLDIRECT 105 (H) 04/06/2020   TRIG 104 04/06/2020   CHOLHDL 3.1 04/06/2020   Lab Results  Component Value Date   NA 140 04/06/2020   K 4.4 04/06/2020   CREATININE 0.65 04/06/2020   GFRNONAA 95 04/06/2020   GFRAA 109 04/06/2020   GLUCOSE 102 (H) 04/06/2020     The 10-year ASCVD risk score Mikey Bussing DC Jr., et al., 2013) is: 6.7%   ---------------------------------------------------------------------------------------------------   Follow up for generalized headaches:  The patient was last seen for this 1  year  ago. Changes made at last visit include none. She was doing well with occasional Fioricet which is refilled during that visit.  She reports good compliance with treatment. She feels that condition is Unchanged. She is not having side effects.   -----------------------------------------------------------------------------------------      Medications: Outpatient Medications Prior to Visit  Medication Sig   aspirin EC 81 MG tablet Take 81 mg by mouth daily.   atorvastatin (LIPITOR) 20 MG tablet TAKE ONE TABLET BY MOUTH DAILY   butalbital-acetaminophen-caffeine (FIORICET) 50-325-40 MG tablet TAKE 1 TABLET BY MOUTH  EVERY 6 HOURS AS NEEDED FOR HEADACHE.   clobetasol (TEMOVATE) 0.05 % external solution Apply a small amount to scalp twice a day as needed.   FLUoxetine (PROZAC) 40 MG capsule TAKE 1 CAPSULE BY MOUTH ONCE DAILY   hydrochlorothiazide (MICROZIDE) 12.5 MG capsule TAKE 1 CAPSULE BY MOUTH ONCE DAILY   lisinopril (ZESTRIL) 40 MG tablet TAKE 1 TABLET BY MOUTH ONCE DAILY   metroNIDAZOLE (METROCREAM) 0.75 % cream Apply topically  in the morning and at bedtime.   Multiple Vitamin (MULTIVITAMIN) tablet Take 1 tablet by mouth daily.   [DISCONTINUED] ciclopirox (LOPROX) 0.77 % SUSP Apply 1 application topically daily. To scalp. Can also be used for dandruff at creases of nose and at eyebrows.   [DISCONTINUED] ketoconazole (NIZORAL) 2 % shampoo Apply 1 application topically once a week.    No facility-administered medications prior to visit.    Review of Systems  Constitutional:  Negative for appetite change, chills, fatigue and fever.  Respiratory:  Negative for chest tightness and shortness of breath.   Cardiovascular:  Negative for chest pain and palpitations.  Gastrointestinal:  Negative for abdominal pain, nausea and vomiting.  Neurological:  Negative for dizziness and weakness.      Objective    BP 134/83 (BP Location: Left Arm, Patient Position: Sitting, Cuff Size: Normal)   Pulse 76   Temp 97.7 F (36.5 C) (Temporal)   Resp 18   Ht 5\' 1"  (1.549 m)   Wt 186 lb 12.8 oz (84.7 kg)   BMI 35.30 kg/m     Physical Exam   General: Appearance:    Obese female in no acute distress  Eyes:    PERRL, conjunctiva/corneas clear, EOM's intact       Lungs:     Clear to auscultation bilaterally, respirations unlabored  Heart:    Normal heart rate. Normal rhythm. No murmurs, rubs, or gallops.    MS:   All extremities are intact.    Neurologic:   Awake, alert, oriented x 3. No apparent focal neurological           defect.         Assessment & Plan     1. Primary hypertension Very well controlled.  - Hemoglobin A1c  2. Hyperlipidemia, unspecified hyperlipidemia type She is tolerating atorvastatin well with no adverse effects.   - Renal function panel - Lipid panel   Her rheumatologist recently prescribed methotrexate for suspected psoriatic arthritis and patient is concerned about interaction with alcohol counseled that both alcohol and methotrexate can affect liver and should limit EtOh to 1-2 servings a  day and have LFTs monitored.      The entirety of the information documented in the History of Present Illness, Review of Systems and Physical Exam were personally obtained by me. Portions of this information were initially documented by the CMA and reviewed by me for thoroughness and accuracy.     Lelon Huh, MD  [facility] [phone] (phone) [phone] (fax)  [place]

## 2021-04-26 ENCOUNTER — Encounter: Payer: Self-pay | Admitting: Family Medicine

## 2021-04-26 ENCOUNTER — Other Ambulatory Visit: Payer: Self-pay

## 2021-04-26 ENCOUNTER — Ambulatory Visit (INDEPENDENT_AMBULATORY_CARE_PROVIDER_SITE_OTHER): Payer: 59 | Admitting: Family Medicine

## 2021-04-26 VITALS — BP 134/83 | HR 76 | Temp 97.7°F | Resp 18 | Ht 61.0 in | Wt 186.8 lb

## 2021-04-26 DIAGNOSIS — E785 Hyperlipidemia, unspecified: Secondary | ICD-10-CM | POA: Diagnosis not present

## 2021-04-26 DIAGNOSIS — I10 Essential (primary) hypertension: Secondary | ICD-10-CM

## 2021-04-27 LAB — RENAL FUNCTION PANEL
Albumin: 4.5 g/dL (ref 3.8–4.8)
BUN/Creatinine Ratio: 29 — ABNORMAL HIGH (ref 12–28)
BUN: 16 mg/dL (ref 8–27)
CO2: 24 mmol/L (ref 20–29)
Calcium: 9.7 mg/dL (ref 8.7–10.3)
Chloride: 99 mmol/L (ref 96–106)
Creatinine, Ser: 0.55 mg/dL — ABNORMAL LOW (ref 0.57–1.00)
Glucose: 94 mg/dL (ref 65–99)
Phosphorus: 3.5 mg/dL (ref 3.0–4.3)
Potassium: 4.5 mmol/L (ref 3.5–5.2)
Sodium: 140 mmol/L (ref 134–144)
eGFR: 102 mL/min/{1.73_m2} (ref >59)

## 2021-04-27 LAB — LIPID PANEL
Chol/HDL Ratio: 3 ratio (ref 0.0–4.4)
Cholesterol, Total: 186 mg/dL (ref 100–199)
HDL: 61 mg/dL
LDL Chol Calc (NIH): 108 mg/dL — ABNORMAL HIGH (ref 0–99)
Triglycerides: 95 mg/dL (ref 0–149)
VLDL Cholesterol Cal: 17 mg/dL (ref 5–40)

## 2021-04-27 LAB — HEMOGLOBIN A1C
Est. average glucose Bld gHb Est-mCnc: 123 mg/dL
Hgb A1c MFr Bld: 5.9 % — ABNORMAL HIGH (ref 4.8–5.6)

## 2021-04-30 ENCOUNTER — Encounter: Payer: Self-pay | Admitting: Family Medicine

## 2021-04-30 DIAGNOSIS — U071 COVID-19: Secondary | ICD-10-CM

## 2021-05-01 MED ORDER — NIRMATRELVIR/RITONAVIR (PAXLOVID)TABLET
3.0000 | ORAL_TABLET | Freq: Two times a day (BID) | ORAL | 0 refills | Status: AC
Start: 2021-05-01 — End: 2021-05-06

## 2021-05-16 ENCOUNTER — Other Ambulatory Visit: Payer: Self-pay | Admitting: Family Medicine

## 2021-07-03 ENCOUNTER — Other Ambulatory Visit: Payer: Self-pay

## 2021-07-03 ENCOUNTER — Encounter: Payer: Self-pay | Admitting: Family Medicine

## 2021-07-03 DIAGNOSIS — R519 Headache, unspecified: Secondary | ICD-10-CM

## 2021-07-03 NOTE — Telephone Encounter (Signed)
Please advise on refill request.   Last refill given on 04/06/2020, qty of 40 tablets, with 1 refill.   Last office visit: 04/26/2021

## 2021-07-04 MED ORDER — BUTALBITAL-APAP-CAFFEINE 50-325-40 MG PO TABS: ORAL_TABLET | ORAL | 3 refills | Status: AC

## 2021-08-16 ENCOUNTER — Other Ambulatory Visit: Payer: Self-pay

## 2021-08-16 ENCOUNTER — Ambulatory Visit: Payer: 59 | Admitting: Dermatology

## 2021-08-16 DIAGNOSIS — R21 Rash and other nonspecific skin eruption: Secondary | ICD-10-CM

## 2021-08-16 DIAGNOSIS — L409 Psoriasis, unspecified: Secondary | ICD-10-CM

## 2021-08-16 NOTE — Patient Instructions (Signed)

## 2021-08-16 NOTE — Progress Notes (Signed)
   Follow-Up Visit   Subjective  Stephanie Shah is a 65 y.o. female who presents for the following: Rash (Patient here today for rash. Celobrix and methotrexate and started noticing rash at right upper thigh and left inframmary. ).  Recommend stopping medication   The following portions of the chart were reviewed this encounter and updated as appropriate:  Tobacco  Allergies  Meds  Problems  Med Hx  Surg Hx  Fam Hx      Review of Systems: No other skin or systemic complaints except as noted in HPI or Assessment and Plan.   Objective  Well appearing patient in no apparent distress; mood and affect are within normal limits.  A focused examination was performed including chest. Relevant physical exam findings are noted in the Assessment and Plan.  Right Antecubital Fossa   right upper thigh and left inframmamary Scaly pink plaque left inframammary   Assessment & Plan  Psoriasis Right Antecubital Fossa  On methotrexate for psoriatic arthritis and psoriasis  Patient advised rash at chest not related to methotrexate, continue as prescribed by rheumatology  Related Medications clobetasol (TEMOVATE) 0.05 % external solution Apply a small amount to scalp twice a day as needed.  Rash and other nonspecific skin eruption right upper thigh and left inframmamary  KOH positive for fungal elements  Start Ketoconazole cream twice a daily until clear, then for one more week; call if not clearing  Return for keep follow up as scheduled . I, Ruthell Rummage, CMA, am acting as scribe for Forest Gleason, MD.  Documentation: I have reviewed the above documentation for accuracy and completeness, and I agree with the above.  Forest Gleason, MD

## 2021-08-21 ENCOUNTER — Other Ambulatory Visit: Payer: Self-pay | Admitting: Family Medicine

## 2021-08-21 DIAGNOSIS — Z1231 Encounter for screening mammogram for malignant neoplasm of breast: Secondary | ICD-10-CM

## 2021-08-22 ENCOUNTER — Other Ambulatory Visit: Payer: Self-pay

## 2021-08-22 DIAGNOSIS — L719 Rosacea, unspecified: Secondary | ICD-10-CM

## 2021-08-22 MED ORDER — IVERMECTIN 1 % EX CREA: 1.0000 "application " | TOPICAL_CREAM | Freq: Every evening | CUTANEOUS | 2 refills | Status: DC

## 2021-08-23 ENCOUNTER — Telehealth: Payer: Self-pay

## 2021-08-23 DIAGNOSIS — L719 Rosacea, unspecified: Secondary | ICD-10-CM

## 2021-08-23 NOTE — Telephone Encounter (Signed)
Ok please let the patient know. Unfortunately 4 of these medicines are all the same medicine just in different formulations (cream vs lotion vs gel), and the other, Mirvaso, is really to treat redness more than bumps.  I recommend switching to metronidazole gel (I believe she has be on the cream) twice a day and mirvaso qam... If that doesn't calm things down, we can hopefully get covered for the soolantra. Also I recommend using [facility] for best prices. Thank you!

## 2021-08-23 NOTE — Telephone Encounter (Signed)
Ivermectin cream denied by insurance. Pt must have tried and fail 3 of the following-   Metronidazole 0.75% cream Metronidazole 0.75% gel Metronidazole 0.75% lotion Mirvaso gel 0.33% Rosadan cream 0.75%

## 2021-08-24 MED ORDER — METRONIDAZOLE 0.75 % EX GEL: 1.0000 "application " | Freq: Two times a day (BID) | CUTANEOUS | 2 refills | Status: AC

## 2021-08-24 MED ORDER — MIRVASO 0.33 % EX GEL: 1.0000 "application " | Freq: Every morning | CUTANEOUS | 2 refills | Status: DC

## 2021-08-24 NOTE — Telephone Encounter (Signed)
Spoke with pt and informed her of rx not being covered by her insurance. Informed her of change and directions for new rxs.

## 2021-08-24 NOTE — Addendum Note (Signed)
Addended by: Harriett Sine on: 08/24/2021 11:59 AM   Modules accepted: Orders

## 2021-08-28 ENCOUNTER — Encounter: Payer: Self-pay | Admitting: Dermatology

## 2021-09-07 ENCOUNTER — Ambulatory Visit
Admission: RE | Admit: 2021-09-07 | Discharge: 2021-09-07 | Disposition: A | Discharge status: Home / Self Care | Payer: 59 | Source: Ambulatory Visit | Attending: Family Medicine | Admitting: Family Medicine

## 2021-09-07 ENCOUNTER — Other Ambulatory Visit: Payer: Self-pay | Admitting: Family Medicine

## 2021-09-07 ENCOUNTER — Other Ambulatory Visit: Payer: Self-pay

## 2021-09-07 DIAGNOSIS — Z1231 Encounter for screening mammogram for malignant neoplasm of breast: Secondary | ICD-10-CM | POA: Diagnosis not present

## 2021-09-07 IMAGING — MG MM DIGITAL SCREENING BILAT W/ TOMO AND CAD
6 of 10 series · 6 of 30 positions shown · non-contrast
Comparison: Previous exam(s).

CLINICAL DATA: Screening.

EXAM:
DIGITAL SCREENING BILATERAL MAMMOGRAM WITH TOMOSYNTHESIS AND CAD
TECHNIQUE: Bilateral screening digital craniocaudal and mediolateral oblique
mammograms were obtained. Bilateral screening digital breast
tomosynthesis was performed. The images were evaluated with
computer-aided detection.

[L MLO synth-2D]
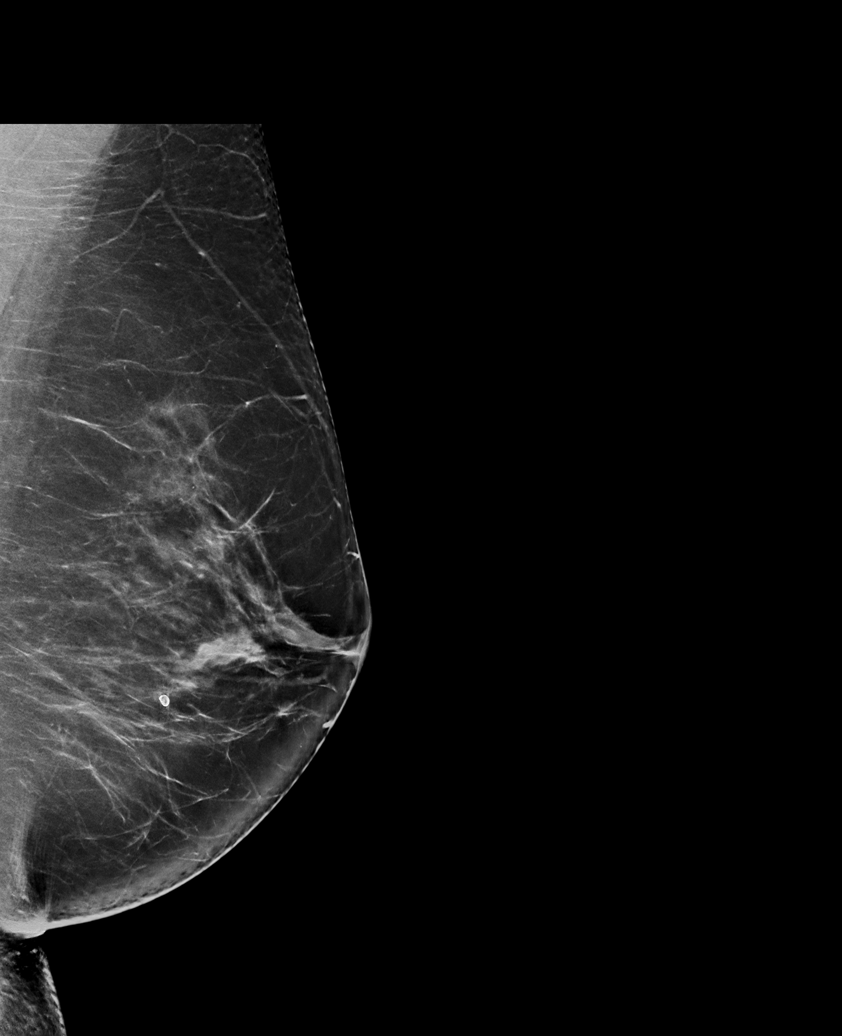

[R MLO synth-2D (1 of 2)]
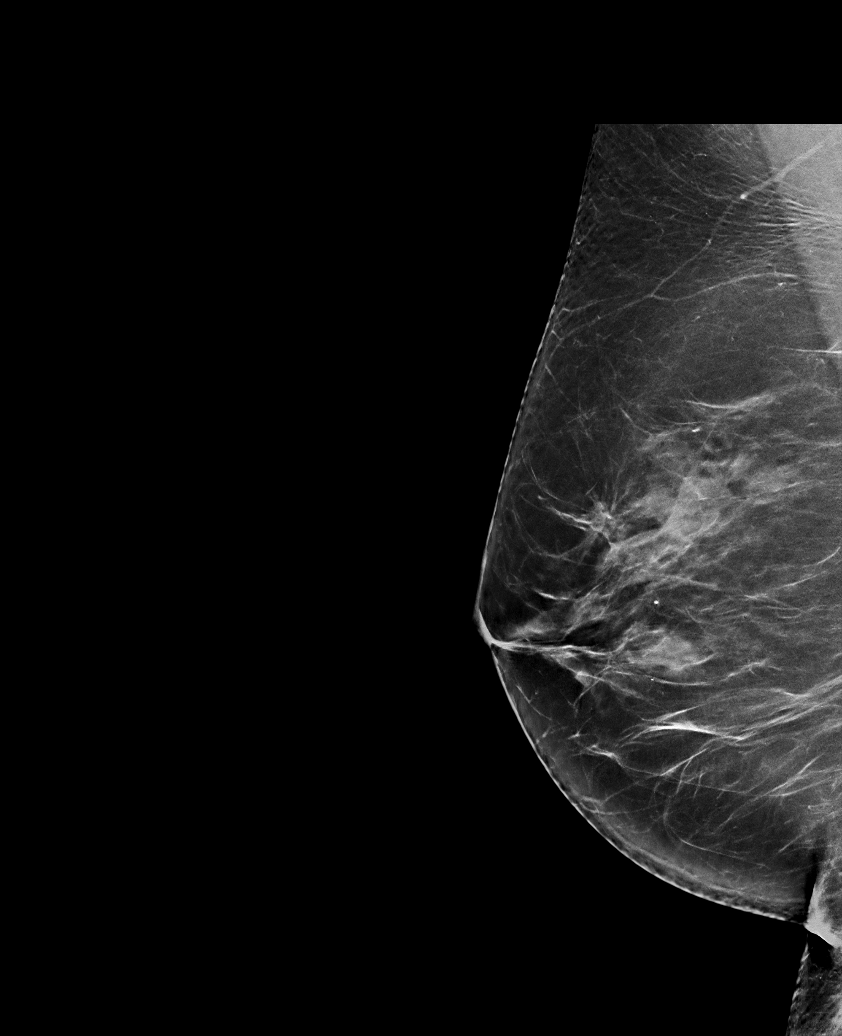

[L CC synth-2D]
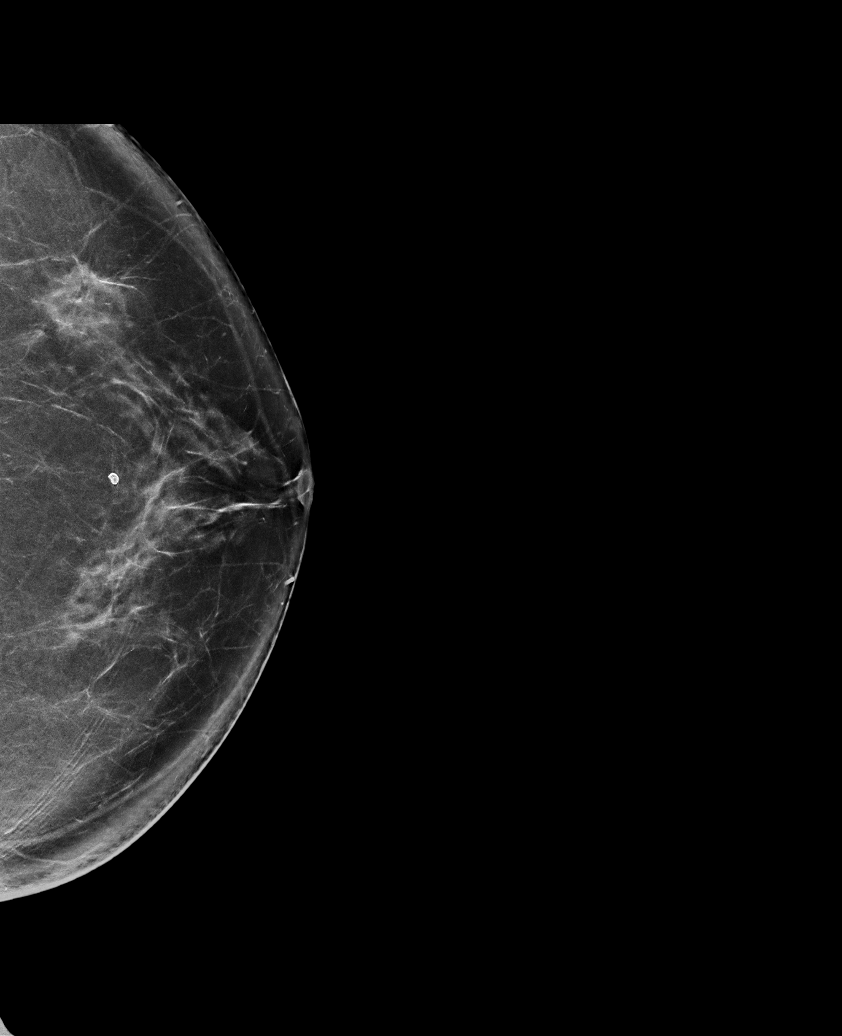

[R MLO synth-2D (2 of 2)]
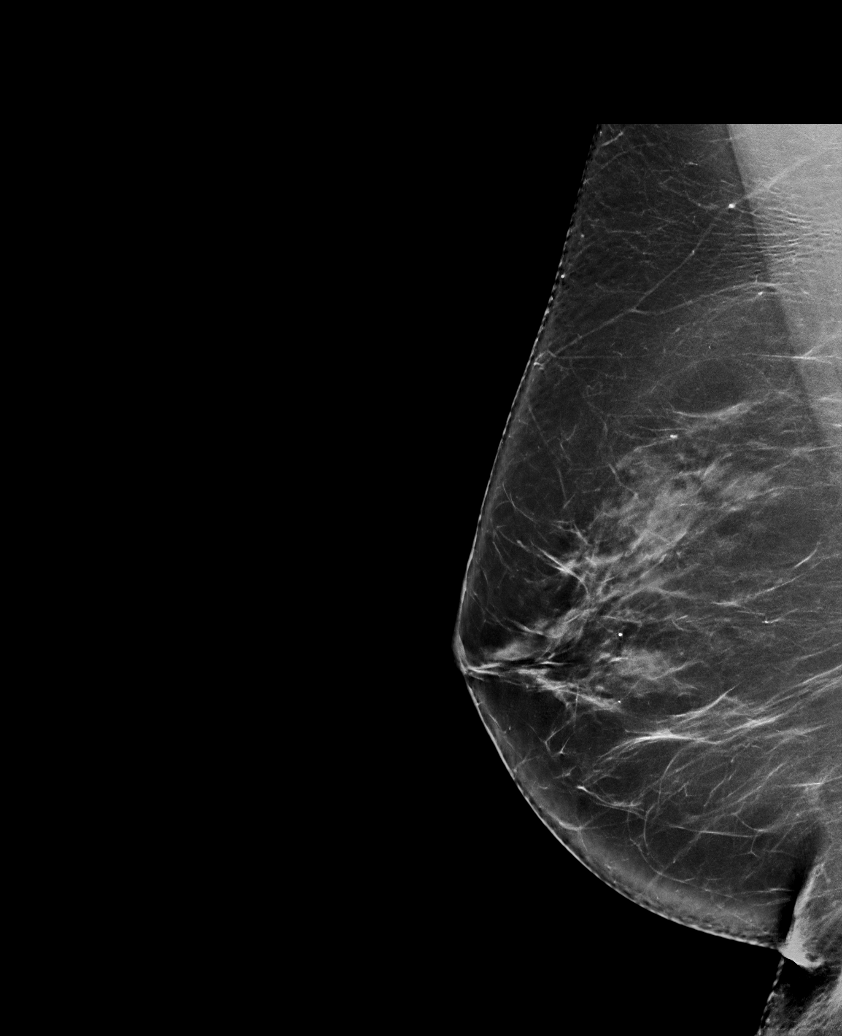

[R CC synth-2D]
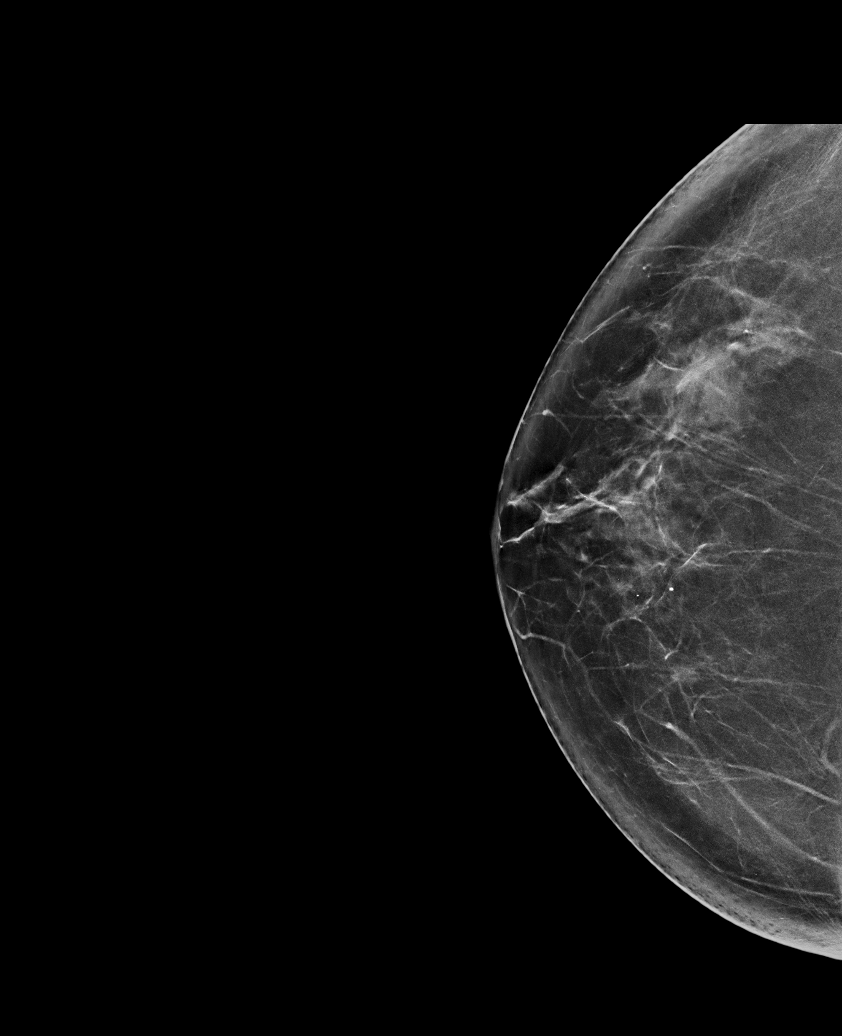

[R MLO tomo · tomo slice 43/84.0]
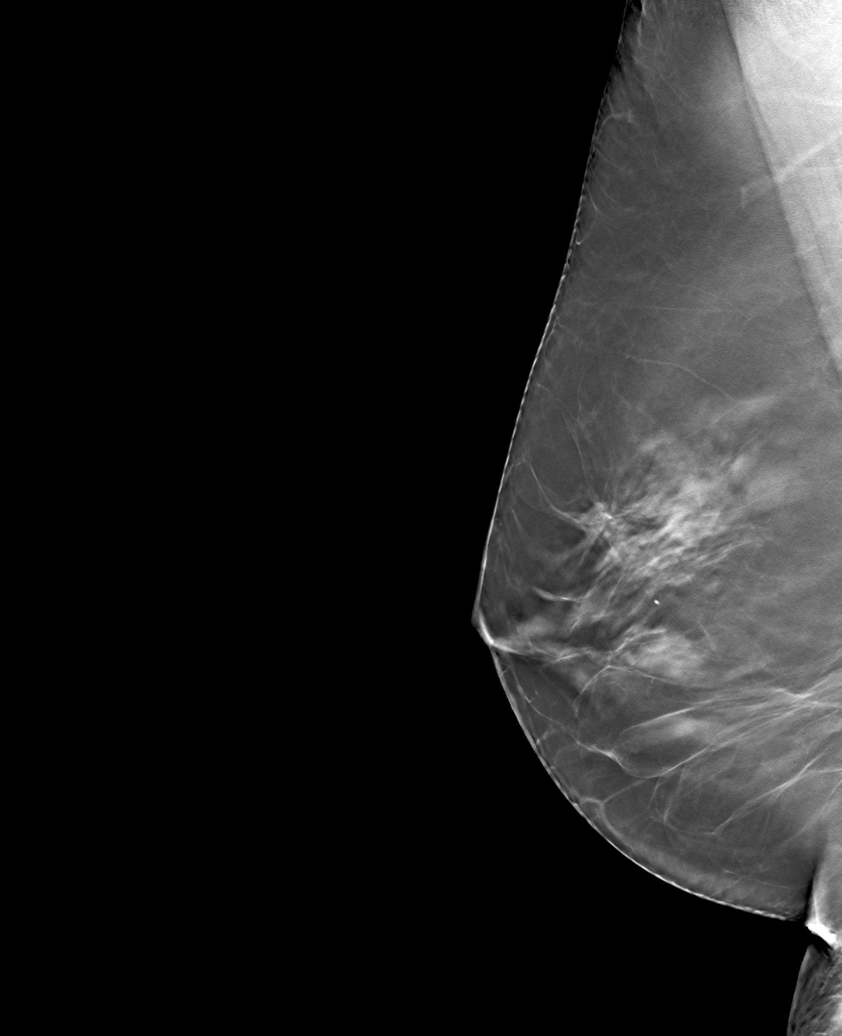

[6 of 30 positions shown; findings below may reference images not displayed]

ACR Breast Density Category b: There are scattered areas of
fibroglandular density.
FINDINGS: There are no findings suspicious for malignancy.
IMPRESSION: No mammographic evidence of malignancy. A result letter of this
screening mammogram will be mailed directly to the patient.

RECOMMENDATION:
Screening mammogram in one year. (Code:[BY])

BI-RADS CATEGORY  1: Negative.

## 2021-09-20 ENCOUNTER — Other Ambulatory Visit: Payer: Self-pay | Admitting: Physical Medicine & Rehabilitation

## 2021-09-20 DIAGNOSIS — M542 Cervicalgia: Secondary | ICD-10-CM

## 2021-09-28 ENCOUNTER — Ambulatory Visit
Admission: RE | Admit: 2021-09-28 | Discharge: 2021-09-28 | Disposition: A | Discharge status: Home / Self Care | Payer: 59 | Source: Ambulatory Visit | Attending: Physical Medicine & Rehabilitation | Admitting: Physical Medicine & Rehabilitation

## 2021-09-28 ENCOUNTER — Other Ambulatory Visit: Payer: Self-pay

## 2021-09-28 DIAGNOSIS — M542 Cervicalgia: Secondary | ICD-10-CM

## 2021-09-28 IMAGING — MR MR CERVICAL SPINE W/O CM
5 series · 38 of 48 positions shown · non-contrast
Comparison: Cervical spine radiographs [DATE]

CLINICAL DATA: Right-sided neck pain and headaches, right shoulder
and arm pain

EXAM:
MRI CERVICAL SPINE WITHOUT CONTRAST
TECHNIQUE: Multiplanar, multisequence MR imaging of the cervical spine was
performed. No intravenous contrast was administered.

[Series 5: T2 · sagittal · 3.0mm · 0.62mm/px · 8 of 15 slices shown (1 of 2)]
[im 1/15]
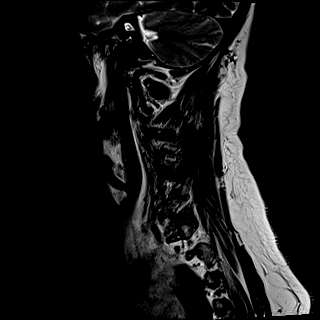
[im 3/15]
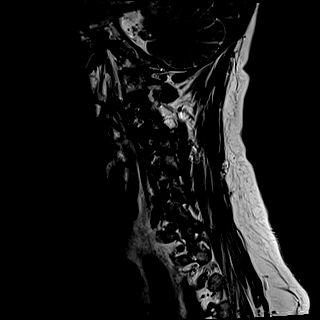
[im 5/15]
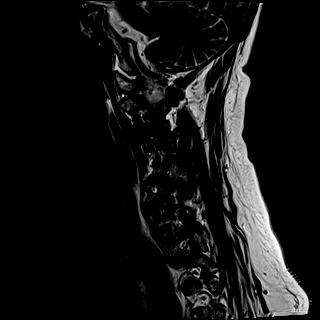
[im 7/15]
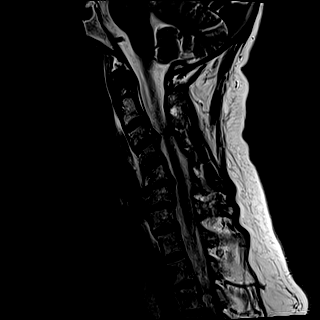
[im 9/15]
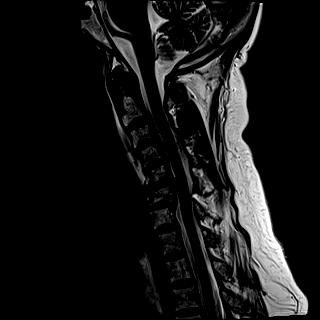
[im 11/15]
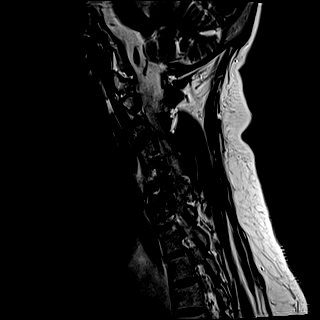
[im 13/15]
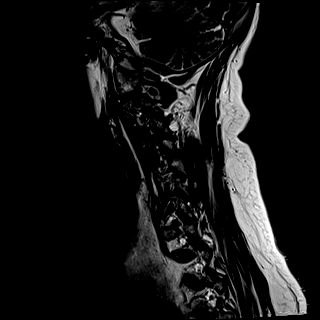
[im 15/15]
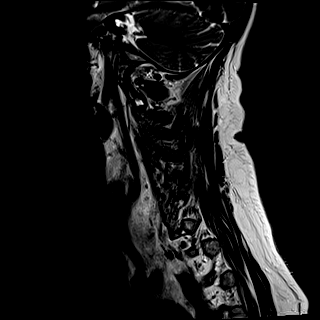

[Series 6: FLAIR · sagittal · 3.0mm · 0.78mm/px · 7 of 15 slices shown]
[im 1/15]
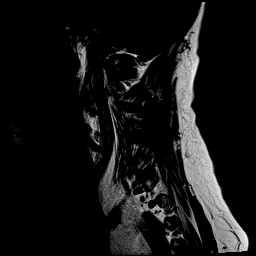
[im 3/15]
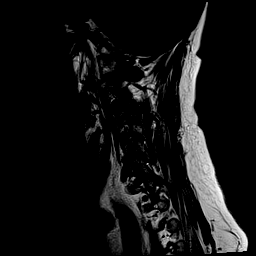
[im 5/15]
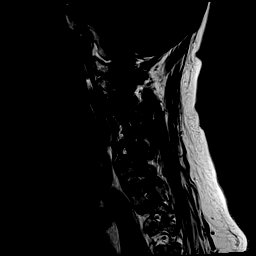
[im 8/15]
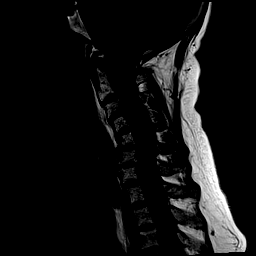
[im 10/15]
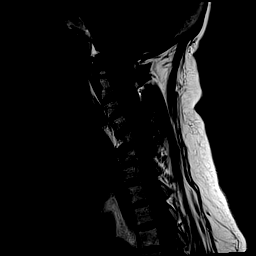
[im 12/15]
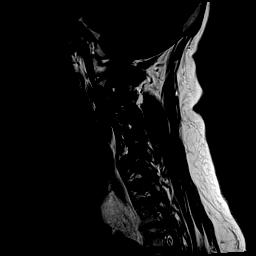
[im 15/15]
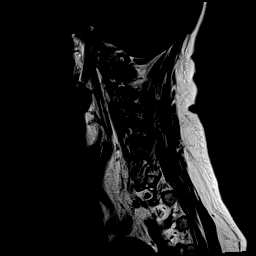

[Series 7: STIR · sagittal · 3.0mm · 0.62mm/px · 7 of 15 slices shown]
[im 1/15]
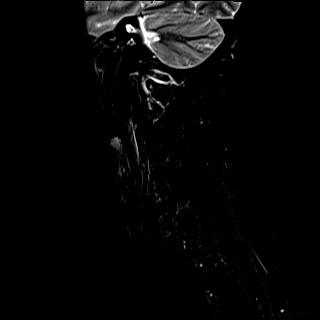
[im 3/15]
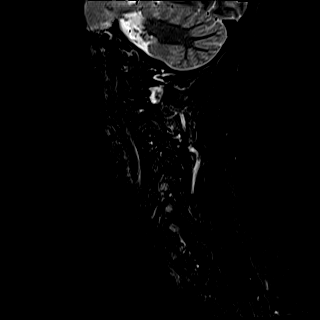
[im 5/15]
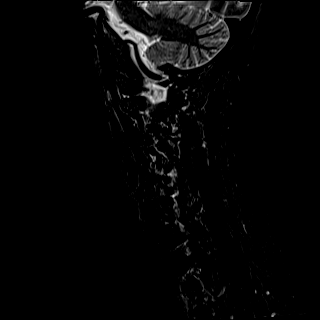
[im 8/15]
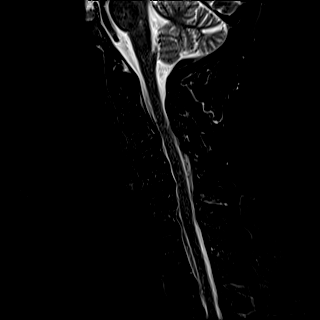
[im 10/15]
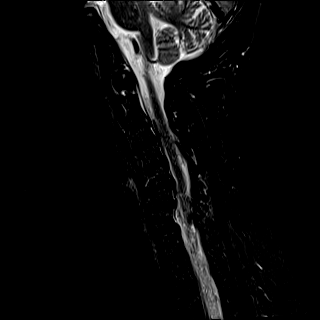
[im 12/15]
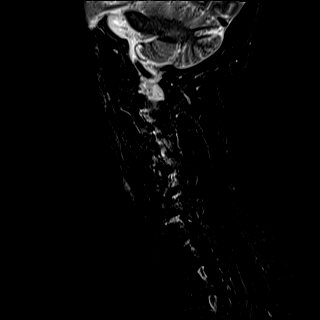
[im 15/15]
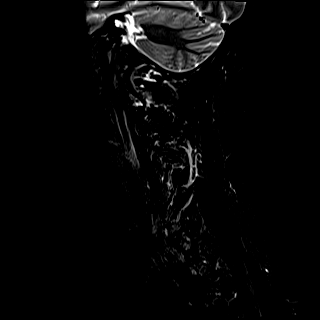

[Series 8: T2 · axial · 3.0mm · 0.70mm/px · z∈[-106,-20]mm · 9 of 26 slices shown (2 of 2)]
[im 1/26]
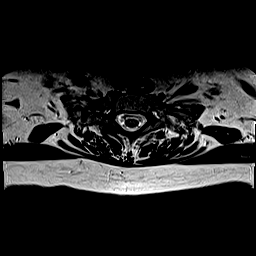
[im 5/26]
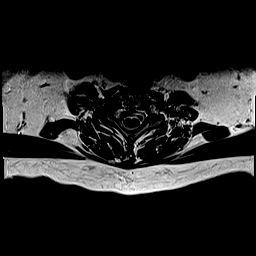
[im 9/26]
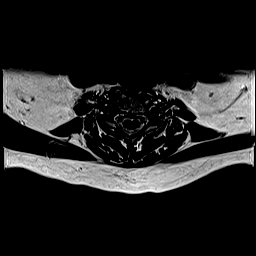
[im 11/26]
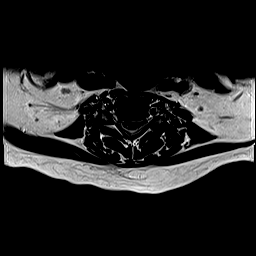
[im 13/26]
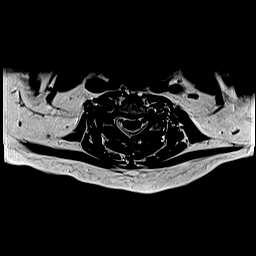
[im 15/26]
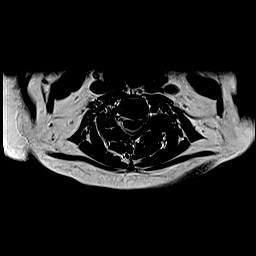
[im 17/26]
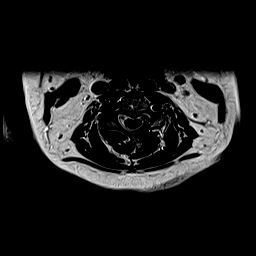
[im 21/26]
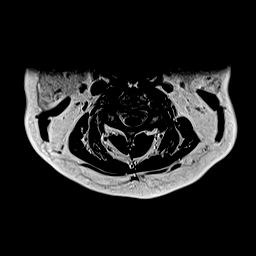
[im 26/26]
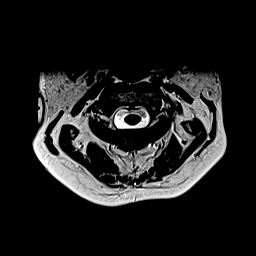

[Series 9: ax mpgr · axial · 3.0mm · 0.35mm/px · z∈[-106,-37]mm · 7 of 26 slices shown]
[im 1/26]
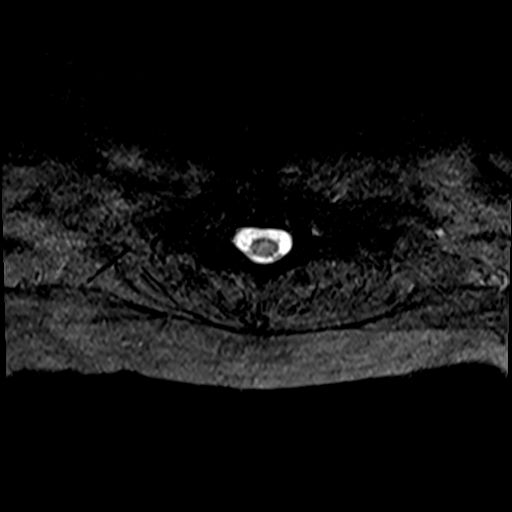
[im 5/26]
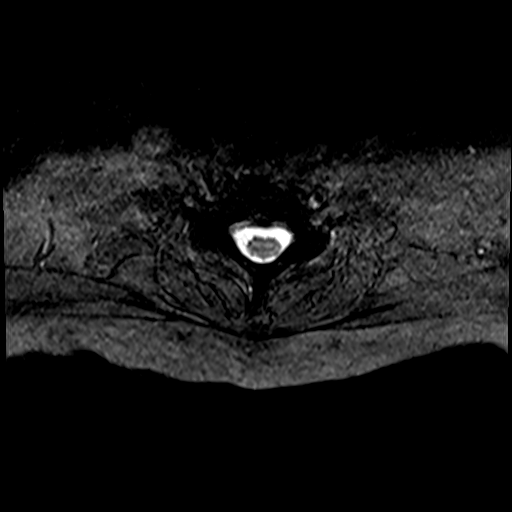
[im 9/26]
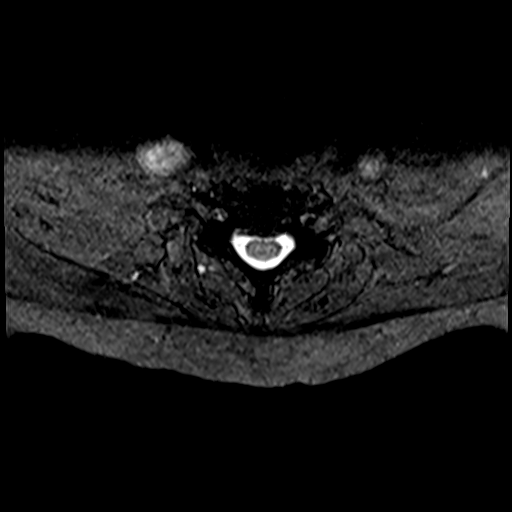
[im 11/26]
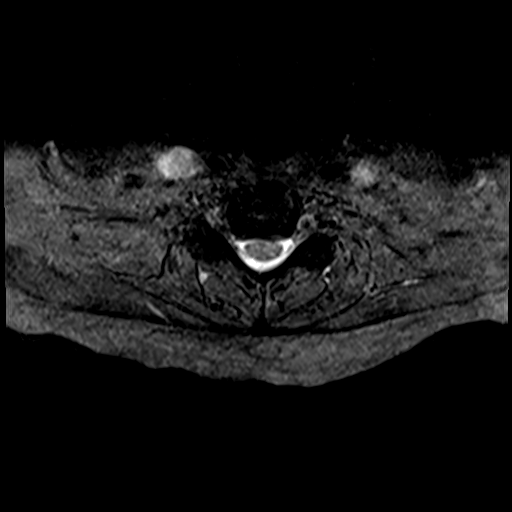
[im 15/26]
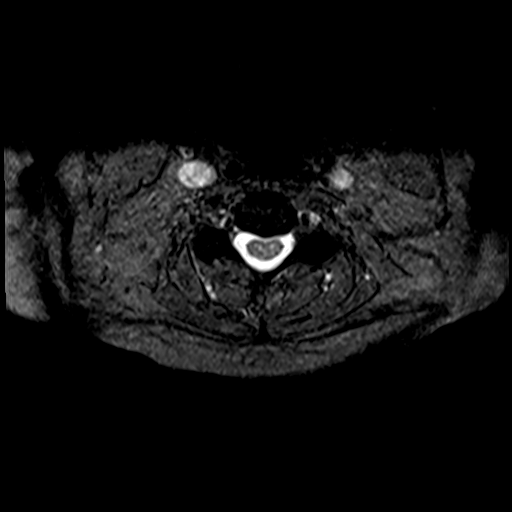
[im 17/26]
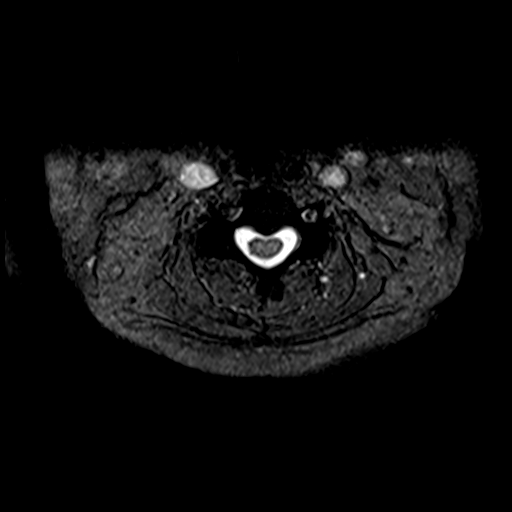
[im 21/26]
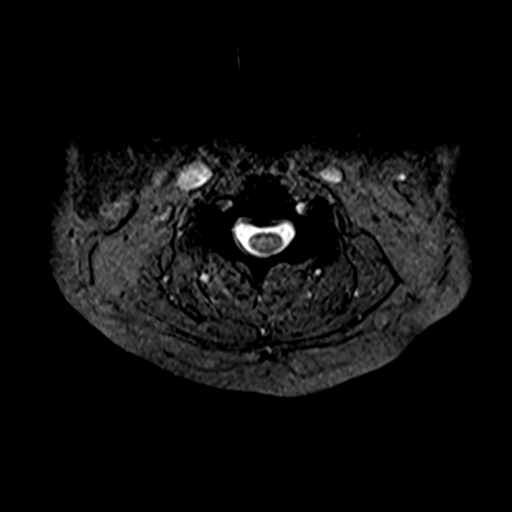

[38 of 48 positions shown; findings below may reference images not displayed]

FINDINGS: Alignment: There is reversal of the normal cervical spine lordosis
centered at C5-C6. There is trace retrolisthesis of C6 on C7.

Vertebrae: Vertebral body heights are preserved. Marrow signal is
normal.

Cord: Normal in signal and morphology.

Posterior Fossa, vertebral arteries, paraspinal tissues: The imaged
posterior fossa is unremarkable. The paraspinal soft tissues are
unremarkable. The vertebral artery flow voids are present.

Disc levels:

There is multilevel disc desiccation with moderate narrowing at
C5-C6 and C6-C7. There is facet arthropathy most advanced on the
right at C3-C4.

C2-C3: No significant spinal canal or neural foraminal stenosis.

C3-C4: There is a prominent posterior disc osteophyte complex
eccentric to the right extending into the right neural foramen and
uncovertebral and bilateral facet arthropathy resulting in severe
right and mild left neural foraminal stenosis and mild spinal canal
stenosis with slight effacement of the ventral thecal sac.

C4-C5: Mild uncovertebral and facet arthropathy without significant
spinal canal or neural foraminal stenosis.

C5-C6: There is a mild posterior disc osteophyte complex and
uncovertebral and facet arthropathy resulting in mild right and no
significant left neural foraminal stenosis without significant
spinal canal stenosis.

C6-C7: There is uncovertebral and bilateral facet arthropathy
resulting in moderate bilateral neural foraminal stenosis without
significant spinal canal stenosis.

C7-T1: There is a small right paracentral disc protrusion and mild
uncovertebral and facet arthropathy without significant spinal canal
or neural foraminal stenosis.

C7-T1: No significant spinal canal or neural foraminal stenosis.
IMPRESSION: 1. Degenerative changes at C3-C4 result in severe right neural
foraminal stenosis which may account for the patient's symptoms.
Mild spinal canal stenosis with partial effacement of the ventral
thecal sac at this level.
2. Moderate bilateral neural foraminal stenosis at C6-C7.
3. Reversal of the normal cervical spine lordosis centered at C5-C6
and trace retrolisthesis of C6 on C7, likely degenerative in nature.
4. Additional multilevel degenerative changes as above.

## 2022-01-03 ENCOUNTER — Encounter: Payer: 59 | Admitting: Dermatology

## 2022-01-09 ENCOUNTER — Encounter: Payer: Self-pay | Admitting: Family Medicine

## 2022-01-09 DIAGNOSIS — E785 Hyperlipidemia, unspecified: Secondary | ICD-10-CM

## 2022-01-09 MED ORDER — ATORVASTATIN CALCIUM 20 MG PO TABS: 20.0000 mg | ORAL_TABLET | Freq: Every day | ORAL | 1 refills | Status: DC

## 2022-01-09 NOTE — Addendum Note (Signed)
Addended by: Randal Buba on: 01/09/2022 03:26 PM   Modules accepted: Orders

## 2022-01-09 NOTE — Telephone Encounter (Signed)
Refill sent in to pharmacy 

## 2022-01-18 ENCOUNTER — Other Ambulatory Visit: Payer: Self-pay | Admitting: Rheumatology

## 2022-01-18 DIAGNOSIS — G35 Multiple sclerosis: Secondary | ICD-10-CM

## 2022-01-30 ENCOUNTER — Ambulatory Visit
Admission: RE | Admit: 2022-01-30 | Discharge: 2022-01-30 | Disposition: A | Discharge status: Home / Self Care | Payer: Medicare Other | Source: Ambulatory Visit | Attending: Rheumatology | Admitting: Rheumatology

## 2022-01-30 DIAGNOSIS — G35 Multiple sclerosis: Secondary | ICD-10-CM | POA: Diagnosis present

## 2022-01-30 IMAGING — MR MR HEAD WO/W CM
16 series · 48 of 48 positions shown · IV contrast (7.5ml Gadavist)
Comparison: Head CT [DATE]

CLINICAL DATA: History of untreated multiple sclerosis diagnosis in
[place]. Chart history of hyperlipidemia and hypertension.

EXAM:
MRI HEAD WITHOUT AND WITH CONTRAST
TECHNIQUE: Multiplanar, multiecho pulse sequences of the brain and surrounding
structures were obtained without and with intravenous contrast.
CONTRAST:  7.5mL GADAVIST GADOBUTROL 1 MMOL/ML IV SOLN

[Series 5: ax dwi_tracew · axial · 3.0mm · 0.65mm/px · z∈[-73,+82]mm · 4 of 48 slices shown]
[im 1/48]
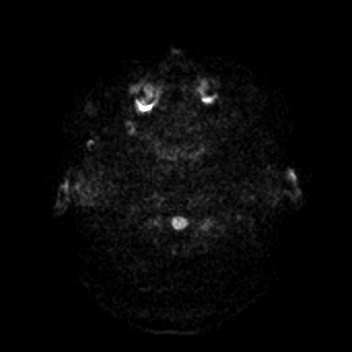
[im 16/48]
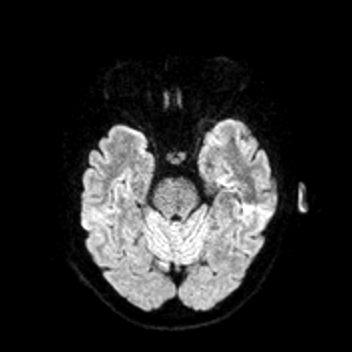
[im 32/48]
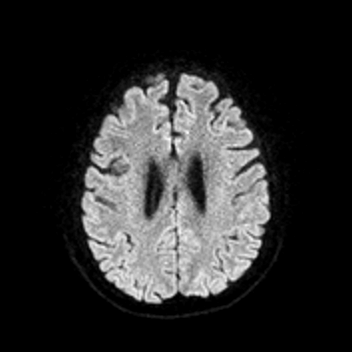
[im 48/48]
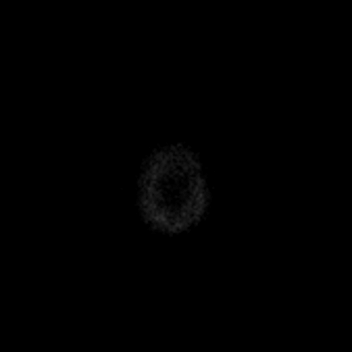

[Series 6: ax dwi_adc · axial · 3.0mm · 0.65mm/px · z∈[-73,+76]mm · 2 of 46 slices shown]
[im 1/46]
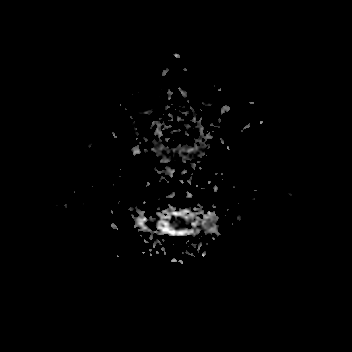
[im 46/46]
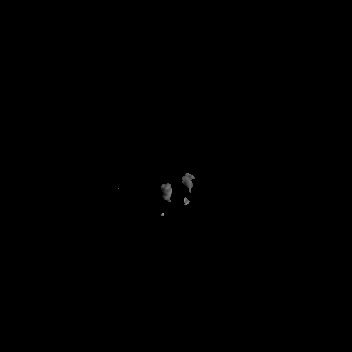

[Series 7: cor dwi_tracew · coronal · 5.0mm · 0.60mm/px · 2 of 38 slices shown]
[im 1/38]
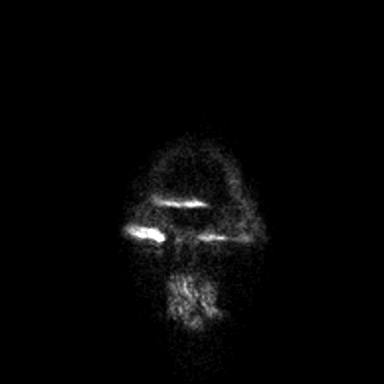
[im 38/38]
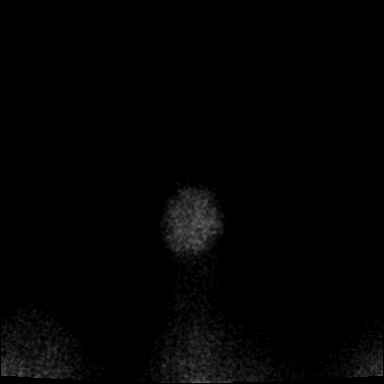

[Series 8: cor dwi_adc · coronal · 5.0mm · 0.60mm/px · 2 of 33 slices shown]
[im 1/33]
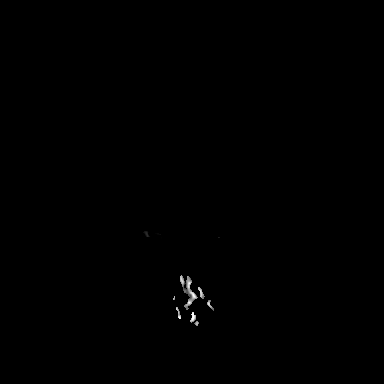
[im 33/33]
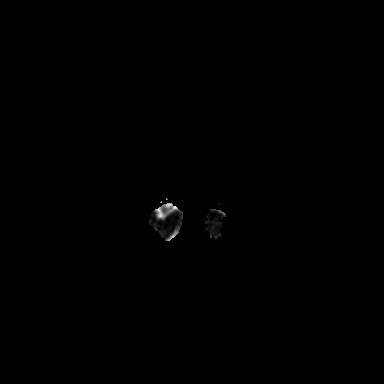

[Series 9: T1 · sagittal · 5.0mm · 0.62mm/px · 1 of 23 slices shown (1 of 2)]
[im 1/23]
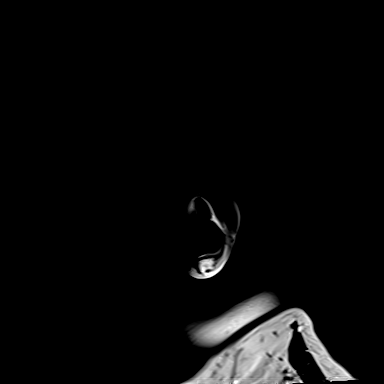

[Series 10: FLAIR · sagittal · 5.0mm · 0.94mm/px · 1 of 23 slices shown (1 of 2)]
[im 1/23]
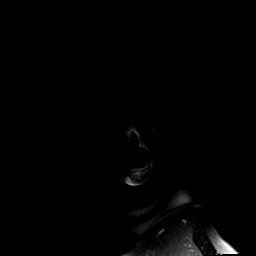

[Series 11: T2 · axial · 5.0mm · 0.53mm/px · 1 of 27 slices shown]
[im 1/27]
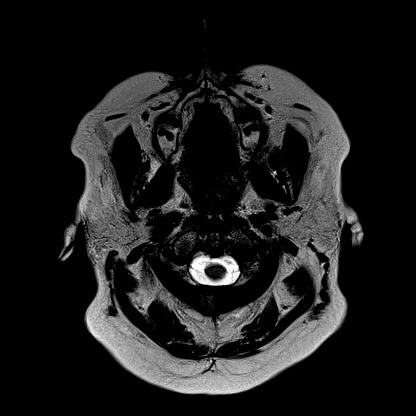

[Series 12: mag_images · axial · 3.0mm · 0.90mm/px · z∈[-79,+86]mm · 3 of 56 slices shown]
[im 1/56]
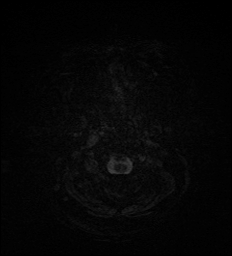
[im 28/56]
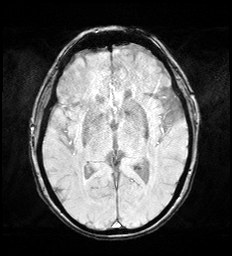
[im 56/56]
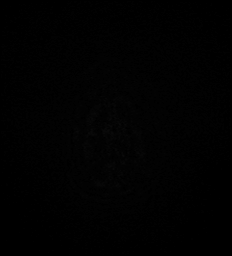

[Series 13: pha_images · axial · 3.0mm · 0.90mm/px · z∈[-79,+86]mm · 3 of 56 slices shown]
[im 1/56]
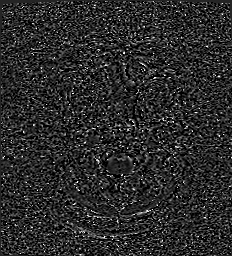
[im 28/56]
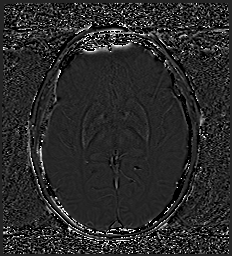
[im 56/56]
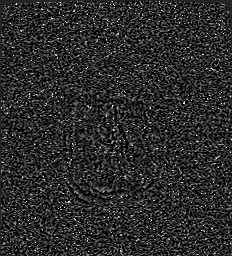

[Series 14: swi_images · axial · 3.0mm · 0.90mm/px · z∈[-79,+86]mm · 3 of 56 slices shown]
[im 1/56]
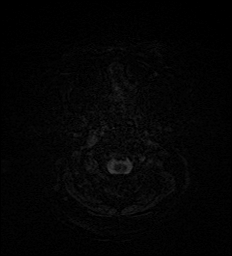
[im 28/56]
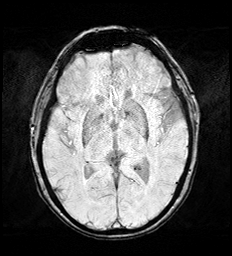
[im 56/56]
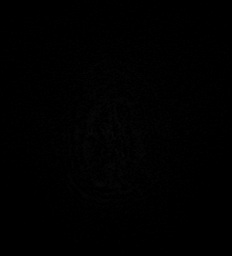

[Series 16: FLAIR · axial · 3.0mm · 0.53mm/px · z∈[-77,+85]mm · 3 of 55 slices shown (2 of 2)]
[im 1/55]
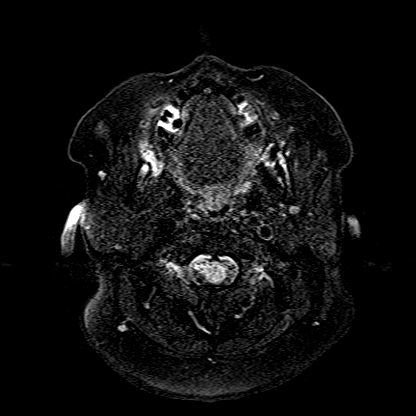
[im 28/55]
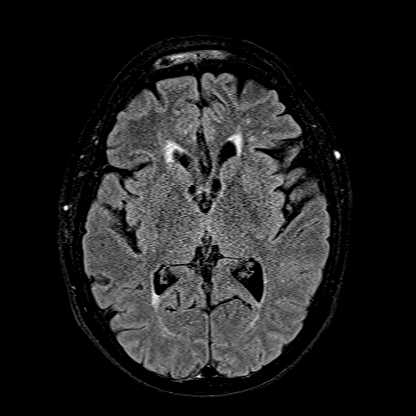
[im 55/55]
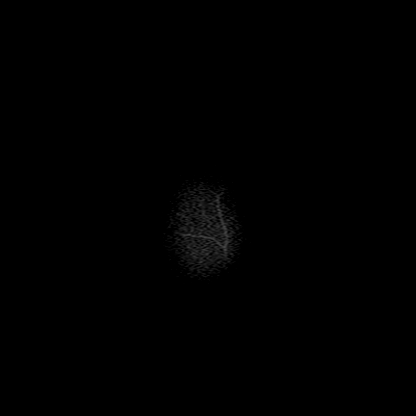

[Series 17: T1 · axial · 1.0mm · 0.98mm/px · z∈[-79,+93]mm · 9 of 173 slices shown (2 of 2)]
[im 1/173]
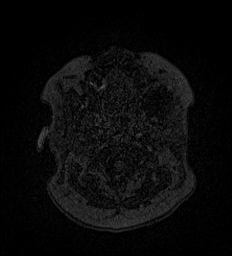
[im 22/173]
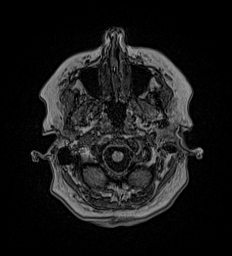
[im 44/173]
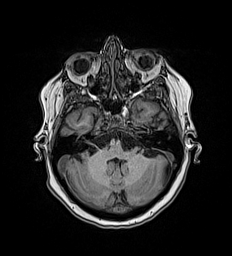
[im 65/173]
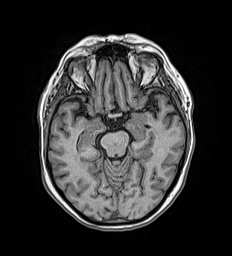
[im 87/173]
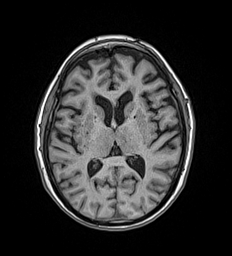
[im 108/173]
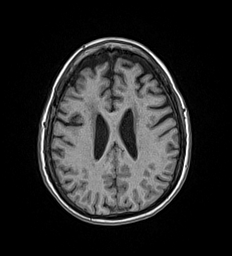
[im 130/173]
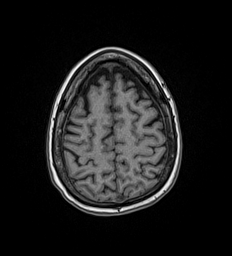
[im 151/173]
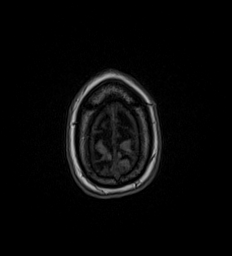
[im 173/173]
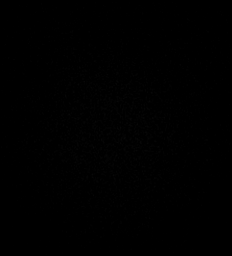

[Series 18: T2 post-contrast · coronal · 5.0mm · 0.57mm/px · 2 of 29 slices shown]
[im 1/29]
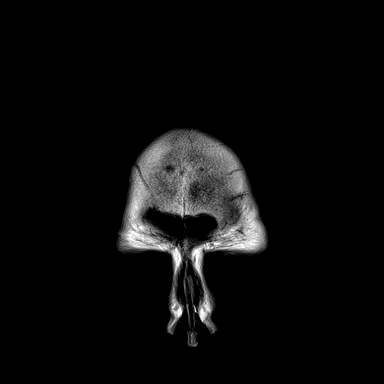
[im 29/29]
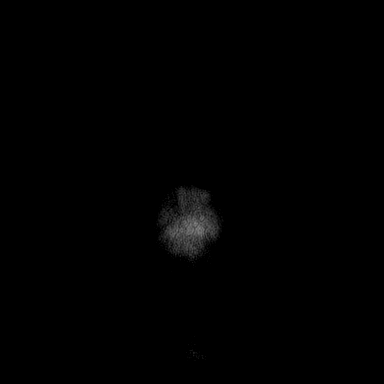

[Series 19: T1 post-contrast · axial · 1.0mm · 0.98mm/px · z∈[-79,+96]mm · 9 of 171 slices shown (1 of 3)]
[im 1/171]
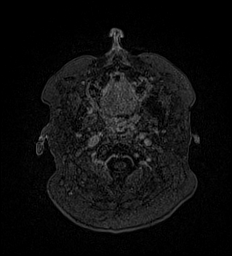
[im 22/171]
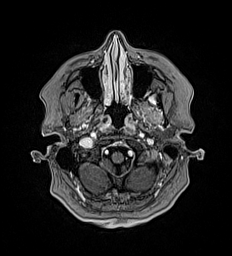
[im 43/171]
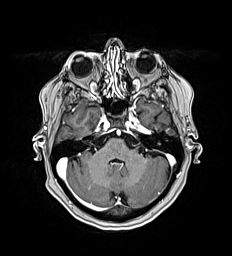
[im 64/171]
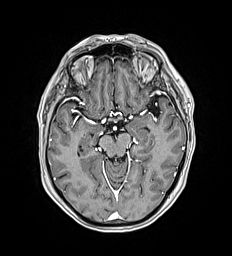
[im 86/171]
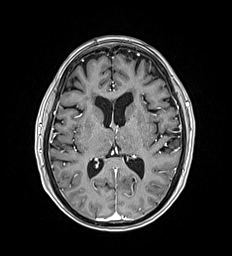
[im 107/171]
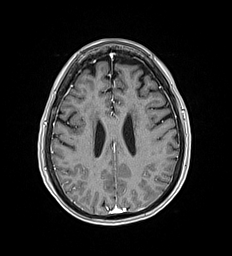
[im 128/171]
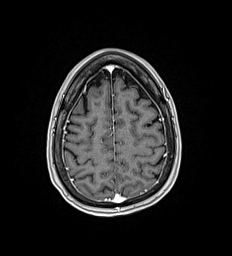
[im 149/171]
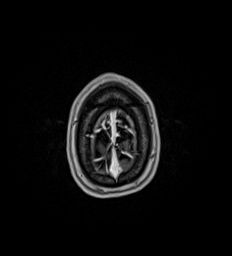
[im 171/171]
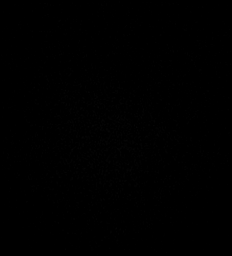

[Series 20: T1 post-contrast · coronal · 5.0mm · 0.57mm/px · 2 of 29 slices shown (2 of 3)]
[im 1/29]
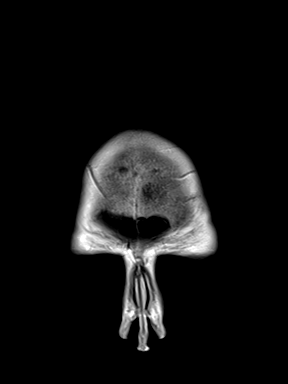
[im 29/29]
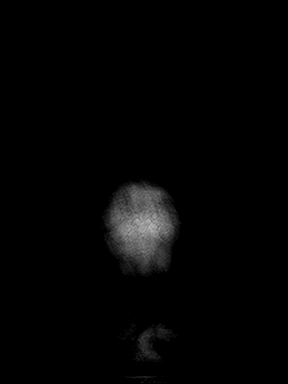

[Series 21: T1 post-contrast · sagittal · 5.0mm · 0.62mm/px · 1 of 23 slices shown (3 of 3)]
[im 1/23]
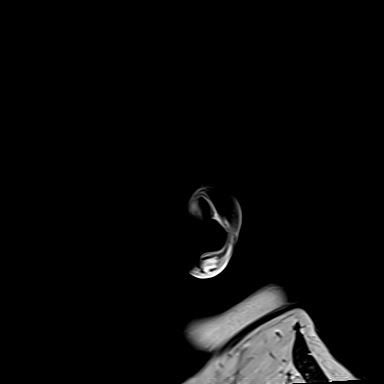

[48 of 48 positions shown; findings below may reference images not displayed]

FINDINGS: Brain: No acute or subacute infarction, hemorrhage, hydrocephalus,
extra-axial collection or mass lesion. Moderate FLAIR hyperintensity
in the cerebral white matter, confluent around the lateral
ventricles. No specific ovoid radiating periventricular pattern. No
infratentorial or juxtacortical involvement.

Vascular: Normal flow voids and vascular enhancements

Skull and upper cervical spine: Prior bilateral posterior mandible
surgery. Cervical spine degeneration which is diffuse.

Sinuses/Orbits: Negative
IMPRESSION: Chronic white matter disease with nonspecific pattern usually
related to chronic small vessel ischemia with this patient age and
risk factors. No abnormal enhancement. No reversible finding.

## 2022-01-30 MED ORDER — GADOBUTROL 1 MMOL/ML IV SOLN
7.5000 mL | Freq: Once | INTRAVENOUS | Status: AC | PRN
  Administered 2022-01-30: 7.5 mL via INTRAVENOUS

## 2022-02-27 ENCOUNTER — Encounter: Payer: Self-pay | Admitting: Family Medicine

## 2022-02-28 ENCOUNTER — Other Ambulatory Visit: Payer: Self-pay

## 2022-02-28 MED ORDER — LISINOPRIL 40 MG PO TABS: 40.0000 mg | ORAL_TABLET | Freq: Every day | ORAL | 0 refills | Status: DC

## 2022-02-28 MED ORDER — HYDROCHLOROTHIAZIDE 12.5 MG PO CAPS
12.5000 mg | ORAL_CAPSULE | Freq: Every day | ORAL | 0 refills | Status: DC
Start: 2022-02-28 — End: 2022-03-13

## 2022-03-01 ENCOUNTER — Other Ambulatory Visit: Payer: Self-pay | Admitting: Surgery

## 2022-03-06 ENCOUNTER — Other Ambulatory Visit: Payer: Self-pay

## 2022-03-06 ENCOUNTER — Encounter
Admission: RE | Admit: 2022-03-06 | Discharge: 2022-03-06 | Disposition: A | Discharge status: Home / Self Care | Payer: Medicare Other | Source: Ambulatory Visit | Attending: Surgery | Admitting: Surgery

## 2022-03-06 VITALS — BP 123/92 | HR 90 | Resp 18 | Ht 61.0 in | Wt 191.6 lb

## 2022-03-06 DIAGNOSIS — Z01818 Encounter for other preprocedural examination: Secondary | ICD-10-CM | POA: Diagnosis present

## 2022-03-06 DIAGNOSIS — B9620 Unspecified Escherichia coli [E. coli] as the cause of diseases classified elsewhere: Secondary | ICD-10-CM | POA: Insufficient documentation

## 2022-03-06 DIAGNOSIS — N390 Urinary tract infection, site not specified: Secondary | ICD-10-CM | POA: Diagnosis not present

## 2022-03-06 DIAGNOSIS — Z01812 Encounter for preprocedural laboratory examination: Secondary | ICD-10-CM

## 2022-03-06 HISTORY — DX: Headache, unspecified: R51.9

## 2022-03-06 HISTORY — DX: Anemia, unspecified: D64.9

## 2022-03-06 LAB — CBC WITH DIFFERENTIAL/PLATELET
Abs Immature Granulocytes: 0.07 10*3/uL (ref 0.00–0.07)
Basophils Absolute: 0.1 10*3/uL (ref 0.0–0.1)
Basophils Relative: 1 %
Eosinophils Absolute: 0.2 10*3/uL (ref 0.0–0.5)
Eosinophils Relative: 2 %
HCT: 42.1 % (ref 36.0–46.0)
Hemoglobin: 13.6 g/dL (ref 12.0–15.0)
Immature Granulocytes: 1 %
Lymphocytes Relative: 24 %
Lymphs Abs: 2.5 10*3/uL (ref 0.7–4.0)
MCH: 31.7 pg (ref 26.0–34.0)
MCHC: 32.3 g/dL (ref 30.0–36.0)
MCV: 98.1 fL (ref 80.0–100.0)
Monocytes Absolute: 1.1 10*3/uL — ABNORMAL HIGH (ref 0.1–1.0)
Monocytes Relative: 10 %
Neutro Abs: 6.3 10*3/uL (ref 1.7–7.7)
Neutrophils Relative %: 62 %
Platelets: 422 10*3/uL — ABNORMAL HIGH (ref 150–400)
RBC: 4.29 MIL/uL (ref 3.87–5.11)
RDW: 13.6 % (ref 11.5–15.5)
WBC: 10.1 10*3/uL (ref 4.0–10.5)
nRBC: 0 % (ref 0.0–0.2)

## 2022-03-06 LAB — SURGICAL PCR SCREEN
MRSA, PCR: NEGATIVE
Staphylococcus aureus: NEGATIVE

## 2022-03-06 LAB — COMPREHENSIVE METABOLIC PANEL
ALT: 25 U/L (ref 0–44)
AST: 27 U/L (ref 15–41)
Albumin: 3.7 g/dL (ref 3.5–5.0)
Alkaline Phosphatase: 70 U/L (ref 38–126)
Anion gap: 8 (ref 5–15)
BUN: 23 mg/dL (ref 8–23)
CO2: 28 mmol/L (ref 22–32)
Calcium: 9.4 mg/dL (ref 8.9–10.3)
Chloride: 101 mmol/L (ref 98–111)
Creatinine, Ser: 0.62 mg/dL (ref 0.44–1.00)
GFR, Estimated: >60 mL/min (ref >60)
Glucose, Bld: 122 mg/dL — ABNORMAL HIGH (ref 70–99)
Potassium: 3.5 mmol/L (ref 3.5–5.1)
Sodium: 137 mmol/L (ref 135–145)
Total Bilirubin: 0.4 mg/dL (ref 0.3–1.2)
Total Protein: 7.1 g/dL (ref 6.5–8.1)

## 2022-03-06 LAB — URINALYSIS, ROUTINE W REFLEX MICROSCOPIC
Bilirubin Urine: NEGATIVE
Glucose, UA: NEGATIVE mg/dL
Hgb urine dipstick: NEGATIVE
Ketones, ur: NEGATIVE mg/dL
Nitrite: POSITIVE — AB
Protein, ur: NEGATIVE mg/dL
Specific Gravity, Urine: 1.024 (ref 1.005–1.030)
Squamous Epithelial / HPF: >50 — ABNORMAL HIGH (ref 0–5)
pH: 5 (ref 5.0–8.0)

## 2022-03-06 LAB — TYPE AND SCREEN
ABO/RH(D): A POS
Antibody Screen: NEGATIVE

## 2022-03-06 NOTE — Patient Instructions (Addendum)
Your procedure is scheduled on: 03/20/22 - Tuesday ?Report to the Registration Desk on the 1st floor of the [facility]. ?To find out your arrival time, please call [phone] between 1PM - 3PM on: 03/19/22 - Monday ? ?REMEMBER: ?Instructions that are not followed completely may result in serious medical risk, up to and including death; or upon the discretion of your surgeon and anesthesiologist your surgery may need to be rescheduled. ? ?Do not eat food after midnight the night before surgery.  ?No gum chewing, lozengers or hard candies. ? ?You may however, drink CLEAR liquids up to 2 hours before you are scheduled to arrive for your surgery. Do not drink anything within 2 hours of your scheduled arrival time. ? ?Clear liquids include: ?- water  ?- apple juice without pulp ?- gatorade (not RED colors) ?- black coffee or tea (Do NOT add milk or creamers to the coffee or tea) ?Do NOT drink anything that is not on this list. ? ?In addition, your doctor has ordered for you to drink the provided  ?Ensure Pre-Surgery Clear Carbohydrate Drink  ?Drinking this carbohydrate drink up to two hours before surgery helps to reduce insulin resistance and improve patient outcomes. Please complete drinking 2 hours prior to scheduled arrival time. ? ?TAKE THESE MEDICATIONS THE MORNING OF SURGERY WITH A SIP OF WATER: ? ?- atorvastatin (LIPITOR) 20 MG tablet ?- FLUoxetine (PROZAC) 40 MG capsule ?- Pepcid AC 20 mg - take 1 tablet the night before surgery and 1 tablet the morning of. ? ? ?One week prior to surgery: Stop taking Aspirin 81 mg 5 days prior to your surgery. ?Stop Anti-inflammatories (NSAIDS) such as Advil, Aleve, Ibuprofen, Motrin, Naproxen, Naprosyn and Aspirin based products such as Excedrin, Goodys Powder, BC Powder. ?Stop methotrexate 2.5 MG tablet beginning 03/06/22, may resume with MD order. ? ?Stop ANY OVER THE COUNTER supplements until after surgery. ? ?You may however, continue to take Tylenol if needed for  pain up until the day of surgery. ? ?No Alcohol for 24 hours before or after surgery. ? ?No Smoking including e-cigarettes for 24 hours prior to surgery.  ?No chewable tobacco products for at least 6 hours prior to surgery.  ?No nicotine patches on the day of surgery. ? ?Do not use any "recreational" drugs for at least a week prior to your surgery.  ?Please be advised that the combination of cocaine and anesthesia may have negative outcomes, up to and including death. ?If you test positive for cocaine, your surgery will be cancelled. ? ?On the morning of surgery brush your teeth with toothpaste and water, you may rinse your mouth with mouthwash if you wish. ?Do not swallow any toothpaste or mouthwash. ? ?Use CHG Soap or wipes as directed on instruction sheet. ? ?Do not wear jewelry, make-up, hairpins, clips or nail polish. ? ?Do not wear lotions, powders, or perfumes.  ? ?Do not shave body from the neck down 48 hours prior to surgery just in case you cut yourself which could leave a site for infection.  ?Also, freshly shaved skin may become irritated if using the CHG soap. ? ?Contact lenses, hearing aids and dentures may not be worn into surgery. ? ?Do not bring valuables to the hospital. [facility] is not responsible for any missing/lost belongings or valuables.  ? ?Notify your doctor if there is any change in your medical condition (cold, fever, infection). ? ?Wear comfortable clothing (specific to your surgery type) to the hospital. ? ?After surgery, you can  help prevent lung complications by doing breathing exercises.  ?Take deep breaths and cough every 1-2 hours. Your doctor may order a device called an Incentive Spirometer to help you take deep breaths. ?When coughing or sneezing, hold a pillow firmly against your incision with both hands. This is called ?splinting.? Doing this helps protect your incision. It also decreases belly discomfort. ? ?If you are being admitted to the hospital overnight, leave your  suitcase in the car. ?After surgery it may be brought to your room. ? ?If you are being discharged the day of surgery, you will not be allowed to drive home. ?You will need a responsible adult (18 years or older) to drive you home and stay with you that night.  ? ?If you are taking public transportation, you will need to have a responsible adult (18 years or older) with you. ?Please confirm with your physician that it is acceptable to use public transportation.  ? ?Please call the [facility] at [phone] if you have any questions about these instructions. ? ?Surgery Visitation Policy: ? ?Patients undergoing a surgery or procedure may have two family members or support persons with them as long as the person is not COVID-19 positive or experiencing its symptoms.  ? ?Inpatient Visitation:   ? ?Visiting hours are 7 a.m. to 8 p.m. ?Up to four visitors are allowed at one time in a patient room, including children. The visitors may rotate out with other people during the day. One designated support person (adult) may remain overnight.  ?

## 2022-03-09 LAB — URINE CULTURE: Culture: >=100000 — AB

## 2022-03-09 NOTE — Progress Notes (Signed)
?  Perioperative Services ?Pre-Admission/Anesthesia Testing ? ?  ? ?Date: 03/09/22 ? ?Name: Stephanie Shah ?MRN:   [id number] ? ?Re: Change in ABX for upcoming surgery ? ? Case: [id number] Date/Time: 03/20/22 0715  ? Procedure: TOTAL KNEE ARTHROPLASTY (Left: Knee)  ? Anesthesia type: Choice  ? Pre-op diagnosis: PRIMARY OSTEOARTHRITIS OF LEFT KNEE.  ? Location: [facility] OR ROOM 03 / [facility] ORS FOR ANESTHESIA GROUP  ? Surgeons: Corky Mull, MD  ? ?Primary attending surgeon was consulted regarding consideration of therapeutic change in antimicrobial agent being used for preoperative prophylaxis in this patient's upcoming surgical case. Following analysis of the risk versus benefits, the patient's primary attending surgeon advised that it would be acceptable to discontinue the ordered vancomycin and place an order for cefazolin 2 gm IV on call to the OR. Orders for this patient were amended by me following collaborative conversation with attending surgeon taking into consideration of risk versus benefits associated with the change in therapy. ? ?Honor Loh, MSN, APRN, FNP-C, CEN ?[name]  ?Peri-operative Services Nurse Practitioner ?Phone: [phone] ?03/09/22 2:07 PM ? ?

## 2022-03-09 NOTE — Progress Notes (Signed)
?  [facility] ?Perioperative Services: Pre-Admission/Anesthesia Testing ? ?Abnormal Lab Notification and Treatment Plan of Care ?  ?Date: 03/09/22 ? ?Name: Stephanie Shah ?MRN:   [id number] ? ?Re: Abnormal labs noted during PAT appointment  ? ?Notified:  ?Provider Name Provider Role Notification Mode  ?Milagros Evener, MD Orthopedics (Surgeon) Routed and/or faxed via [facility]  ?Cameron Proud, PA-C Orthopedics (APP) Routed and/or faxed via [place]  ? ?Abnormal Lab Value(s):  ? ?Lab Results  ?Component Value Date  ? COLORURINE YELLOW (A) 03/06/2022  ? APPEARANCEUR CLOUDY (A) 03/06/2022  ? LABSPEC 1.024 03/06/2022  ? PHURINE 5.0 03/06/2022  ? GLUCOSEU NEGATIVE 03/06/2022  ? [place] NEGATIVE 03/06/2022  ? [place] NEGATIVE 03/06/2022  ? [place] NEGATIVE 03/06/2022  ? PROTEINUR NEGATIVE 03/06/2022  ? NITRITE POSITIVE (A) 03/06/2022  ? LEUKOCYTESUR SMALL (A) 03/06/2022  ? EPIU >50 (H) 03/06/2022  ? WBCU 21-50 03/06/2022  ? BACTERIA MANY (A) 03/06/2022  ? CULT >=100,000 COLONIES/mL ESCHERICHIA COLI (A) 03/06/2022  ? ? ?Clinical Information and Notes:  ?Patient is scheduled for TOTAL KNEE ARTHROPLASTY (Left: Knee) on 03/20/2022.   ? ?UA performed in PAT consistent with/concerning for infection.  ?No leukocytosis noted on CBC; WBC 10.1 K/uL ?Renal function: Estimated Creatinine Clearance: 70.2 mL/min (by C-G formula based on SCr of 0.62 mg/dL). ?Urine C&S added to assess for pathogenically significant growth. ? ?Impression and Plan:  ?Natarsha Hurwitz with a UA that was (+) for infection; reflex culture sent.  Culture resulted with significant GNR colony count; pathogen identified as Escherichia coli.  Culture only resulted today (03/09/2022).  Of note, patient seen and preoperative consult by orthopedics yesterday and mention the fact that she appreciated that she had a UTI on her preoperative lab work, however she was asymptomatic at that time.  UA repeated by orthopedics; remained nitrite positive with moderate  bacteria.  Patient was not prescribed antibiotics pending review of repeat UA.  Given that orthopedics is aware of the aforementioned results, will defer treatment of potential UTI to orthopedic service.  Copy of this note being sent to primary attending surgeon and his APP for review. ? ?Honor Loh, MSN, APRN, FNP-C, CEN ?[place]  ?Peri-operative Services Nurse Practitioner ?Phone: [phone] ?Fax: [phone] ?03/09/22 8:48 AM ? ?NOTE: This note has been prepared using [employer]. Despite my best ability to proofread, there is always the potential that unintentional transcriptional errors may still occur from this process. ? ?

## 2022-03-09 NOTE — Progress Notes (Signed)
?Perioperative Services ?Pre-Admission/Anesthesia Testing ?  ?Date: 03/09/22 ?Name: Stephanie Shah ?MRN:   [id number] ? ?Re: Consideration of preoperative prophylactic antibiotic change  ? ?Request sent to: Poggi, Marshall Cork, MD (routed and/or faxed via [facility]) ? ?Planned Surgical Procedure(s):  ? ? Case: [id number] Date/Time: 03/20/22 0715  ? Procedure: TOTAL KNEE ARTHROPLASTY (Left: Knee)  ? Anesthesia type: Choice  ? Pre-op diagnosis: PRIMARY OSTEOARTHRITIS OF LEFT KNEE.  ? Location: [facility] OR ROOM 03 / [facility] ORS FOR ANESTHESIA GROUP  ? Surgeons: Corky Mull, MD  ? ?Clinical Notes:  ?Patient has a documented allergy/intolerance to PCN  ?Advising that PCN has caused her to experience facial swelling and erythema in the past. ? ?Screened as appropriate for cephalosporin use during medication reconciliation ?No immediate angioedema, dysphagia, SOB, anaphylaxis symptoms. ?No severe rash involving mucous membranes or skin necrosis. ?No recent hospital admissions related to side effects of PCN/cephalosporin use.  ?No documented reaction to PCN or cephalosporin in the last 10 years. ? ?Request:  ?As an evidence based approach to reducing the rate of incidence for post-operative SSI and the development of MDROs, could an agent that allows for narrower antimicrobial coverage for preoperative prophylaxis in this patient's upcoming surgical course be considered?  ? ?Currently ordered preoperative prophylactic ABX: vancomycin.  ? ?Specifically requesting change to cephalosporin (CEFAZOLIN).  ?Drug of choice for many procedures; it is the most widely studied antimicrobial agent with proven efficacy for antimicrobial prophylaxis.  ? ?Desirable duration of action, spectrum of activity against organisms commonly encountered in surgery, and it has an excellent safety profile and low cost.  ? ?Active against streptococci, methicillin-susceptible staphylococci, and many gram-negative organisms. ? ?Please communicate decision with me and I will  change the orders in Epic as per your direction.  ? ?Things to consider: ?Many patients report that they were "allergic" to PCN earlier in life, however this does not translate into a true lifelong allergy. Patients can lose sensitivity to specific IgE antibodies over time if PCN is avoided (Kleris & Lugar, 2019).  ?Up to 10% of the adult population and 15% of hospitalized patients report an allergy to PCN, however clinical studies suggest that 90% of those reporting an allergy can tolerate PCN antibiotics (Kleris & Lugar, 2019).  ?Cross-sensitivity between PCN and cephalosporins has been documented as being as high as 10%, however this estimation included data believed to have been collected in a setting where there was contamination. Newer data suggests that the prevalence of cross-sensitivity between PCN and cephalosporins is actually estimated to be closer to 1% (Hermanides et al., 2018).   ?Patients labeled as PCN allergic, whether they are truly allergic or not, have been found to have inferior outcomes in terms of rates of serious infection, and these patients tend to have longer hospital stays ([place], 2019).  ?Treatment related secondary infections, such as Clostridioides difficile, have been linked to the improper use of broad spectrum antibiotics in patients improperly labeled as PCN allergic (Kleris & Lugar, 2019).  ?Anaphylaxis from cephalosporins is rare and the evidence suggests that there is no increased risk of an anaphylactic type reaction when cephalosporins are used in a PCN allergic patient (Pichichero, 2006). ? ?Citations: ?Hermanides J, Lemkes BA, Prins Pearla Dubonnet MW, [place] I. Presumed ?-Lactam Allergy and Cross-reactivity in the Operating Theater: A Practical Approach. Anesthesiology. 2018 Aug;129(2):335-342. doi: 10.1097/ALN.0000000000002252. PMID: 29924268. ? ?Kleris, [place]., & Lugar, P. L. (2019). Things We Do For No Reason: Failing to Question a Penicillin Allergy History.  Journal  of hospital medicine, 14(10), [phone]. Advance online publication. https://www.wallace-middleton.info/ ? ?Pichichero, M. E. (2006). Cephalosporins can be prescribed safely for penicillin-allergic patients. Journal of family medicine, 55(2), 106-112. Accessed: https://cdn.mdedge.com/files/s49f-public/Document/September-2017/5502JFP_AppliedEvidence1.pdf  ? ?BHonor Loh MSN, APRN, FNP-C, CEN ?[place] ?Peri-operative Services Nurse Practitioner ?FAX: [phone]?03/09/22 9:59 AM ? ?

## 2022-03-13 ENCOUNTER — Encounter: Payer: Self-pay | Admitting: Family Medicine

## 2022-03-13 ENCOUNTER — Ambulatory Visit (INDEPENDENT_AMBULATORY_CARE_PROVIDER_SITE_OTHER): Payer: Medicare Other | Admitting: Family Medicine

## 2022-03-13 VITALS — BP 136/98 | HR 89 | Temp 97.9°F | Resp 16 | Wt 192.7 lb

## 2022-03-13 DIAGNOSIS — Z23 Encounter for immunization: Secondary | ICD-10-CM | POA: Diagnosis not present

## 2022-03-13 DIAGNOSIS — R7303 Prediabetes: Secondary | ICD-10-CM | POA: Diagnosis not present

## 2022-03-13 DIAGNOSIS — E785 Hyperlipidemia, unspecified: Secondary | ICD-10-CM | POA: Diagnosis not present

## 2022-03-13 DIAGNOSIS — L4050 Arthropathic psoriasis, unspecified: Secondary | ICD-10-CM

## 2022-03-13 DIAGNOSIS — N390 Urinary tract infection, site not specified: Secondary | ICD-10-CM

## 2022-03-13 DIAGNOSIS — Z01818 Encounter for other preprocedural examination: Secondary | ICD-10-CM

## 2022-03-13 DIAGNOSIS — I10 Essential (primary) hypertension: Secondary | ICD-10-CM

## 2022-03-13 MED ORDER — HYDROCHLOROTHIAZIDE 12.5 MG PO CAPS: 12.5000 mg | ORAL_CAPSULE | Freq: Every day | ORAL | 4 refills | Status: DC

## 2022-03-13 NOTE — Patient Instructions (Signed)
Please review the attached list of medications and notify my office if there are any errors.  ? ?Please bring back urine specimen on Thursday May 4th to be sent for culture ?

## 2022-03-13 NOTE — Progress Notes (Signed)
?  ? ? ?I,Jana Robinson,acting as a scribe for Lelon Huh, MD.,have documented all relevant documentation on the behalf of Lelon Huh, MD,as directed by  Lelon Huh, MD while in the presence of Lelon Huh, MD. ? ?Established patient visit ? ? ?Patient: Stephanie Shah   DOB: January 20, 1956   66 y.o. Female  MRN: [id number] ?Visit Date: 03/13/2022 ? ?Today's healthcare provider: Lelon Huh, MD  ? ?Chief Complaint  ?Patient presents with  ? Hypertension  ? Hyperlipidemia  ? Headache  ? ?Subjective  ?  ?HPI  ?Lipid/Cholesterol, Follow-up ? ?Last lipid panel Other pertinent labs  ?Lab Results  ?Component Value Date  ? CHOL 186 04/26/2021  ? HDL 61 04/26/2021  ? LDLCALC 108 (H) 04/26/2021  ? LDLDIRECT 105 (H) 04/06/2020  ? TRIG 95 04/26/2021  ? CHOLHDL 3.0 04/26/2021  ? Lab Results  ?Component Value Date  ? ALT 25 03/06/2022  ? AST 27 03/06/2022  ? PLT 422 (H) 03/06/2022  ? TSH 1.230 02/24/2018  ?  ? ?She was last seen for this 11 months ago.  ?Management since that visit includes continue atorvastin. ?She reports excellent compliance with treatment. ?She is not having side effects. ? ?Symptoms: ?No chest pain No chest pressure/discomfort  ?No dyspnea No lower extremity edema  ?No numbness or tingling of extremity No orthopnea  ?No palpitations No paroxysmal nocturnal dyspnea  ?No speech difficulty No syncope  ? ?Current diet: in general, a "healthy" diet   ?Current exercise: none ? ?The 10-year ASCVD risk score (Arnett DK, et al., 2019) is: 7.7% ? ?---------------------------------------------------------------------------------------------------  ?Hypertension, follow-up ? ?BP Readings from Last 3 Encounters:  ?03/13/22 (!) 136/98  ?03/06/22 (!) 123/92  ?04/26/21 134/83  ? Wt Readings from Last 3 Encounters:  ?03/13/22 192 lb 11.2 oz (87.4 kg)  ?03/06/22 191 lb 9.3 oz (86.9 kg)  ?04/26/21 186 lb 12.8 oz (84.7 kg)  ?  ? ?She was last seen for hypertension 11 months ago.  ?BP at that visit was 134 83. Management  since that visit includes continuing the same medications.  ?She reports excellent compliance with treatment. ?She is not having side effects. ?She is following a Regular diet. ?She is not exercising. ?She does not smoke. ? ?Use of agents associated with hypertension: none.  ? ?Outside blood pressures are ?Symptoms: ?No chest pain No chest pressure  ?No palpitations No syncope  ?No dyspnea No orthopnea  ?No paroxysmal nocturnal dyspnea No lower extremity edema  ? ? ?---------------------------------------------------------------------------------------------------  ?Follow up for headaches ? ?The patient was last seen for this 11 months ago. ?Changes made at last visit include fioricet as needed. ?She reports good compliance with treatment. ?She feels that condition is Unchanged. ?She is not having side effects.  ?Patient saw Dr. Education officer, museum and does not have MS.   ?----------------------------------------------------------------------------------------- ? ?Patient to have knee replacement 03-20-22 and off Methotrexate for now. ?Also Hydrochlorothiazide but not sure when to stop. ?--------------------------------------------------------------------------------------------------- ?She also anticipates having knee replacement surgery next week with no history of surgical or anesthetic complications. She was found to have abnormal u/a and >100k e.coli on preops. She is on her last day of antibiotic with no urinary symptoms. Denies chest pains, heart flutters, dyspnea, fevers, chills or sweats.  ? ?Medications: ?Outpatient Medications Prior to Visit  ?Medication Sig Note  ? acetaminophen (TYLENOL) 500 MG tablet Take 1,000 mg by mouth every 6 (six) hours as needed.   ? atorvastatin (LIPITOR) 20 MG tablet Take 1 tablet (20 mg total) by mouth daily.   ?  Brimonidine Tartrate (MIRVASO) 0.33 % GEL Apply 1 application topically in the morning.   ? butalbital-acetaminophen-caffeine (FIORICET) 50-325-40 MG tablet TAKE 1  TABLET BY MOUTH EVERY 6 HOURS AS NEEDED FOR HEADACHE.   ? clobetasol (TEMOVATE) 0.05 % external solution Apply a small amount to scalp twice a day as needed.   ? famotidine (PEPCID) 20 MG tablet Take 20 mg by mouth as needed for heartburn or indigestion.   ? FLUoxetine (PROZAC) 40 MG capsule TAKE ONE CAPSULE BY MOUTH DAILY   ? folic acid (FOLVITE) 1 MG tablet Take 1 mg by mouth daily.   ? hydrochlorothiazide (MICROZIDE) 12.5 MG capsule Take 1 capsule (12.5 mg total) by mouth daily.   ? ketoconazole (NIZORAL) 2 % cream Apply 1 application. topically daily as needed for irritation.   ? lisinopril (ZESTRIL) 40 MG tablet Take 1 tablet (40 mg total) by mouth daily.   ? metroNIDAZOLE (METROGEL) 0.75 % gel Apply 1 application topically 2 (two) times daily.   ? aspirin EC 81 MG tablet Take 81 mg by mouth daily. (Patient not taking: Reported on 03/13/2022) 03/13/2022: Upcoming surgery   ? methotrexate 2.5 MG tablet Take 10 mg by mouth once a week. (Patient not taking: Reported on 03/13/2022) 03/13/2022: Upcoming surgery  ? ?No facility-administered medications prior to visit.  ? ? ? ? ?  Objective  ?  ?BP (!) 136/98 (BP Location: Left Arm, Patient Position: Sitting, Cuff Size: Normal)   Pulse 89   Temp 97.9 ?F (36.6 ?C) (Oral)   Resp 16   Wt 192 lb 11.2 oz (87.4 kg)   SpO2 99%   BMI 36.41 kg/m?  ? ? ?Physical Exam  ? ?General: Appearance:    Obese female in no acute distress  ?Eyes:    PERRL, conjunctiva/corneas clear, EOM's intact       ?Lungs:     Clear to auscultation bilaterally, respirations unlabored  ?Heart:    Normal heart rate. Normal rhythm. No murmurs, rubs, or gallops.    ?MS:   All extremities are intact.    ?Neurologic:   Awake, alert, oriented x 3. No apparent focal neurological defect.   ?   ?  ? ? ? Assessment & Plan  ?  ? ?1. Prediabetes ? ?- Hemoglobin A1c ?Encouraged healthy diet and regular exercise.  ?2. Hyperlipidemia, unspecified hyperlipidemia type ?She is tolerating atorvastatin well with no adverse  effects.   ? ?- Lipid panel ? ?3. Urinary tract infection without hematuria, site unspecified ?Is on her last day of antibiotic for otherwise asymptomatic infection detected on pre-op exam last week.  ? ?She is to bring back urine specimen in 2 days for  Urine Culture ? ?4. Need for vaccination against Streptococcus pneumoniae ? ?- Pneumococcal conjugate vaccine 20-valent (PCV20) ? ?5. Primary hypertension ?Relatively well controlled. Continue current medications.   ? ?6. Preop examination ?Anticipating knee replacement surgery. Completed disability parking for for 6 months placard.  No absolute or relative contraindications for surgery and is medically optimized for procedure.  ?   ? ?The entirety of the information documented in the History of Present Illness, Review of Systems and Physical Exam were personally obtained by me. Portions of this information were initially documented by the CMA and reviewed by me for thoroughness and accuracy.   ? ? ?Lelon Huh, MD  ?[facility] ?[phone] (phone) ?[phone] (fax) ? ?[facility] ?

## 2022-03-14 LAB — HEMOGLOBIN A1C
Est. average glucose Bld gHb Est-mCnc: 126 mg/dL
Hgb A1c MFr Bld: 6 % — ABNORMAL HIGH (ref 4.8–5.6)

## 2022-03-14 LAB — LIPID PANEL
Chol/HDL Ratio: 2.8 ratio (ref 0.0–4.4)
Cholesterol, Total: 168 mg/dL (ref 100–199)
HDL: 60 mg/dL (ref >39)
LDL Chol Calc (NIH): 84 mg/dL (ref 0–99)
Triglycerides: 136 mg/dL (ref 0–149)
VLDL Cholesterol Cal: 24 mg/dL (ref 5–40)

## 2022-03-18 LAB — URINE CULTURE

## 2022-03-18 LAB — SPECIMEN STATUS REPORT

## 2022-03-19 ENCOUNTER — Ambulatory Visit: Payer: Self-pay | Admitting: Family Medicine

## 2022-03-20 ENCOUNTER — Encounter
Admission: RE | Disposition: A | Discharge status: Home / Self Care | Payer: Self-pay | Source: Ambulatory Visit | Attending: Surgery

## 2022-03-20 ENCOUNTER — Other Ambulatory Visit: Payer: Self-pay

## 2022-03-20 ENCOUNTER — Encounter: Payer: Self-pay | Admitting: Surgery

## 2022-03-20 ENCOUNTER — Ambulatory Visit: Payer: Medicare Other | Admitting: Urgent Care

## 2022-03-20 ENCOUNTER — Ambulatory Visit
Admission: RE | Admit: 2022-03-20 | Discharge: 2022-03-20 | Disposition: A | Discharge status: Home / Self Care | Payer: Medicare Other | Source: Ambulatory Visit | Attending: Surgery | Admitting: Surgery

## 2022-03-20 ENCOUNTER — Ambulatory Visit: Payer: Medicare Other

## 2022-03-20 DIAGNOSIS — Z87891 Personal history of nicotine dependence: Secondary | ICD-10-CM | POA: Insufficient documentation

## 2022-03-20 DIAGNOSIS — M1712 Unilateral primary osteoarthritis, left knee: Secondary | ICD-10-CM | POA: Diagnosis present

## 2022-03-20 DIAGNOSIS — N390 Urinary tract infection, site not specified: Secondary | ICD-10-CM | POA: Diagnosis not present

## 2022-03-20 DIAGNOSIS — I10 Essential (primary) hypertension: Secondary | ICD-10-CM | POA: Insufficient documentation

## 2022-03-20 HISTORY — PX: TOTAL KNEE ARTHROPLASTY: SHX125

## 2022-03-20 IMAGING — DX DG KNEE 1-2V PORT*L*
2 series · 2 of 2 positions shown · non-contrast
Comparison: None Available.

CLINICAL DATA: Status post total left knee arthroplasty using
cement.

EXAM:
PORTABLE LEFT KNEE - 1-2 VIEW

[knee ap]
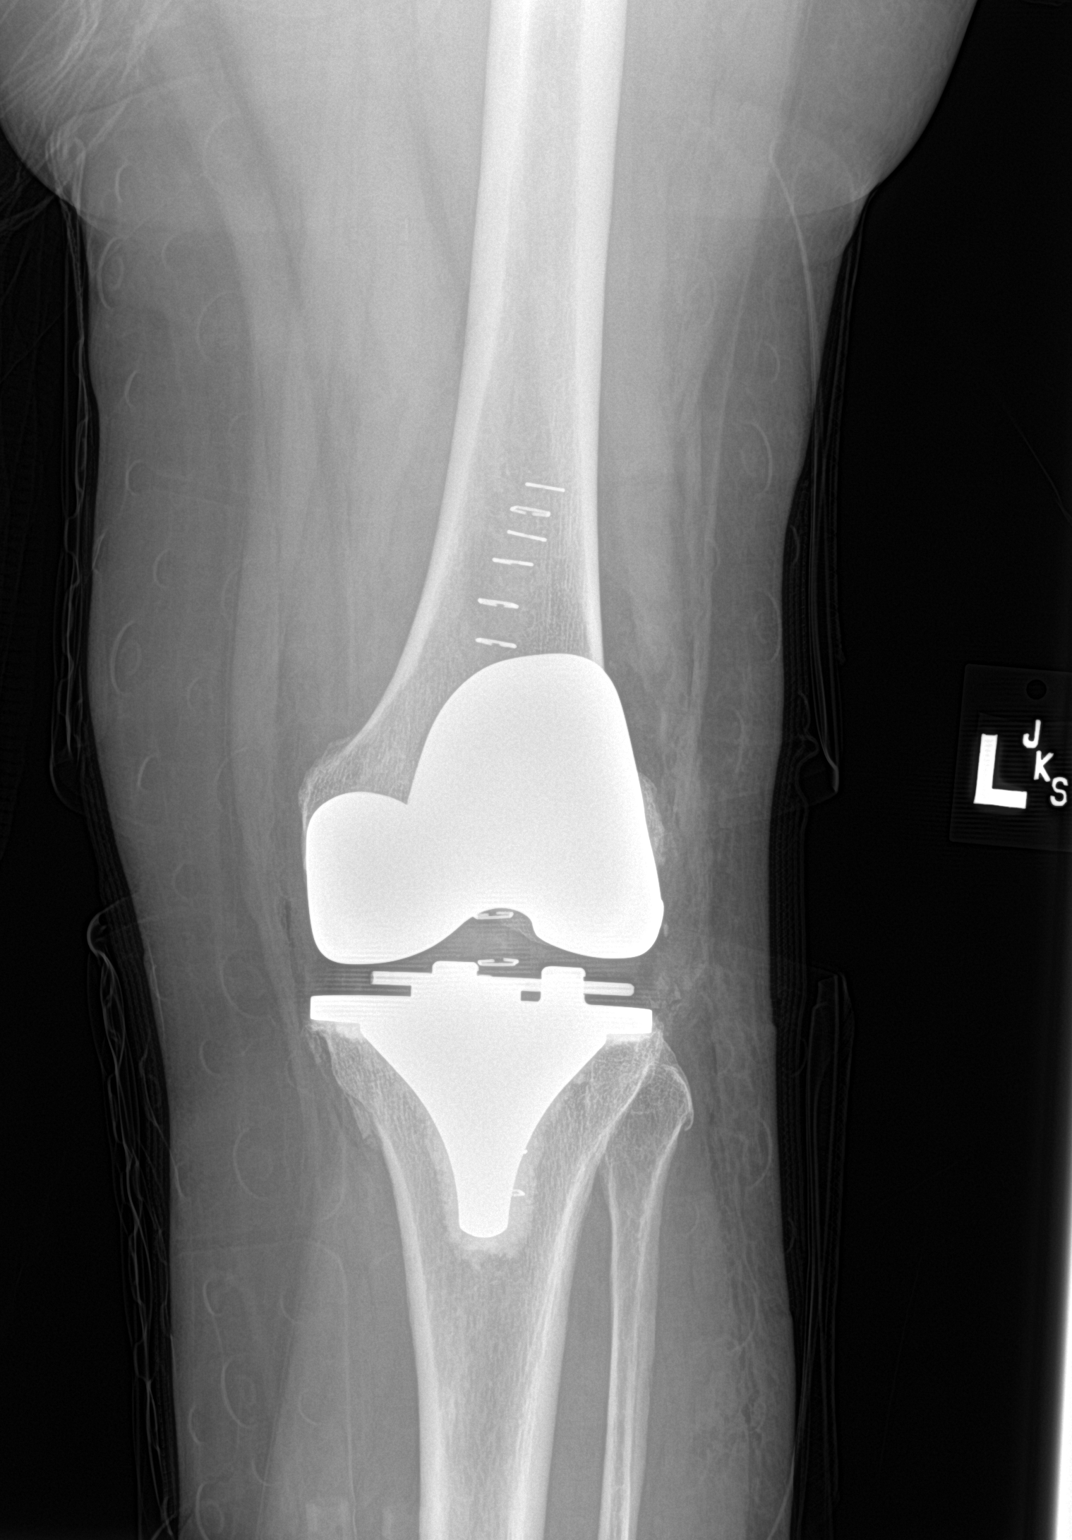

[knee lat]
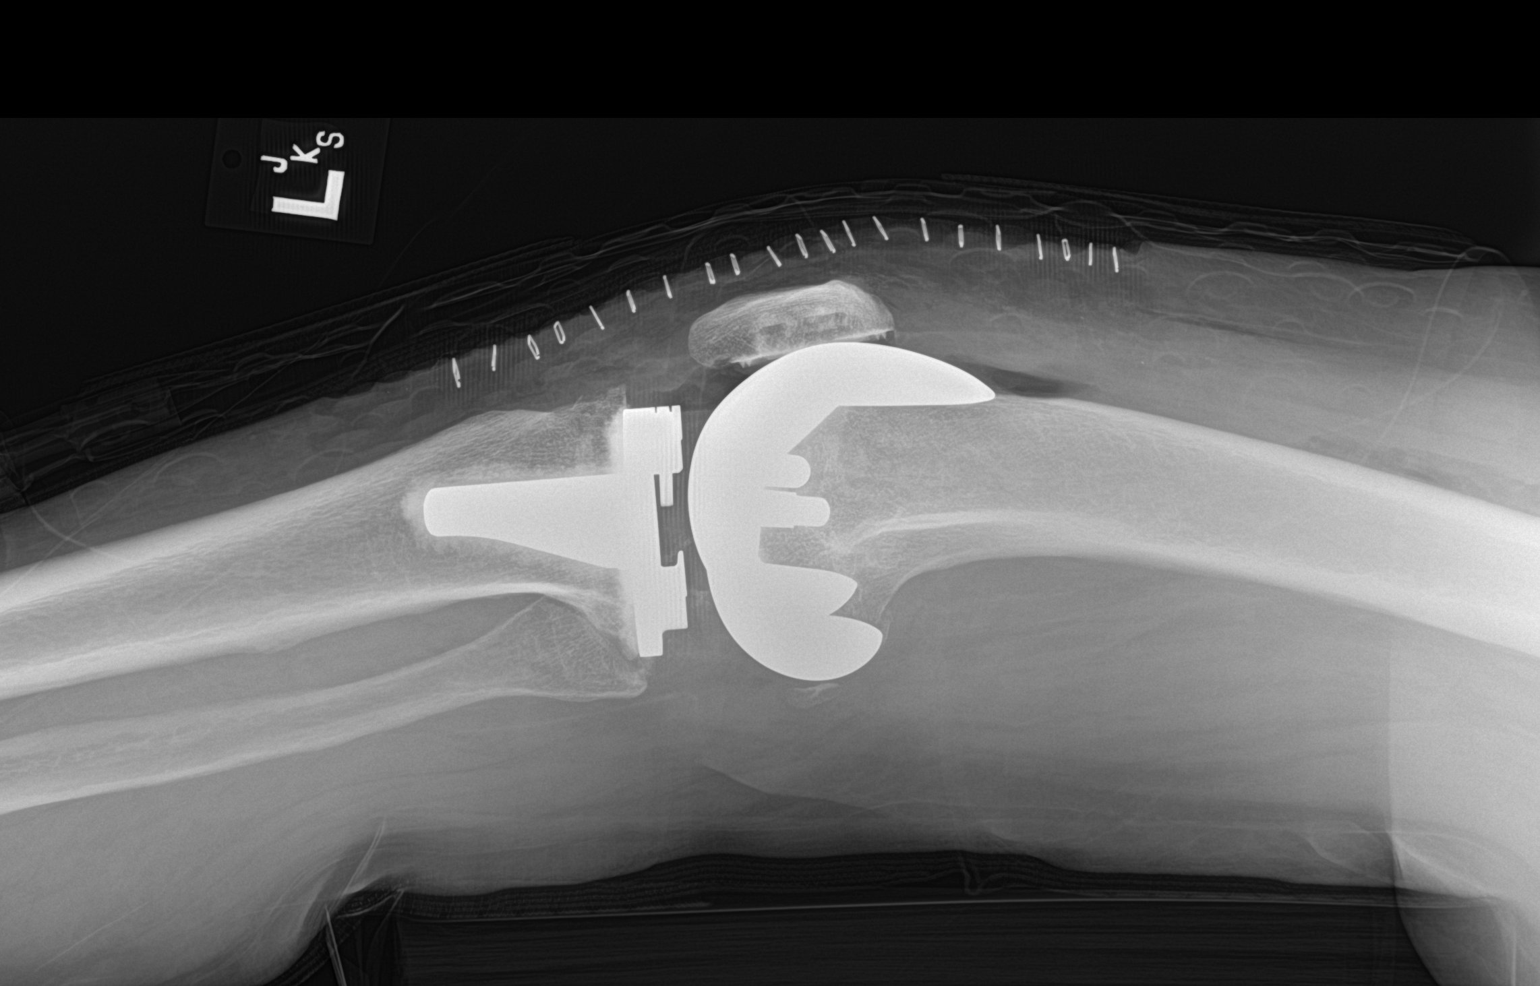

[2 of 2 positions shown; findings below may reference images not displayed]

FINDINGS: Post total left knee arthroplasty. No perihardware lucency is seen
to indicate hardware failure or loosening. Expected postoperative
changes including soft tissue swelling and intra-articular air.
Anterior surgical skin staples. No acute fracture or dislocation.
IMPRESSION: Status post total left knee arthroplasty without evidence of
hardware failure.

## 2022-03-20 SURGERY — ARTHROPLASTY, KNEE, TOTAL
Anesthesia: General | Site: Knee | Laterality: Left

## 2022-03-20 MED ORDER — CEFAZOLIN SODIUM-DEXTROSE 2-4 GM/100ML-% IV SOLN: 2.0000 g | Freq: Four times a day (QID) | INTRAVENOUS | Status: DC

## 2022-03-20 MED ORDER — MIDAZOLAM HCL 5 MG/5ML IJ SOLN
INTRAMUSCULAR | Status: DC | PRN
  Administered 2022-03-20 (×2): 2 mg via INTRAVENOUS

## 2022-03-20 MED ORDER — ACETAMINOPHEN 500 MG PO TABS: 1000.0000 mg | ORAL_TABLET | Freq: Four times a day (QID) | ORAL | Status: DC

## 2022-03-20 MED ORDER — OXYCODONE HCL 5 MG PO TABS: 5.0000 mg | ORAL_TABLET | Freq: Once | ORAL | Status: DC | PRN

## 2022-03-20 MED ORDER — ORAL CARE MOUTH RINSE: 15.0000 mL | Freq: Once | OROMUCOSAL | Status: AC

## 2022-03-20 MED ORDER — ONDANSETRON HCL 4 MG PO TABS: 4.0000 mg | ORAL_TABLET | Freq: Four times a day (QID) | ORAL | Status: DC | PRN

## 2022-03-20 MED ORDER — PHENYLEPHRINE HCL-NACL 20-0.9 MG/250ML-% IV SOLN
INTRAVENOUS | Status: DC | PRN
  Administered 2022-03-20: 40 ug/min via INTRAVENOUS

## 2022-03-20 MED ORDER — PHENYLEPHRINE HCL (PRESSORS) 10 MG/ML IV SOLN
INTRAVENOUS | Status: DC | PRN
Start: 2022-03-20 — End: 2022-03-20
  Administered 2022-03-20: 80 ug via INTRAVENOUS

## 2022-03-20 MED ORDER — 0.9 % SODIUM CHLORIDE (POUR BTL) OPTIME
TOPICAL | Status: DC | PRN
  Administered 2022-03-20: 500 mL

## 2022-03-20 MED ORDER — SODIUM CHLORIDE (PF) 0.9 % IJ SOLN
INTRAMUSCULAR | Status: DC | PRN
  Administered 2022-03-20: 90 mL via INTRAMUSCULAR

## 2022-03-20 MED ORDER — OXYCODONE HCL 5 MG PO TABS
5.0000 mg | ORAL_TABLET | ORAL | Status: DC | PRN
  Administered 2022-03-20: 5 mg via ORAL

## 2022-03-20 MED ORDER — OXYCODONE HCL 5 MG PO TABS
ORAL_TABLET | ORAL | Status: AC
  Filled 2022-03-20: qty 1

## 2022-03-20 MED ORDER — OXYCODONE HCL 5 MG/5ML PO SOLN: 5.0000 mg | Freq: Once | ORAL | Status: DC | PRN

## 2022-03-20 MED ORDER — FENTANYL CITRATE (PF) 100 MCG/2ML IJ SOLN: 25.0000 ug | INTRAMUSCULAR | Status: DC | PRN

## 2022-03-20 MED ORDER — KETOROLAC TROMETHAMINE 30 MG/ML IJ SOLN
INTRAMUSCULAR | Status: DC | PRN
  Administered 2022-03-20: 30 mg via INTRAVENOUS

## 2022-03-20 MED ORDER — METOCLOPRAMIDE HCL 10 MG PO TABS: 5.0000 mg | ORAL_TABLET | Freq: Three times a day (TID) | ORAL | Status: DC | PRN

## 2022-03-20 MED ORDER — OXYCODONE HCL 5 MG PO TABS
ORAL_TABLET | ORAL | Status: AC
  Administered 2022-03-20: 5 mg via ORAL
  Filled 2022-03-20: qty 1

## 2022-03-20 MED ORDER — TRANEXAMIC ACID 1000 MG/10ML IV SOLN
INTRAVENOUS | Status: DC | PRN
  Administered 2022-03-20: 1000 mg via TOPICAL

## 2022-03-20 MED ORDER — OXYCODONE HCL 5 MG PO TABS: 5.0000 mg | ORAL_TABLET | ORAL | 0 refills | Status: DC | PRN

## 2022-03-20 MED ORDER — ONDANSETRON HCL 4 MG/2ML IJ SOLN: 4.0000 mg | Freq: Four times a day (QID) | INTRAMUSCULAR | Status: DC | PRN

## 2022-03-20 MED ORDER — SODIUM CHLORIDE 0.9 % IV SOLN: INTRAVENOUS | Status: DC

## 2022-03-20 MED ORDER — CEFAZOLIN SODIUM-DEXTROSE 2-4 GM/100ML-% IV SOLN
INTRAVENOUS | Status: AC
  Administered 2022-03-20: 2 g via INTRAVENOUS
  Filled 2022-03-20: qty 100

## 2022-03-20 MED ORDER — METOCLOPRAMIDE HCL 5 MG/ML IJ SOLN: 5.0000 mg | Freq: Three times a day (TID) | INTRAMUSCULAR | Status: DC | PRN

## 2022-03-20 MED ORDER — ONDANSETRON HCL 4 MG/2ML IJ SOLN: 4.0000 mg | Freq: Once | INTRAMUSCULAR | Status: DC | PRN

## 2022-03-20 MED ORDER — CEFAZOLIN SODIUM-DEXTROSE 2-4 GM/100ML-% IV SOLN
2.0000 g | Freq: Once | INTRAVENOUS | Status: AC
  Administered 2022-03-20: 2 g via INTRAVENOUS

## 2022-03-20 MED ORDER — CHLORHEXIDINE GLUCONATE 0.12 % MT SOLN
15.0000 mL | Freq: Once | OROMUCOSAL | Status: AC
  Administered 2022-03-20: 15 mL via OROMUCOSAL

## 2022-03-20 MED ORDER — PROPOFOL 500 MG/50ML IV EMUL
INTRAVENOUS | Status: DC | PRN
  Administered 2022-03-20: 75 ug/kg/min via INTRAVENOUS

## 2022-03-20 MED ORDER — SODIUM CHLORIDE 0.9 % IR SOLN
Status: DC | PRN
  Administered 2022-03-20: 3000 mL

## 2022-03-20 MED ORDER — PHENYLEPHRINE HCL (PRESSORS) 10 MG/ML IV SOLN
INTRAVENOUS | Status: AC
  Filled 2022-03-20: qty 1

## 2022-03-20 MED ORDER — APIXABAN 2.5 MG PO TABS: 2.5000 mg | ORAL_TABLET | Freq: Two times a day (BID) | ORAL | 0 refills | Status: DC

## 2022-03-20 MED ORDER — TRANEXAMIC ACID 1000 MG/10ML IV SOLN
INTRAVENOUS | Status: AC
Start: 2022-03-20 — End: ?
  Filled 2022-03-20: qty 10

## 2022-03-20 MED ORDER — KETOROLAC TROMETHAMINE 15 MG/ML IJ SOLN: 7.5000 mg | Freq: Four times a day (QID) | INTRAMUSCULAR | Status: DC

## 2022-03-20 MED ORDER — SODIUM CHLORIDE 0.9 % BOLUS PEDS
250.0000 mL | Freq: Once | INTRAVENOUS | Status: AC
  Administered 2022-03-20: 250 mL via INTRAVENOUS

## 2022-03-20 MED ORDER — MIDAZOLAM HCL 2 MG/2ML IJ SOLN
INTRAMUSCULAR | Status: AC
  Filled 2022-03-20: qty 2

## 2022-03-20 MED ORDER — PROPOFOL 1000 MG/100ML IV EMUL
INTRAVENOUS | Status: AC
  Filled 2022-03-20: qty 100

## 2022-03-20 MED ORDER — ACETAMINOPHEN 10 MG/ML IV SOLN: 1000.0000 mg | Freq: Once | INTRAVENOUS | Status: DC | PRN

## 2022-03-20 MED ORDER — LACTATED RINGERS IV SOLN: INTRAVENOUS | Status: DC

## 2022-03-20 MED ORDER — SODIUM CHLORIDE 0.9 % BOLUS PEDS
250.0000 mL | Freq: Once | INTRAVENOUS | Status: DC
Start: 2022-03-20 — End: 2022-03-20

## 2022-03-20 MED ORDER — BUPIVACAINE LIPOSOME 1.3 % IJ SUSP
INTRAMUSCULAR | Status: AC
  Filled 2022-03-20: qty 20

## 2022-03-20 MED ORDER — SODIUM CHLORIDE FLUSH 0.9 % IV SOLN
INTRAVENOUS | Status: AC
Start: 2022-03-20 — End: ?
  Filled 2022-03-20: qty 40

## 2022-03-20 MED ORDER — CEFAZOLIN SODIUM-DEXTROSE 2-4 GM/100ML-% IV SOLN
INTRAVENOUS | Status: AC
  Filled 2022-03-20: qty 100

## 2022-03-20 MED ORDER — BUPIVACAINE HCL (PF) 0.5 % IJ SOLN
INTRAMUSCULAR | Status: AC
  Filled 2022-03-20: qty 10

## 2022-03-20 MED ORDER — STERILE WATER FOR IRRIGATION IR SOLN
Status: DC | PRN
  Administered 2022-03-20: 1000 mL

## 2022-03-20 MED ORDER — KETOROLAC TROMETHAMINE 30 MG/ML IJ SOLN
INTRAMUSCULAR | Status: AC
  Filled 2022-03-20: qty 1

## 2022-03-20 MED ORDER — BUPIVACAINE-EPINEPHRINE (PF) 0.5% -1:200000 IJ SOLN
INTRAMUSCULAR | Status: AC
Start: 2022-03-20 — End: ?
  Filled 2022-03-20: qty 30

## 2022-03-20 MED ORDER — KETOROLAC TROMETHAMINE 15 MG/ML IJ SOLN: 15.0000 mg | Freq: Once | INTRAMUSCULAR | Status: DC

## 2022-03-20 SURGICAL SUPPLY — 65 items
APL PRP STRL LF DISP 70% ISPRP (MISCELLANEOUS) ×1
BIT DRILL QUICK REL 1/8 2PK SL (DRILL) IMPLANT
BLADE SAW SAG 25X90X1.19 (BLADE) ×2 IMPLANT
BLADE SURG SZ20 CARB STEEL (BLADE) ×2 IMPLANT
BNDG CMPR STD VLCR NS LF 5.8X6 (GAUZE/BANDAGES/DRESSINGS) ×1
BNDG ELASTIC 6X5.8 VLCR NS LF (GAUZE/BANDAGES/DRESSINGS) ×2 IMPLANT
BRNG TIB 63X10 ANT STAB STRL (Insert) ×1 IMPLANT
CEMENT BONE R 1X40 (Cement) ×4 IMPLANT
CEMENT VACUUM MIXING SYSTEM (MISCELLANEOUS) ×2 IMPLANT
CHLORAPREP W/TINT 26 (MISCELLANEOUS) ×2 IMPLANT
COMP FEMORAL CR LEFT 60 (Joint) ×2 IMPLANT
COMP PAT 3 PEG SERIES A 31/6.2 (Miscellaneous) ×2 IMPLANT
COMPONENT FEMORAL CR LEFT 60 (Joint) IMPLANT
COMPONENT PAT3 PEG SERS 31/6.2 (Miscellaneous) IMPLANT
COOLER POLAR GLACIER W/PUMP (MISCELLANEOUS) ×2 IMPLANT
COVER MAYO STAND REUSABLE (DRAPES) ×2 IMPLANT
CUFF TOURN SGL QUICK 24 (TOURNIQUET CUFF)
CUFF TOURN SGL QUICK 34 (TOURNIQUET CUFF)
CUFF TRNQT CYL 24X4X16.5-23 (TOURNIQUET CUFF) IMPLANT
CUFF TRNQT CYL 34X4.125X (TOURNIQUET CUFF) IMPLANT
DRAPE 3/4 80X56 (DRAPES) ×2 IMPLANT
DRAPE IMP U-DRAPE 54X76 (DRAPES) ×2 IMPLANT
DRAPE U-SHAPE 47X51 STRL (DRAPES) ×2 IMPLANT
DRILL QUICK RELEASE 1/8 INCH (DRILL) ×3
DRSG MEPILEX SACRM 8.7X9.8 (GAUZE/BANDAGES/DRESSINGS) IMPLANT
DRSG OPSITE POSTOP 4X10 (GAUZE/BANDAGES/DRESSINGS) ×2 IMPLANT
DRSG OPSITE POSTOP 4X8 (GAUZE/BANDAGES/DRESSINGS) ×2 IMPLANT
ELECT REM PT RETURN 9FT ADLT (ELECTROSURGICAL) ×2
ELECTRODE REM PT RTRN 9FT ADLT (ELECTROSURGICAL) ×1 IMPLANT
GAUZE XEROFORM 1X8 LF (GAUZE/BANDAGES/DRESSINGS) ×2 IMPLANT
GLOVE BIO SURGEON STRL SZ7.5 (GLOVE) ×8 IMPLANT
GLOVE BIO SURGEON STRL SZ8 (GLOVE) ×8 IMPLANT
GLOVE BIOGEL PI IND STRL 8 (GLOVE) ×1 IMPLANT
GLOVE BIOGEL PI INDICATOR 8 (GLOVE) ×1
GLOVE SURG UNDER LTX SZ8 (GLOVE) ×2 IMPLANT
GOWN STRL REUS W/ TWL LRG LVL3 (GOWN DISPOSABLE) ×1 IMPLANT
GOWN STRL REUS W/ TWL XL LVL3 (GOWN DISPOSABLE) ×1 IMPLANT
GOWN STRL REUS W/TWL LRG LVL3 (GOWN DISPOSABLE) ×2
GOWN STRL REUS W/TWL XL LVL3 (GOWN DISPOSABLE) ×2
HOOD PEEL AWAY FLYTE STAYCOOL (MISCELLANEOUS) ×6 IMPLANT
INSERT TIB BEARING 63X10 (Insert) ×1 IMPLANT
IV NS IRRIG 3000ML ARTHROMATIC (IV SOLUTION) ×2 IMPLANT
KIT TURNOVER KIT A (KITS) ×2 IMPLANT
MANIFOLD NEPTUNE II (INSTRUMENTS) ×2 IMPLANT
NDL SPNL 20GX3.5 QUINCKE YW (NEEDLE) ×1 IMPLANT
NEEDLE SPNL 20GX3.5 QUINCKE YW (NEEDLE) ×2 IMPLANT
NS IRRIG 1000ML POUR BTL (IV SOLUTION) ×2 IMPLANT
PACK TOTAL KNEE (MISCELLANEOUS) ×2 IMPLANT
PAD WRAPON POLAR KNEE (MISCELLANEOUS) ×1 IMPLANT
PENCIL SMOKE EVACUATOR (MISCELLANEOUS) ×2 IMPLANT
PLATE TIBIAL W LOCK BAR 63MM (Plate) ×1 IMPLANT
PULSAVAC PLUS IRRIG FAN TIP (DISPOSABLE) ×2
STAPLER SKIN PROX 35W (STAPLE) ×2 IMPLANT
SUCTION FRAZIER HANDLE 10FR (MISCELLANEOUS) ×1
SUCTION TUBE FRAZIER 10FR DISP (MISCELLANEOUS) ×1 IMPLANT
SUT VIC AB 0 CT1 36 (SUTURE) ×6 IMPLANT
SUT VIC AB 2-0 CT1 27 (SUTURE) ×6
SUT VIC AB 2-0 CT1 TAPERPNT 27 (SUTURE) ×3 IMPLANT
SYR 10ML LL (SYRINGE) ×2 IMPLANT
SYR 20ML LL LF (SYRINGE) ×2 IMPLANT
SYR 30ML LL (SYRINGE) IMPLANT
TIP FAN IRRIG PULSAVAC PLUS (DISPOSABLE) ×1 IMPLANT
TRAP FLUID SMOKE EVACUATOR (MISCELLANEOUS) ×2 IMPLANT
WATER STERILE IRR 500ML POUR (IV SOLUTION) ×2 IMPLANT
WRAPON POLAR PAD KNEE (MISCELLANEOUS) ×2

## 2022-03-20 NOTE — Evaluation (Signed)
Physical Therapy Evaluation ?Patient Details ?Name: Stephanie Shah ?MRN: [id number] ?DOB: Oct 18, 1956 ?Today's Date: 03/20/2022 ? ?History of Present Illness ? Pt is s/p elective L TKA on 03/20/22  ?Clinical Impression ? Pt admitted with above diagnosis. Pt received upright in bed with husband at bedside. Normal sensation to LT, able to DF/PF at ankle and perform SLR on LLE. Pt reports being independent at baseline. Education provided on HEP packet with reps, sets, frequency completing 10 reps of exercises below with min VC's for form/technique with good carryover. Pt mod-I with bed mobility sitting EoB and impulsively standing to RW with supervision with LLE buckling. Pt able to recover with RW and minguard from PT. After recovery, pt able to tolerate gait initially step to to step through with VC's with min VC's for RW sequencing. Pt tolerating lap around Post op with good stability but does demo heavy UE reliance on RW. At stairs pt and spouse educated on two techniques as pt has stairs with R rail and stairs without rails. Unsure of stability of railing so PT educated pt and spouse on both techniques. After PT demo, pt tolerating both techniques as recorded below both with rail use and with RW safely with good safety with spouse educated on safe guarding techniques. Pt returned to room with all needs in reach. Pt safe for SDDC with HH PT services. All education complete with pt and spouse understanding of instruction. Pt currently with functional limitations due to the deficits listed below (see PT Problem List). Pt will benefit from skilled PT to increase their independence and safety with mobility to allow discharge to the venue listed below.    ?   ? ?Recommendations for follow up therapy are one component of a multi-disciplinary discharge planning process, led by the attending physician.  Recommendations may be updated based on patient status, additional functional criteria and insurance authorization. ? ?Follow Up  Recommendations Home health PT ? ?  ?Assistance Recommended at Discharge Intermittent Supervision/Assistance  ?Patient can return home with the following ? A little help with walking and/or transfers;A little help with bathing/dressing/bathroom;Assist for transportation;Help with stairs or ramp for entrance ? ?  ?Equipment Recommendations None recommended by PT  ?Recommendations for Other Services ?    ?  ?Functional Status Assessment Patient has had a recent decline in their functional status and demonstrates the ability to make significant improvements in function in a reasonable and predictable amount of time.  ? ?  ?Precautions / Restrictions Precautions ?Precautions: Knee ?Precaution Booklet Issued: Yes (comment) ?Restrictions ?Weight Bearing Restrictions: Yes ?LLE Weight Bearing: Weight bearing as tolerated  ? ?  ? ?Mobility ? Bed Mobility ?Overal bed mobility: Modified Independent ?  ?  ?  ?  ?  ?  ?  ?Patient Response: Cooperative ? ?Transfers ?Overall transfer level: Needs assistance ?Equipment used: None ?Transfers: Sit to/from Stand ?Sit to Stand: Supervision ?  ?  ?  ?  ?  ?General transfer comment: Initial L knee buckling in standing but pt able to correct on her own. ?  ? ?Ambulation/Gait ?Ambulation/Gait assistance: Supervision ?Gait Distance (Feet): 150 Feet ?Assistive device: Rolling walker (2 wheels) ?Gait Pattern/deviations: Step-through pattern, Decreased step length - left, Decreased stance time - left, Decreased stride length ?  ?  ?  ?General Gait Details: heavy UE reliance on RW but able to progress to antalgic, step through gait. ? ?Stairs ?Stairs: Yes ?  ?Stair Management: With walker, One rail Right, Step to pattern, Forwards, Backwards ?Number of Stairs:  8 ?General stair comments: Trialed two techniques as pt has option with single rail with stairs and option of stairs without rails. Pt performing forwards step to gait with 4 steps with BUE support on R rail asc/desc. Good stability, min  VC's for LE sequencing. Trialed then backwards approach with RW step to gait. Pt performed successfully and safely as well with min VC's for RW placement. Husband educated on safe guarding. ? ?Wheelchair Mobility ?  ? ?Modified Rankin (Stroke Patients Only) ?  ? ?  ? ?Balance Overall balance assessment: Needs assistance ?Sitting-balance support: Bilateral upper extremity supported, Feet supported ?Sitting balance-Leahy Scale: Fair ?  ?  ?  ?Standing balance-Leahy Scale: Fair ?Standing balance comment: Initially poor with LLE buckling but as pt stands at RW, able to stand without UE support. ?  ?  ?  ?  ?  ?  ?  ?  ?  ?  ?  ?   ? ? ? ?Pertinent Vitals/Pain Pain Assessment ?Pain Assessment: Faces ?Faces Pain Scale: Hurts a little bit ?Pain Location: L knee ?Pain Descriptors / Indicators: Dull, Grimacing, Guarding ?Pain Intervention(s): Limited activity within patient's tolerance, Monitored during session, Repositioned  ? ? ?Home Living Family/patient expects to be discharged to:: Private residence ?Living Arrangements: Spouse/significant other ?Available Help at Discharge: Family ?Type of Home: House ?Home Access: Stairs to enter (multiple options to enter home with rails and no rails.) ?Entrance Stairs-Rails: Right;Left;None ?Entrance Stairs-Number of Steps: 3-4 from garage, no rails. Front has 4-5 STE with R rail. ?  ?Home Layout: One level ?Home Equipment: Conservation officer, nature (2 wheels);Shower seat;Toilet riser ?   ?  ?Prior Function Prior Level of Function : Independent/Modified Independent ?  ?  ?  ?  ?  ?  ?  ?  ?  ? ? ?Hand Dominance  ?   ? ?  ?Extremity/Trunk Assessment  ? Upper Extremity Assessment ?Upper Extremity Assessment: Generalized weakness ?  ? ?Lower Extremity Assessment ?Lower Extremity Assessment: Generalized weakness;LLE deficits/detail ?LLE Deficits / Details: L TKA ?LLE Sensation: WNL ?  ? ?Cervical / Trunk Assessment ?Cervical / Trunk Assessment: Normal  ?Communication  ? Communication: No  difficulties  ?Cognition Arousal/Alertness: Awake/alert ?Behavior During Therapy: [facility] for tasks assessed/performed ?Overall Cognitive Status: Within Functional Limits for tasks assessed ?  ?  ?  ?  ?  ?  ?  ?  ?  ?  ?  ?  ?  ?  ?  ?  ?General Comments: Pleasant and cooperative ?  ?  ? ?  ?General Comments   ? ?  ?Exercises Total Joint Exercises ?Ankle Circles/Pumps: AROM, Strengthening, Both, 10 reps, Supine ?Quad Sets: AROM, Strengthening, Left, 10 reps, Supine ?Short Arc Quad: AROM, Strengthening, Left, 10 reps, Supine ?Heel Slides: AROM, Strengthening, Left, 10 reps, Supine ?Straight Leg Raises: AROM, Strengthening, Left, 10 reps, Supine ?Marching in Standing: AROM, Strengthening, Both, 5 reps ?Other Exercises ?Other Exercises: Role of PT in acute setting, d/c recs, WB precautions, HEP packet, stair training, car transfer, appropriate L knee positioning to prevent joint contracture.  ? ?Assessment/Plan  ?  ?PT Assessment Patient needs continued PT services  ?PT Problem List Decreased strength;Decreased mobility;Decreased safety awareness;Decreased range of motion;Decreased balance;Decreased knowledge of use of DME ? ?   ?  ?PT Treatment Interventions DME instruction;Therapeutic exercise;Gait training;Balance training;Stair training;Neuromuscular re-education;Functional mobility training;Therapeutic activities;Patient/family education   ? ?PT Goals (Current goals can be found in the Care Plan section)  ?Acute Rehab PT Goals ?Patient Stated Goal: to  go home ?PT Goal Formulation: With patient/family ?Time For Goal Achievement: 04/03/22 ?Potential to Achieve Goals: Good ? ?  ?Frequency BID ?  ? ? ?Co-evaluation   ?  ?  ?  ?  ? ? ?  ?AM-PAC PT "6 Clicks" Mobility  ?Outcome Measure Help needed turning from your back to your side while in a flat bed without using bedrails?: A Little ?Help needed moving from lying on your back to sitting on the side of a flat bed without using bedrails?: A Little ?Help needed moving to  and from a bed to a chair (including a wheelchair)?: A Little ?Help needed standing up from a chair using your arms (e.g., wheelchair or bedside chair)?: A Little ?Help needed to walk in hospital room?: A Little

## 2022-03-20 NOTE — Anesthesia Procedure Notes (Addendum)
Spinal ? ?Patient location during procedure: OR ?Start time: 03/20/2022 7:38 AM ?End time: 03/20/2022 7:46 AM ?Reason for block: surgical anesthesia ?Staffing ?Anesthesiologist: Darrin Nipper, MD ?Resident/CRNA: Fredderick Phenix, CRNA ?Preanesthetic Checklist ?Completed: patient identified, IV checked, site marked, risks and benefits discussed, surgical consent, monitors and equipment checked, pre-op evaluation and timeout performed ?Spinal Block ?Patient position: sitting ?Prep: DuraPrep ?Patient monitoring: heart rate, cardiac monitor, continuous pulse ox and blood pressure ?Approach: midline ?Location: L3-4 ?Injection technique: single-shot ?Needle ?Needle type: Sprotte  ?Needle gauge: 24 G ?Needle length: 9 cm ?Assessment ?Sensory level: T4 ?Events: CSF return ? ? ? ?

## 2022-03-20 NOTE — H&P (Signed)
History of Present Illness:  ?Stephanie Shah is a 66 y.o. female who presents today for her surgical history and physical for upcoming left total knee arthroplasty. Surgery is scheduled for 03/20/2022. The patient is scheduled undergo surgery with Dr. Roland Rack. The patient continues to report an aching and throbbing discomfort in the left knee. Pain score today is a 5 out of 10 to the left lower extremity. The patient has undergone both steroid injections in the past in addition to viscosupplementation which have not provided significant relief for the patient. She denies any numbness or tingling to the left lower extremity. Pain is located both along the medial and lateral joint line of the left knee. She denies any recent trauma or injury affecting the left knee since her last evaluation. She denies any personal history of heart attack or stroke. She denies any history of asthma or COPD. She denies any history of blood clots. The patient did go for preop lab testing, she states that her urinalysis did demonstrate evidence of a underlying urinary tract infection however she states that she is not experiencing any symptoms at today's visit. She has not noticed any foul odor or any increase in urinary frequency at this time. ? ?Current Outpatient Medications:  ? acetaminophen (TYLENOL) 500 MG tablet Take 1,000 mg by mouth every 6 (six) hours as needed  ? aspirin 81 MG EC tablet Take 81 mg by mouth once daily  ? atorvastatin (LIPITOR) 20 MG tablet Take 20 mg by mouth once daily 0  ? butalbital-acetaminophen-caffeine (FIORICET) 50-325-40 mg tablet Take 1 tablet by mouth every 6 (six) hours as needed for Headache (FOR HEADACHE)  ? famotidine (PEPCID) 20 MG tablet Take by mouth Take 20 mg by mouth as needed for heartburn or indigestion  ? FLUoxetine (PROZAC) 40 MG capsule Take 40 mg by mouth once daily 1  ? folic acid (FOLVITE) 1 MG tablet Take 1 tablet (1 mg total) by mouth once daily 90 tablet 3  ? hydroCHLOROthiazide  (MICROZIDE) 12.5 mg capsule TAKE 1 CAPSULE BY MOUTH EVERY DAY (*EARLY WILL COVER 09/04/17) 0  ? ketoconazole (NIZORAL) 2 % cream Apply topically Apply 1 application. topically daily as needed for irritation.  ? lisinopril (PRINIVIL,ZESTRIL) 40 MG tablet TAKE 1 TABLET BY MOUTH EVERY DAY (*EARLY WILL COVER 09/04/17) 0  ? methotrexate (RHEUMATREX) 2.5 MG tablet Take 10 mg by mouth every 7 (seven) days  ? metroNIDAZOLE (METROGEL) 0.75 % gel Apply 1 Application topically 2 (two) times daily  ? multivitamin tablet Take 1 tablet by mouth once daily  ? ciclopirox (LOPROX) 0.77 % Susp topical suspension Apply topically (Patient not taking: Reported on 03/08/2022)  ? ?Allergies:  ? Penicillins Rash  ? ?Past Medical History:  ? Osteoarthritis  ? Chicken pox  ? Hyperlipidemia  ? Hypertension  ? Multiple sclerosis (CMS-HCC)  ? Shingles  ? ?Past Surgical History:  ? ARTHROSCOPIC SUBACROMIAL DECOMPRESSION, ROTATOR CUFF REPAIR, BICEPS TENODESIS CLAVICLE RESECTION 08/18/2013 (DR. HARRISON)  ? ADENOIDECTOMY  ? CESAREAN SECTION  ? COLONOSCOPY  ? TONSILLECTOMY  ? WISDOM TEETH  ? ?Family History:  ? Pancreatic cancer Mother  ? Alzheimer's disease Mother  ? Heart disease Father  ? Psoriasis Father  ? High blood pressure (Hypertension) Sister  ? ?Social History:  ? ?Socioeconomic History:  ? Marital status: Married  ?Tobacco Use  ? Smoking status: Former  ? Smokeless tobacco: Never  ?Vaping Use  ? Vaping Use: Never used  ?Substance and Sexual Activity  ? Alcohol use: Yes  ?  Drug use: Never  ? Sexual activity: Yes  ?Partners: Male  ? ?Review of Systems:  ?A comprehensive 14 point ROS was performed, reviewed, and the pertinent orthopaedic findings are documented in the HPI. ? ?Physical Exam: ?BP 136/84  Ht 152.5 cm (5' 0.04")  Wt 89.3 kg (196 lb 12.8 oz)  BMI 38.38 kg/m?  ?General/Constitutional: The patient appears to be well-nourished, well-developed, and in no acute distress. ?Neuro/Psych: Normal mood and affect, oriented to person,  place and time. ?Eyes: Non-icteric. Pupils are equal, round, and reactive to light, and exhibit synchronous movement. ?ENT: Unremarkable. ?Lymphatic: No palpable adenopathy. ?Respiratory: Lungs clear to auscultation, Normal chest excursion, No wheezes and Non-labored breathing ?Cardiovascular: Regular rate and rhythm. No murmurs. and No edema, swelling or tenderness, except as noted in detailed exam. ?Integumentary: No impressive skin lesions present, except as noted in detailed exam. ?Musculoskeletal: Unremarkable, except as noted in detailed exam. ? ?Left knee exam: ?GAIT: Mildly antalgic gait, but uses no assistive devices. ?ALIGNMENT: Mild varus ?SKIN: Unremarkable ?SWELLING: Minimal ?EFFUSION: Trace ?WARMTH: None ?TENDERNESS: Moderate tenderness along medial joint line, mild tenderness along lateral joint line ?ROM: 5-110 degrees with mild pain at maximal flexion ?McMURRAY'S: Equivocally positive ?PATELLOFEMORAL: Normal tracking with no peri-patellar tenderness and negative apprehension sign ?CREPITUS: None ?LACHMAN'S: Negative ?PIVOT SHIFT: Negative ?ANTERIOR DRAWER: Negative ?POSTERIOR DRAWER: Negative ?VARUS/VALGUS: Mildly positive pseudolaxity to varus stressing ?  ?She remains neurovascularly intact to the left lower extremity and foot. ?  ?Knee Imaging: ?AP weightbearing of both knees, as well as lateral and merchant views of the left knee are obtained. These films demonstrate severe degenerative changes, primarily involving the medial compartment with 100% medial joint space narrowing. Overall alignment is mild varus. No fractures, lytic lesions, or abnormal calcifications are noted. The images were discussed in detail with the patient at today's visit. ? ?Assessment: ? Primary osteoarthritis of left knee.   ? ?Plan: ?1. Treatment options were discussed today with the patient. ?2. The patient is scheduled for a left total knee arthroplasty with Dr. Roland Rack on 03/20/2022. ?3. Discussed in detail the risk and  benefits of surgery at today's visit. The patient would like to proceed with surgery at this time. Medical history was reviewed at today's visit. ?4. The patient did undergo urinalysis at the hospital which did demonstrate evidence of UTI. We will recheck today prior to prescribing antibiotics. ?5. The patient will follow-up per standard postop protocol. ?6. This document will serve as a surgical history and physical for the patient.  ?7. She will follow-up personal postop protocol. ?8. The patient can contact the clinic if she has any questions, new symptoms develop or symptoms worsen. ? ?The procedure was discussed with the patient, as were the potential risks (including bleeding, infection, nerve and/or blood vessel injury, persistent or recurrent pain, failure of the hardware, stiffness, amputation, need for further surgery, blood clots, strokes, heart attacks and/or arhythmias, pneumonia, etc.) and benefits. The patient states her understanding and wishes to proceed. ? ? ?H&P reviewed and patient re-examined. No changes. ? ?

## 2022-03-20 NOTE — Discharge Instructions (Addendum)
Orthopedic discharge instructions: ?May shower with intact OpSite dressing. ?Apply ice frequently to knee or use Polar Care device. ?Start Eliquis 2.5 mg twice daily for 2 weeks as of Wednesday, 03/21/2022, then take aspirin 325 mg twice daily for 4 weeks. ?Take pain medication as prescribed or ES Tylenol when needed.  ?May weight-bear as tolerated - use walker for balance and support. ?Start home physical therapy as scheduled on Thursday. ?Follow-up in 10-14 days or as scheduled.POLAR CARE INFORMATION ? ?[email or web address].com/ ? ?How to use [place]?  YouTube   [email or web address].[email or web address] ? ?OPERATING INSTRUCTIONS ? ?Start the product ?With dry hands, connect the transformer to the electrical connection located on the top of the cooler. Next, plug the transformer into an appropriate electrical outlet. The unit will automatically start running at this point. ? ?To stop the pump, disconnect electrical power.  ?Unplug to stop the product when not in use. Unplugging the Polar Care unit turns it off. Always unplug immediately after use. Never leave it plugged in while unattended. Remove pad.  ?  ?FIRST ADD WATER TO FILL LINE, THEN ICE---Replace ice when existing ice is almost melted ? ?1 Discuss Treatment with your Licensed Health Care Practitioner and Use Only as Prescribed ?2 Apply Insulation Barrier & Cold Therapy Pad ?3 Check for Moisture ?4 Inspect Skin Regularly ? ?Tips and Armed forces technical officer Tips ?1. Use cubed or chunked ice for optimal performance. ?2. It is recommended to drain the Pad between uses. To drain the pad, hold the Pad upright with the hose pointed toward the ground. Depress the black plunger and allow water to drain out. ?3. You may disconnect the Pad from the unit without removing the pad from the affected area by depressing the silver tabs on the hose coupling and gently pulling the hoses apart. The Pad and unit will seal itself and will not leak. Note:  Some dripping during release is normal. ?4. DO NOT RUN PUMP WITHOUT WATER! The pump in this unit is designed to run with water. Running the unit without water will cause permanent damage to the pump. ?5. Unplug unit before removing lid. ? ?TROUBLESHOOTING GUIDE ?Pump not running, Water not flowing to the pad, Pad is not getting cold ?1. Make sure the transformer is plugged into the wall outlet. ?2. Confirm that the ice and water are filled to the indicated levels. ?3. Make sure there are no kinks in the pad. ?4. Gently pull on the blue tube to make sure the tube/pad junction is straight. ?5. Remove the pad from the treatment site and ll it while the pad is lying at; then reapply. ?6. Confirm that the pad couplings are securely attached to the unit. Listen for the double clicks (Figure 1) to confirm the pad couplings are securely attached. ? ?Leaks    Note: Some condensation on the lines, controller, and pads is unavoidable, especially in warmer climates. ?1. If using a [place] Polar Care Cold Therapy unit with a detachable Cold Therapy Pad, and a leak exists (other than condensation on the lines) disconnect the pad couplings. Make sure the silver tabs on the couplings are depressed before reconnecting the pad to the pump hose; then confirm both sides of the coupling are properly clicked in. ?2. If the coupling continues to leak or a leak is detected in the pad itself, stop using it and call [place] at [phone]. ? ?Cleaning ?After use, empty and dry the unit with a soft cloth. Warm  water and mild detergent may be used occasionally to clean the pump and tubes. ? ?WARNING: The [place] can be cold enough to cause serious injury, including full skin necrosis. Follow these Operating Instructions, and carefully read the Product Insert (see pouch on side of unit) and the Cold Therapy Pad Fitting Instructions (provided with each Cold Therapy Pad) prior to use. ? ? ? ? ? ?  AMBULATORY SURGERY  ?DISCHARGE  INSTRUCTIONS ? ? ?The drugs that you were given will stay in your system until tomorrow so for the next 24 hours you should not: ? ?Drive an automobile ?Make any legal decisions ?Drink any alcoholic beverage ? ? ?You may resume regular meals tomorrow.  Today it is better to start with liquids and gradually work up to solid foods. ? ?You may eat anything you prefer, but it is better to start with liquids, then soup and crackers, and gradually work up to solid foods. ? ? ?Please notify your doctor immediately if you have any unusual bleeding, trouble breathing, redness and pain at the surgery site, drainage, fever, or pain not relieved by medication. ? ? ? ?Additional Instructions: ? ? ? ? ? ? ? ?Please contact your physician with any problems or Same Day Surgery at [phone], Monday through Friday 6 am to 4 pm, or [place] at [facility] number at [phone].  ?

## 2022-03-20 NOTE — Anesthesia Postprocedure Evaluation (Signed)
Anesthesia Post Note ? ?Patient: [name] ? ?Procedure(s) Performed: TOTAL KNEE ARTHROPLASTY (Left: Knee) ? ?Patient location during evaluation: PACU ?Anesthesia Type: Spinal ?Level of consciousness: awake and alert, oriented and patient cooperative ?Pain management: pain level controlled ?Vital Signs Assessment: post-procedure vital signs reviewed and stable ?Respiratory status: spontaneous breathing, nonlabored ventilation and respiratory function stable ?Cardiovascular status: blood pressure returned to baseline and stable ?Postop Assessment: adequate PO intake, no headache and spinal receding ?Anesthetic complications: no ? ? ?No notable events documented. ? ? ?Last Vitals:  ?Vitals:  ? 03/20/22 1030 03/20/22 1045  ?BP: 133/83 109/69  ?Pulse: 83 83  ?Resp: (!) 34 (!) 26  ?Temp:  (!) 36.4 ?C  ?SpO2: 100% 99%  ?  ?Last Pain:  ?Vitals:  ? 03/20/22 1045  ?TempSrc:   ?PainSc: 0-No pain  ? ? ?LLE Motor Response: Purposeful movement (03/20/22 1045) ?LLE Sensation: Numbness (03/20/22 1045) ?RLE Motor Response: Purposeful movement (03/20/22 1045) ?RLE Sensation: Numbness (03/20/22 1045) ?  ?  ? ?Stephanie Shah ? ? ? ? ?

## 2022-03-20 NOTE — Anesthesia Preprocedure Evaluation (Addendum)
Anesthesia Evaluation  ?Patient identified by MRN, date of birth, ID band ?Patient awake ? ? ? ?Reviewed: ?Allergy & Precautions, NPO status , Patient's Chart, lab work & pertinent test results ? ?History of Anesthesia Complications ?Negative for: history of anesthetic complications ? ?Airway ?Mallampati: III ? ? ?Neck ROM: Full ? ? ? Dental ? ? ?Missing molars:   ?Pulmonary ?former smoker (quit 2005),  ?  ?Pulmonary exam normal ?breath sounds clear to auscultation ? ? ? ? ? ? Cardiovascular ?hypertension, Normal cardiovascular exam ?Rhythm:Regular Rate:Normal ? ?ECG 03/04/22: normal ? ?Myocardial perfusion 03/26/18:  ?1.??Normal left ventricular function  ?2.??Normal wall motion  ?3.??No evidence for scar or ischemia ? ?  ?Neuro/Psych ? Headaches,   ? GI/Hepatic ?negative GI ROS,   ?Endo/Other  ?Obesity  ? Renal/GU ?negative Renal ROS  ? ?  ?Musculoskeletal ? ?(+) Arthritis ,  ? Abdominal ?  ?Peds ? Hematology ? ?(+) Blood dyscrasia, anemia ,   ?Anesthesia Other Findings ? ? Reproductive/Obstetrics ? ?  ? ? ? ? ? ? ? ? ? ? ? ? ? ?  ?  ? ? ? ? ? ? ? ?Anesthesia Physical ?Anesthesia Plan ? ?ASA: 2 ? ?Anesthesia Plan: General and Spinal  ? ?Post-op Pain Management:   ? ?Induction: Intravenous ? ?PONV Risk Score and Plan: 3 and Propofol infusion, TIVA, Treatment may vary due to age or medical condition and Ondansetron ? ?Airway Management Planned: Natural Airway and Nasal Cannula ? ?Additional Equipment:  ? ?Intra-op Plan:  ? ?Post-operative Plan:  ? ?Informed Consent: I have reviewed the patients History and Physical, chart, labs and discussed the procedure including the risks, benefits and alternatives for the proposed anesthesia with the patient or authorized representative who has indicated his/her understanding and acceptance.  ? ? ? ? ? ?Plan Discussed with: CRNA ? ?Anesthesia Plan Comments: (Plan for spinal and GA with natural airway, LMA/GETA backup.  Patient consented for risks  of anesthesia including but not limited to:  ?- adverse reactions to medications ?- damage to eyes, teeth, lips or other oral mucosa ?- nerve damage due to positioning  ?- sore throat or hoarseness ?- headache, bleeding, infection, nerve damage 2/2 spinal ?- damage to heart, brain, nerves, lungs, other parts of body or loss of life ? ?Informed patient about role of CRNA in peri- and intra-operative care.  Patient voiced understanding.)  ? ? ? ? ? ? ?Anesthesia Quick Evaluation ? ?

## 2022-03-20 NOTE — Op Note (Signed)
03/20/2022 ? ?10:05 AM ? ?Patient:   Stephanie Shah ? ?Pre-Op Diagnosis:   Degenerative joint disease, left knee. ? ?Post-Op Diagnosis:   Same ? ?Procedure:   Left TKA using all-cemented [employer] Vanguard system with a 60 mm PCR femur, a 63 mm tibial tray with a 10 mm anterior stabilized E-poly insert, and a 31 x 6.2 mm all-poly 3-pegged domed patella. ? ?Surgeon:   Pascal Lux, MD ? ?Assistant:   Cameron Proud, PA-C  ? ?Anesthesia:   Spinal ? ?Findings:   As above ? ?Complications:   None ? ?EBL:   5 cc ? ?Fluids:   1000 cc crystalloid ? ?UOP:   Urinated unknown quantity in bed at conclusion of case ? ?TT:   80 minutes at 300 mmHg ? ?Drains:   None ? ?Closure:   Staples ? ?Implants:   As above ? ?Brief Clinical Note:   The patient is a 66 year old female with a long history of progressively worsening left knee pain. The patient's symptoms have progressed despite medications, activity modification, injections, etc. The patient's history and examination were consistent with advanced degenerative joint disease of the left knee confirmed by plain radiographs. The patient presents at this time for a left total knee arthroplasty. ? ?Procedure:   The patient was brought into the operating room. After adequate spinal anesthesia was obtained, the patient was lain in the supine position before the left lower extremity was prepped with ChloraPrep solution and draped sterilely. Preoperative antibiotics were administered. A timeout was performed to verify the appropriate surgical site before the limb was exsanguinated with an Esmarch and the tourniquet inflated to 300 mmHg.  ? ?A standard anterior approach to the knee was made through an approximately 7 inch incision. The incision was carried down through the subcutaneous tissues to expose superficial retinaculum. This was split the length of the incision and the medial flap elevated sufficiently to expose the medial retinaculum. The medial retinaculum was incised, leaving a 3-4  mm cuff of tissue on the patella. This was extended distally along the medial border of the patellar tendon and proximally through the medial third of the quadriceps tendon. A subtotal fat pad excision was performed before the soft tissues were elevated off the anteromedial and anterolateral aspects of the proximal tibia to the level of the collateral ligaments. The anterior portions of the medial and lateral menisci were removed, as was the anterior cruciate ligament. With the knee flexed to 90?, the external tibial guide was positioned and the appropriate proximal tibial cut made. This piece was taken to the back table where it was measured and found to be optimally replicated by a 63 mm component. ? ?Attention was directed to the distal femur. The intramedullary canal was accessed through a 3/8" drill hole. The intramedullary guide was inserted and positioned in order to obtain a neutral flexion gap. The intercondylar block was positioned with care taken to avoid notching the anterior cortex of the femur. The appropriate cut was made. Next, the distal cutting block was placed at 5? of valgus alignment. Using the 9 mm slot, the distal cut was made. The distal femur was measured and found to be optimally replicated by the 60 mm component. The 60 mm 4-in-1 cutting block was positioned and first the posterior, then the posterior chamfer, the anterior chamfer, and finally the anterior cuts were made. At this point, the posterior portions medial and lateral menisci were removed. A trial reduction was performed using the appropriate femoral and tibial  components with the 10 mm insert. This demonstrated excellent stability to varus and valgus stressing both in flexion and extension while permitting full extension. Patella tracking was assessed and found to be excellent. Therefore, the tibial guide position was marked on the proximal tibia. The patella thickness was measured and found to be 18 mm. Therefore, the  appropriate cut was made. The patellar surface was measured and found to be optimally replicated by the 31 mm component. The three peg holes were drilled in place before the trial button was inserted. Patella tracking was assessed and found to be excellent, passing the "no thumb test". The lug holes were drilled into the distal femur before the trial component was removed, leaving only the tibial tray. The keel was then created using the appropriate tower, reamer, and punch. ? ?The bony surfaces were prepared for cementing by irrigating them thoroughly with sterile saline solution via the jet lavage system. A bone plug was fashioned from some of the bone that had been removed previously and used to plug the distal femoral canal. In addition, 20 cc of Exparel diluted out to 60 cc with normal saline and 30 cc of 0.5% Sensorcaine were injected into the postero-medial and postero-lateral aspects of the knee, the medial and lateral gutter regions, and the peri-incisional tissues to help with postoperative analgesia. Meanwhile, the cement was being mixed on the back table. When it was ready, the tibial tray was cemented in first. The excess cement was removed using [employer] fast streamer. Next, the femoral component was impacted into place. Again, the excess cement was removed using [employer] fast streamer. The 10 mm trial insert was positioned and the knee brought into extension while the cement hardened. Finally, the patella was cemented into place and secured using the patellar clamp. Again, the excess cement was removed using [employer] fast streamer. Once the cement had hardened, the knee was placed through a range of motion with the findings as described above. Therefore, the trial insert was removed and, after verifying that no cement had been retained posteriorly, the permanent 10 mm anterior stabilized E-polyethylene insert was positioned and secured using the appropriate key locking mechanism. Again the knee was placed through a range  of motion with the findings as described above. ? ?The wound was copiously irrigated with sterile saline solution using the jet lavage system before the quadriceps tendon and retinacular layer were reapproximated using #0 Vicryl interrupted sutures. The superficial retinacular layer also was closed using a running #0 Vicryl suture. A total of 10 cc of transexemic acid (TXA) was injected intra-articularly before the subcutaneous tissues were closed in several layers using 2-0 Vicryl interrupted sutures. The skin was closed using staples. A sterile honeycomb dressing was applied to the skin before the leg was wrapped with an Ace wrap to accommodate the Polar Care device. The patient was then awakened and returned to the recovery room in satisfactory condition after tolerating the procedure well. ?

## 2022-03-20 NOTE — Transfer of Care (Signed)
Immediate Anesthesia Transfer of Care Note ? ?Patient: Stephanie Shah ? ?Procedure(s) Performed: TOTAL KNEE ARTHROPLASTY (Left: Knee) ? ?Patient Location: PACU ? ?Anesthesia Type:General ? ?Level of Consciousness: awake and alert  ? ?Airway & Oxygen Therapy: Patient Spontanous Breathing and Patient connected to nasal cannula oxygen ? ?Post-op Assessment: Report given to RN and Post -op Vital signs reviewed and stable ? ?Post vital signs: Reviewed and stable ? ?Last Vitals:  ?Vitals Value Taken Time  ?BP 88/61 03/20/22 1000  ?Temp    ?Pulse 88 03/20/22 1002  ?Resp 16 03/20/22 1002  ?SpO2 100 % 03/20/22 1002  ?Vitals shown include unvalidated device data. ? ?Last Pain:  ?Vitals:  ? 03/20/22 0615  ?TempSrc: Oral  ?PainSc: 3   ?   ? ?  ? ?Complications: No notable events documented. ?

## 2022-03-21 ENCOUNTER — Encounter: Payer: Self-pay | Admitting: Surgery

## 2022-03-30 ENCOUNTER — Encounter: Payer: Self-pay | Admitting: Family Medicine

## 2022-03-30 ENCOUNTER — Other Ambulatory Visit: Payer: Self-pay | Admitting: Family Medicine

## 2022-03-30 NOTE — Telephone Encounter (Signed)
Requested Prescriptions  Pending Prescriptions Disp Refills  . lisinopril (ZESTRIL) 40 MG tablet [Pharmacy Med Name: Lisinopril 40 MG Oral Tablet] 30 tablet 0    Sig: Take 1 tablet by mouth once daily     Cardiovascular:  ACE Inhibitors Failed - 03/30/2022  3:01 PM      Failed - Last BP in normal range    BP Readings from Last 1 Encounters:  03/20/22 (!) 158/91         Passed - Cr in normal range and within 180 days    Creatinine, Ser  Date Value Ref Range Status  03/06/2022 0.62 0.44 - 1.00 mg/dL Final         Passed - K in normal range and within 180 days    Potassium  Date Value Ref Range Status  03/06/2022 3.5 3.5 - 5.1 mmol/L Final         Passed - Patient is not pregnant      Passed - Valid encounter within last 6 months    Recent Outpatient Visits          2 weeks ago Prediabetes   [facility] Birdie Sons, MD   11 months ago Primary hypertension   [facility] Birdie Sons, MD   1 year ago Need for diphtheria-tetanus-pertussis (Tdap) vaccine   [facility] Birdie Sons, MD   3 years ago [place], Donald E, MD   3 years ago Upper respiratory tract infection, unspecified type   [facility] Birdie Sons, MD

## 2022-04-18 ENCOUNTER — Encounter: Payer: Self-pay | Admitting: Dermatology

## 2022-06-20 ENCOUNTER — Encounter: Payer: Self-pay | Admitting: Dermatology

## 2022-06-21 ENCOUNTER — Other Ambulatory Visit: Payer: Self-pay | Admitting: Family Medicine

## 2022-06-21 DIAGNOSIS — E785 Hyperlipidemia, unspecified: Secondary | ICD-10-CM

## 2022-08-29 ENCOUNTER — Ambulatory Visit (INDEPENDENT_AMBULATORY_CARE_PROVIDER_SITE_OTHER): Payer: Medicare Other | Admitting: Dermatology

## 2022-08-29 DIAGNOSIS — L304 Erythema intertrigo: Secondary | ICD-10-CM

## 2022-08-29 DIAGNOSIS — L719 Rosacea, unspecified: Secondary | ICD-10-CM | POA: Diagnosis not present

## 2022-08-29 DIAGNOSIS — R21 Rash and other nonspecific skin eruption: Secondary | ICD-10-CM

## 2022-08-29 DIAGNOSIS — L409 Psoriasis, unspecified: Secondary | ICD-10-CM

## 2022-08-29 MED ORDER — CLOBETASOL PROPIONATE 0.05 % EX SOLN: CUTANEOUS | 4 refills | Status: DC

## 2022-08-29 MED ORDER — KETOCONAZOLE 2 % EX CREA: 1.0000 | TOPICAL_CREAM | Freq: Two times a day (BID) | CUTANEOUS | 2 refills | Status: AC

## 2022-08-29 MED ORDER — HYDROCORTISONE 2.5 % EX CREA: TOPICAL_CREAM | Freq: Two times a day (BID) | CUTANEOUS | 2 refills | Status: DC | PRN

## 2022-08-29 NOTE — Progress Notes (Signed)
Follow-Up Visit   Subjective  Stephanie Shah is a 66 y.o. female who presents for the following: Annual Exam (No personal or fhx skin cancer. Patient with hx of AK's.), Psoriasis (At scalp and ears. Using clobetasol solution at scalp but flared and itching at face. Patient has seen rheumatology and is on MTX for psoriatic arthritis, she also has RA and osteoarthritis. Patient was taking Celebrex for knee replacement and as her dose is decreasing her psoriasis is flaring. ), and Rosacea (Patient currently using metronidazole 0.75% gel qd-qod. Well controlled.).  The patient presents for Total-Body Skin Exam (TBSE) for skin cancer screening and mole check.  The patient has spots, moles and lesions to be evaluated, some may be new or changing and the patient has concerns that these could be cancer.   The following portions of the chart were reviewed this encounter and updated as appropriate:   Tobacco  Allergies  Meds  Problems  Med Hx  Surg Hx  Fam Hx      Review of Systems:  No other skin or systemic complaints except as noted in HPI or Assessment and Plan.  Objective  Well appearing patient in no apparent distress; mood and affect are within normal limits.  A full examination was performed including scalp, head, eyes, ears, nose, lips, neck, chest, axillae, abdomen, back, buttocks, bilateral upper extremities, bilateral lower extremities, hands, feet, fingers, toes, fingernails, and toenails. All findings within normal limits unless otherwise noted below.  Scalp Well-demarcated erythematous papules/plaques with silvery scale, guttate pink scaly papules.  face Mid face erythema   inguinal folds, low abdomen Clear today  forehead Scaly pink plaques at ears    Assessment & Plan  Psoriasis Scalp  Chronic and persistent condition with duration or expected duration over one year. Condition is symptomatic/ bothersome to patient. Not currently at goal.  Psoriasis is a chronic  non-curable, but treatable genetic/hereditary disease that may have other systemic features affecting other organ systems such as joints (Psoriatic Arthritis). It is associated with an increased risk of inflammatory bowel disease, heart disease, non-alcoholic fatty liver disease, and depression.    Patient c/o joint pain. Taking MTX 10 mg qwk prescribed by Dr. Posey Pronto.  Continue clobetasol solution increasing to twice daily for 2 weeks then decrease to daily as needed for itch. Avoid applying to face, groin, and axilla. Use as directed. Long-term use can cause thinning of the skin.  Consider adding Zoryve.   Topical steroids (such as triamcinolone, fluocinolone, fluocinonide, mometasone, clobetasol, halobetasol, betamethasone, hydrocortisone) can cause thinning and lightening of the skin if they are used for too long in the same area. Your physician has selected the right strength medicine for your problem and area affected on the body. Please use your medication only as directed by your physician to prevent side effects.    Related Medications clobetasol (TEMOVATE) 0.05 % external solution Apply a small amount to scalp twice a day as needed.  Rosacea face  Chronic condition with duration or expected duration over one year. Currently well-controlled.  Rosacea is a chronic progressive skin condition usually affecting the face of adults, causing redness and/or acne bumps. It is treatable but not curable. It sometimes affects the eyes (ocular rosacea) as well. It may respond to topical and/or systemic medication and can flare with stress, sun exposure, alcohol, exercise, topical steroids (including hydrocortisone/cortisone 10) and some foods.  Daily application of broad spectrum spf 30+ sunscreen to face is recommended to reduce flares.  Continue metronidazole 0.75%  gel increasing to twice daily.   Related Medications Brimonidine Tartrate (MIRVASO) 0.33 % GEL Apply 1 application topically in the  morning.  Erythema intertrigo inguinal folds, low abdomen  Intertrigo is a chronic recurrent rash that occurs in skin fold areas that may be associated with friction; heat; moisture; yeast; fungus; and bacteria.  It is exacerbated by increased movement / activity; sweating; and higher atmospheric temperature.  Recommend daily powder for prevention  Continue ketoconazole cream twice a day as needed Continue hydrocortisone twice a day as needed up to 2 weeks. Topical steroids (such as triamcinolone, fluocinolone, fluocinonide, mometasone, clobetasol, halobetasol, betamethasone, hydrocortisone) can cause thinning and lightening of the skin if they are used for too long in the same area. Your physician has selected the right strength medicine for your problem and area affected on the body. Please use your medication only as directed by your physician to prevent side effects.     Rash forehead  Favor seb derm > psoriasis Start HC 2.5% cream twice daily for up to 1 week  as needed for rash.  Start ketoconazole 2% cream twice daily.   If not improving, consider Zoryve if scalp still flaring.  hydrocortisone 2.5 % cream - forehead Apply topically 2 (two) times daily as needed (Rash). For up to 1 week to affected areas of rash.  ketoconazole (NIZORAL) 2 % cream - forehead Apply 1 Application topically 2 (two) times daily.   Return for 3-8 weeks , Rosacea, AK follow up.  [name], RMA, am acting as scribe for Forest Gleason, MD .  Documentation: I have reviewed the above documentation for accuracy and completeness, and I agree with the above.  Forest Gleason, MD

## 2022-08-29 NOTE — Patient Instructions (Addendum)
Psoriasis -   Continue clobetasol solution increasing to twice daily for 2 weeks then decrease to daily as needed for itch. Avoid applying to face, groin, and axilla. Use as directed. Long-term use can cause thinning of the skin.  Topical steroids (such as triamcinolone, fluocinolone, fluocinonide, mometasone, clobetasol, halobetasol, betamethasone, hydrocortisone) can cause thinning and lightening of the skin if they are used for too long in the same area. Your physician has selected the right strength medicine for your problem and area affected on the body. Please use your medication only as directed by your physician to prevent side effects.   Rosacea is a chronic progressive skin condition usually affecting the face of adults, causing redness and/or acne bumps. It is treatable but not curable. It sometimes affects the eyes (ocular rosacea) as well. It may respond to topical and/or systemic medication and can flare with stress, sun exposure, alcohol, exercise, topical steroids (including hydrocortisone/cortisone 10) and some foods.  Daily application of broad spectrum spf 30+ sunscreen to face is recommended to reduce flares.  Continue metronidazole 0.75% gel to face increasing to twice daily.   Rash at forehead -  Start ketoconazole 2% cream twice daily.  Start HC 2.5% cream twice daily for up to 1 week  as needed for rash.   Melanoma ABCDEs  Melanoma is the most dangerous type of skin cancer, and is the leading cause of death from skin disease.  You are more likely to develop melanoma if you: Have light-colored skin, light-colored eyes, or red or blond hair Spend a lot of time in the sun Tan regularly, either outdoors or in a tanning bed Have had blistering sunburns, especially during childhood Have a close family member who has had a melanoma Have atypical moles or large birthmarks  Early detection of melanoma is key since treatment is typically straightforward and cure rates are  extremely high if we catch it early.   The first sign of melanoma is often a change in a mole or a new dark spot.  The ABCDE system is a way of remembering the signs of melanoma.  A for asymmetry:  The two halves do not match. B for border:  The edges of the growth are irregular. C for color:  A mixture of colors are present instead of an even brown color. D for diameter:  Melanomas are usually (but not always) greater than 49m - the size of a pencil eraser. E for evolution:  The spot keeps changing in size, shape, and color.  Please check your skin once per month between visits. You can use a small mirror in front and a large mirror behind you to keep an eye on the back side or your body.   If you see any new or changing lesions before your next follow-up, please call to schedule a visit.  Please continue daily skin protection including broad spectrum sunscreen SPF 30+ to sun-exposed areas, reapplying every 2 hours as needed when you're outdoors.    Due to recent changes in healthcare laws, you may see results of your pathology and/or laboratory studies on MyChart before the doctors have had a chance to review them. We understand that in some cases there may be results that are confusing or concerning to you. Please understand that not all results are received at the same time and often the doctors may need to interpret multiple results in order to provide you with the best plan of care or course of treatment. Therefore, we ask that  you please give [place] 2 business days to thoroughly review all your results before contacting the office for clarification. Should we see a critical lab result, you will be contacted sooner.   If You Need Anything After Your Visit  If you have any questions or concerns for your doctor, please call our main line at [phone] and press option 4 to reach your doctor's medical assistant. If no one answers, please leave a voicemail as directed and we will return your  call as soon as possible. Messages left after 4 pm will be answered the following business day.   You may also send [place] a message via [place]. We typically respond to MyChart messages within 1-2 business days.  For prescription refills, please ask your pharmacy to contact our office. Our fax number is [phone].  If you have an urgent issue when the clinic is closed that cannot wait until the next business day, you can page your doctor at the number below.    Please note that while we do our best to be available for urgent issues outside of office hours, we are not available 24/7.   If you have an urgent issue and are unable to reach [place], you may choose to seek medical care at your doctor's office, retail clinic, urgent care center, or emergency room.  If you have a medical emergency, please immediately call 911 or go to the emergency department.  Pager Numbers  - Dr. Nehemiah Massed: [phone]  - Dr. Laurence Ferrari: [phone]  - Dr. Nicole Kindred: [phone]  In the event of inclement weather, please call our main line at [phone] for an update on the status of any delays or closures.  Dermatology Medication Tips: Please keep the boxes that topical medications come in in order to help keep track of the instructions about where and how to use these. Pharmacies typically print the medication instructions only on the boxes and not directly on the medication tubes.   If your medication is too expensive, please contact our office at [phone] option 4 or send [place] a message through [place].   We are unable to tell what your co-pay for medications will be in advance as this is different depending on your insurance coverage. However, we may be able to find a substitute medication at lower cost or fill out paperwork to get insurance to cover a needed medication.   If a prior authorization is required to get your medication covered by your insurance company, please allow [place] 1-2 business days to  complete this process.  Drug prices often vary depending on where the prescription is filled and some pharmacies may offer cheaper prices.  The website www.goodrx.com contains coupons for medications through different pharmacies. The prices here do not account for what the cost may be with help from insurance (it may be cheaper with your insurance), but the website can give you the price if you did not use any insurance.  - You can print the associated coupon and take it with your prescription to the pharmacy.  - You may also stop by our office during regular business hours and pick up a GoodRx coupon card.  - If you need your prescription sent electronically to a different pharmacy, notify our office through [facility] or by phone at [phone] option 4.     Si Usted Necesita Algo Despus de Su Visita  Tambin puede enviarnos un mensaje a travs de Pharmacist, community. Por lo general respondemos a los mensajes de Therapist, occupational transcurso  de 1 a 2 das hbiles.  Para renovar recetas, por favor pida a su farmacia que se ponga en contacto con nuestra oficina. Harland Dingwall de fax es [place] [phone].  Si tiene un asunto urgente cuando la clnica est cerrada y que no puede esperar hasta el siguiente da hbil, puede llamar/localizar a su doctor(a) al nmero que aparece a continuacin.   Por favor, tenga en cuenta que aunque hacemos todo lo posible para estar disponibles para asuntos urgentes fuera del horario de [place], no estamos disponibles las 24 horas del da, los 7 das de la [place].   Si tiene un problema urgente y no puede comunicarse con nosotros, puede optar por buscar atencin mdica  en el consultorio de su doctor(a), en una clnica privada, en un centro de atencin urgente o en una sala de emergencias.  Si tiene Engineering geologist, por favor llame inmediatamente al 911 o vaya a la sala de emergencias.  Nmeros de bper  - Dr. Nehemiah Massed: [phone]  - Dra. Moye:  [phone]  - Dra. Nicole Kindred: [phone]  En caso de inclemencias del [place], por favor llame a Johnsie Kindred principal al [phone] para una actualizacin sobre el [place] de cualquier retraso o cierre.  Consejos para la medicacin en dermatologa: Por favor, guarde las cajas en las que vienen los medicamentos de uso tpico para ayudarle a seguir las instrucciones sobre dnde y cmo usarlos. Las farmacias generalmente imprimen las instrucciones del medicamento slo en las cajas y no directamente en los tubos del [place].   Si su medicamento es muy caro, por favor, pngase en contacto con Zigmund Daniel llamando al [phone] y presione la opcin 4 o envenos un mensaje a travs de Pharmacist, community.   No podemos decirle cul ser su copago por los medicamentos por adelantado ya que esto es diferente dependiendo de la cobertura de su seguro. Sin embargo, es posible que podamos encontrar un medicamento sustituto a Electrical engineer un formulario para que el seguro cubra el medicamento que se considera necesario.   Si se requiere una autorizacin previa para que su compaa de seguros [place] su medicamento, por favor permtanos de 1 a 2 das hbiles para completar este proceso.  Los precios de los medicamentos varan con frecuencia dependiendo del Environmental consultant de dnde se surte la receta y alguna farmacias pueden ofrecer precios ms baratos.  El sitio web www.goodrx.com tiene cupones para medicamentos de Airline pilot. Los precios aqu no tienen en cuenta lo que podra costar con la ayuda del seguro (puede ser ms barato con su seguro), pero el sitio web puede darle el precio si no utiliz Research scientist (physical sciences).  - Puede imprimir el cupn correspondiente y llevarlo con su receta a la farmacia.  - Tambin puede pasar por nuestra oficina durante el horario de atencin regular y Charity fundraiser una tarjeta de cupones de GoodRx.  - Si necesita que su receta se enve electrnicamente a una farmacia diferente,  informe a nuestra oficina a travs de MyChart de [place] o por telfono llamando al [phone] y presione la opcin 4.

## 2022-09-06 ENCOUNTER — Encounter: Payer: Self-pay | Admitting: Dermatology

## 2022-09-18 ENCOUNTER — Other Ambulatory Visit: Payer: Self-pay | Admitting: Family Medicine

## 2022-09-20 ENCOUNTER — Other Ambulatory Visit: Payer: Self-pay | Admitting: Internal Medicine

## 2022-09-20 DIAGNOSIS — Z1231 Encounter for screening mammogram for malignant neoplasm of breast: Secondary | ICD-10-CM

## 2022-10-03 ENCOUNTER — Ambulatory Visit
Admission: RE | Admit: 2022-10-03 | Discharge: 2022-10-03 | Disposition: A | Discharge status: Home / Self Care | Payer: Medicare Other | Source: Ambulatory Visit | Attending: Internal Medicine | Admitting: Internal Medicine

## 2022-10-03 DIAGNOSIS — Z1231 Encounter for screening mammogram for malignant neoplasm of breast: Secondary | ICD-10-CM | POA: Diagnosis present

## 2022-10-16 ENCOUNTER — Ambulatory Visit (INDEPENDENT_AMBULATORY_CARE_PROVIDER_SITE_OTHER): Payer: Medicare Other | Admitting: Dermatology

## 2022-10-16 ENCOUNTER — Encounter: Payer: Self-pay | Admitting: Dermatology

## 2022-10-16 VITALS — BP 141/87 | HR 102

## 2022-10-16 DIAGNOSIS — L689 Hypertrichosis, unspecified: Secondary | ICD-10-CM

## 2022-10-16 DIAGNOSIS — L409 Psoriasis, unspecified: Secondary | ICD-10-CM | POA: Diagnosis not present

## 2022-10-16 DIAGNOSIS — L719 Rosacea, unspecified: Secondary | ICD-10-CM | POA: Diagnosis not present

## 2022-10-16 NOTE — Patient Instructions (Signed)
Due to recent changes in healthcare laws, you may see results of your pathology and/or laboratory studies on MyChart before the doctors have had a chance to review them. We understand that in some cases there may be results that are confusing or concerning to you. Please understand that not all results are received at the same time and often the doctors may need to interpret multiple results in order to provide you with the best plan of care or course of treatment. Therefore, we ask that you please give us 2 business days to thoroughly review all your results before contacting the office for clarification. Should we see a critical lab result, you will be contacted sooner.   If You Need Anything After Your Visit  If you have any questions or concerns for your doctor, please call our main line at [phone] and press option 4 to reach your doctor's medical assistant. If no one answers, please leave a voicemail as directed and we will return your call as soon as possible. Messages left after 4 pm will be answered the following business day.   You may also send us a message via MyChart. We typically respond to MyChart messages within 1-2 business days.  For prescription refills, please ask your pharmacy to contact our office. Our fax number is [phone].  If you have an urgent issue when the clinic is closed that cannot wait until the next business day, you can page your doctor at the number below.    Please note that while we do our best to be available for urgent issues outside of office hours, we are not available 24/7.   If you have an urgent issue and are unable to reach us, you may choose to seek medical care at your doctor's office, retail clinic, urgent care center, or emergency room.  If you have a medical emergency, please immediately call 911 or go to the emergency department.  Pager Numbers  - Dr. Kowalski: [phone]  - Dr. Moye: [phone]  - Dr. Stewart:  [phone]  In the event of inclement weather, please call our main line at [phone] for an update on the status of any delays or closures.  Dermatology Medication Tips: Please keep the boxes that topical medications come in in order to help keep track of the instructions about where and how to use these. Pharmacies typically print the medication instructions only on the boxes and not directly on the medication tubes.   If your medication is too expensive, please contact our office at [phone] option 4 or send us a message through MyChart.   We are unable to tell what your co-pay for medications will be in advance as this is different depending on your insurance coverage. However, we may be able to find a substitute medication at lower cost or fill out paperwork to get insurance to cover a needed medication.   If a prior authorization is required to get your medication covered by your insurance company, please allow us 1-2 business days to complete this process.  Drug prices often vary depending on where the prescription is filled and some pharmacies may offer cheaper prices.  The website www.goodrx.com contains coupons for medications through different pharmacies. The prices here do not account for what the cost may be with help from insurance (it may be cheaper with your insurance), but the website can give you the price if you did not use any insurance.  - You can print the associated coupon and take it with   your prescription to the pharmacy.  - You may also stop by our office during regular business hours and pick up a GoodRx coupon card.  - If you need your prescription sent electronically to a different pharmacy, notify our office through [place] MyChart or by phone at [phone] option 4.     Si Usted Necesita Algo Despus de Su Visita  Tambin puede enviarnos un mensaje a travs de MyChart. Por lo general respondemos a los mensajes de MyChart en el transcurso de 1 a 2  das hbiles.  Para renovar recetas, por favor pida a su farmacia que se ponga en contacto con nuestra oficina. Nuestro nmero de fax es el [phone].  Si tiene un asunto urgente cuando la clnica est cerrada y que no puede esperar hasta el siguiente da hbil, puede llamar/localizar a su doctor(a) al nmero que aparece a continuacin.   Por favor, tenga en cuenta que aunque hacemos todo lo posible para estar disponibles para asuntos urgentes fuera del horario de oficina, no estamos disponibles las 24 horas del da, los 7 das de la semana.   Si tiene un problema urgente y no puede comunicarse con nosotros, puede optar por buscar atencin mdica  en el consultorio de su doctor(a), en una clnica privada, en un centro de atencin urgente o en una sala de emergencias.  Si tiene una emergencia mdica, por favor llame inmediatamente al 911 o vaya a la sala de emergencias.  Nmeros de bper  - Dr. Kowalski: [phone]  - Dra. Moye: [phone]  - Dra. Stewart: [phone]  En caso de inclemencias del tiempo, por favor llame a nuestra lnea principal al [phone] para una actualizacin sobre el [place] de cualquier retraso o cierre.  Consejos para la medicacin en dermatologa: Por favor, guarde las cajas en las que vienen los medicamentos de uso tpico para ayudarle a seguir las instrucciones sobre dnde y cmo usarlos. Las farmacias generalmente imprimen las instrucciones del medicamento slo en las cajas y no directamente en los tubos del medicamento.   Si su medicamento es muy caro, por favor, pngase en contacto con nuestra oficina llamando al [phone] y presione la opcin 4 o envenos un mensaje a travs de MyChart.   No podemos decirle cul ser su copago por los medicamentos por adelantado ya que esto es diferente dependiendo de la cobertura de su seguro. Sin embargo, es posible que podamos encontrar un medicamento sustituto a menor costo o llenar un formulario para que el  seguro cubra el medicamento que se considera necesario.   Si se requiere una autorizacin previa para que su compaa de seguros cubra su medicamento, por favor permtanos de 1 a 2 das hbiles para completar este proceso.  Los precios de los medicamentos varan con frecuencia dependiendo del lugar de dnde se surte la receta y alguna farmacias pueden ofrecer precios ms baratos.  El sitio web www.goodrx.com tiene cupones para medicamentos de diferentes farmacias. Los precios aqu no tienen en cuenta lo que podra costar con la ayuda del seguro (puede ser ms barato con su seguro), pero el sitio web puede darle el precio si no utiliz ningn seguro.  - Puede imprimir el cupn correspondiente y llevarlo con su receta a la farmacia.  - Tambin puede pasar por nuestra oficina durante el horario de atencin regular y recoger una tarjeta de cupones de GoodRx.  - Si necesita que su receta se enve electrnicamente a una farmacia diferente, informe a nuestra oficina a travs de MyChart de [place]   o por telfono llamando al [phone] y presione la opcin 4.  

## 2022-10-16 NOTE — Progress Notes (Unsigned)
   Follow-Up Visit   Subjective  Stephanie Shah is a 66 y.o. female who presents for the following: Rosacea (Patient here today for 6 week rosacea follow up. Currently using metronidazole 0.75% twice daily, improved. ), Psoriasis (At scalp, using clobetasol solution twice daily. Well controlled. ), Follow-up (Recheck spot at nose. ), and Rash (Rash follow up at forehead. 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 ).   The following portions of the chart were reviewed this encounter and updated as appropriate:   Tobacco  Allergies  Meds  Problems  Med Hx  Surg Hx  Fam Hx      Review of Systems:  No other skin or systemic complaints except as noted in HPI or Assessment and Plan.  Objective  Well appearing patient in no apparent distress; mood and affect are within normal limits.  A focused examination was performed including face, scalp. Relevant physical exam findings are noted in the Assessment and Plan.  face Dilated blood vessel at nose, mid face erythema  Scalp Well-demarcated erythematous papules/plaques with silvery scale, guttate pink scaly papules.    Assessment & Plan  Rosacea face  Rosacea is a chronic progressive skin condition usually affecting the face of adults, causing redness and/or acne bumps. It is treatable but not curable. It sometimes affects the eyes (ocular rosacea) as well. It may respond to topical and/or systemic medication and can flare with stress, sun exposure, alcohol, exercise, topical steroids (including hydrocortisone/cortisone 10) and some foods.  Daily application of broad spectrum spf 30+ sunscreen to face is recommended to reduce flares.  Continue metronidazole 0.75% twice daily  Chronic condition with duration or expected duration over one year. Currently well-controlled.    Related Medications Brimonidine Tartrate (MIRVASO) 0.33 % GEL Apply 1 application topically in the morning.  Psoriasis Scalp  Chronic and  persistent condition with duration or expected duration over one year. Condition is symptomatic/ bothersome to patient. Not currently at goal.   Psoriasis is a chronic non-curable, but treatable genetic/hereditary disease that may have other systemic features affecting other organ systems such as joints (Psoriatic Arthritis). It is associated with an increased risk of inflammatory bowel disease, heart disease, non-alcoholic fatty liver disease, and depression.    Start clobetasol solution twice a day to affected areas. Avoid applying to face, groin, and axilla. Use as directed. Long-term use can cause thinning of the skin.  Topical steroids (such as triamcinolone, fluocinolone, fluocinonide, mometasone, clobetasol, halobetasol, betamethasone, hydrocortisone) can cause thinning and lightening of the skin if they are used for too long in the same area. Your physician has selected the right strength medicine for your problem and area affected on the body. Please use your medication only as directed by your physician to prevent side effects.    Patient c/o joint pain. Taking MTX 10 mg qwk and Celebrex prescribed by Dr. Posey Pronto. F/u with Dr. Posey Pronto  Related Medications clobetasol (TEMOVATE) 0.05 % external solution Apply a small amount to scalp twice a day as needed.  Hypertrichosis upper lip  Suggest electric trimmer  Patient advised 5-6 BBL hair removal treatments recommended at $100 per treatment for lip and chin   Return in about 1 year (around 10/17/2023) for TBSE, Rosacea, Psoriasis.  [name], RMA, am acting as scribe for Forest Gleason, MD .  Documentation: I have reviewed the above documentation for accuracy and completeness, and I agree with the above.  Forest Gleason, MD

## 2022-10-17 ENCOUNTER — Encounter: Payer: Self-pay | Admitting: Dermatology

## 2022-12-15 ENCOUNTER — Other Ambulatory Visit: Payer: Self-pay | Admitting: Family Medicine

## 2022-12-17 NOTE — Telephone Encounter (Signed)
Unable to refill per protocol, another provider listed in chart, patient has another PCP. Will refuse.  Requested Prescriptions  Pending Prescriptions Disp Refills   FLUoxetine (PROZAC) 40 MG capsule [Pharmacy Med Name: FLUoxetine HCl 40 MG Oral Capsule] 90 capsule 0    Sig: TAKE 1 CAPSULE BY MOUTH ONCE DAILY (SCHEDULE  OFFICE  VISIT  FOR  FOLLOW  UP)     Psychiatry:  Antidepressants - SSRI Failed - 12/15/2022 10:24 AM      Failed - Valid encounter within last 6 months    Recent Outpatient Visits           9 months ago Prediabetes   [facility] Birdie Sons, MD   1 year ago Primary hypertension   [place], Donald E, MD   2 years ago Need for diphtheria-tetanus-pertussis (Tdap) vaccine   [facility] Birdie Sons, MD   3 years ago [place], Donald E, MD   3 years ago Upper respiratory tract infection, unspecified type   [facility] Birdie Sons, MD

## 2023-05-13 ENCOUNTER — Other Ambulatory Visit: Payer: Self-pay | Admitting: Family Medicine

## 2023-05-20 ENCOUNTER — Other Ambulatory Visit: Payer: Self-pay

## 2023-05-20 MED ORDER — METRONIDAZOLE 0.75 % EX CREA: TOPICAL_CREAM | Freq: Two times a day (BID) | CUTANEOUS | 6 refills | Status: DC

## 2023-05-20 NOTE — Progress Notes (Signed)
Patient came to office asking for refills. She is scheduled for f/u with Dr. Gwen Pounds.

## 2023-10-23 ENCOUNTER — Encounter: Payer: Medicare Other | Admitting: Dermatology

## 2023-10-23 ENCOUNTER — Ambulatory Visit (INDEPENDENT_AMBULATORY_CARE_PROVIDER_SITE_OTHER): Payer: Medicare Other | Admitting: Dermatology

## 2023-10-23 ENCOUNTER — Encounter: Payer: Self-pay | Admitting: Dermatology

## 2023-10-23 DIAGNOSIS — L4050 Arthropathic psoriasis, unspecified: Secondary | ICD-10-CM

## 2023-10-23 DIAGNOSIS — Z1283 Encounter for screening for malignant neoplasm of skin: Secondary | ICD-10-CM

## 2023-10-23 DIAGNOSIS — D1801 Hemangioma of skin and subcutaneous tissue: Secondary | ICD-10-CM

## 2023-10-23 DIAGNOSIS — W908XXA Exposure to other nonionizing radiation, initial encounter: Secondary | ICD-10-CM

## 2023-10-23 DIAGNOSIS — L219 Seborrheic dermatitis, unspecified: Secondary | ICD-10-CM

## 2023-10-23 DIAGNOSIS — D229 Melanocytic nevi, unspecified: Secondary | ICD-10-CM

## 2023-10-23 DIAGNOSIS — L578 Other skin changes due to chronic exposure to nonionizing radiation: Secondary | ICD-10-CM | POA: Diagnosis not present

## 2023-10-23 DIAGNOSIS — L409 Psoriasis, unspecified: Secondary | ICD-10-CM | POA: Diagnosis not present

## 2023-10-23 DIAGNOSIS — Z872 Personal history of diseases of the skin and subcutaneous tissue: Secondary | ICD-10-CM

## 2023-10-23 DIAGNOSIS — L719 Rosacea, unspecified: Secondary | ICD-10-CM

## 2023-10-23 DIAGNOSIS — L408 Other psoriasis: Secondary | ICD-10-CM

## 2023-10-23 DIAGNOSIS — Z79899 Other long term (current) drug therapy: Secondary | ICD-10-CM

## 2023-10-23 DIAGNOSIS — L304 Erythema intertrigo: Secondary | ICD-10-CM

## 2023-10-23 DIAGNOSIS — L814 Other melanin hyperpigmentation: Secondary | ICD-10-CM

## 2023-10-23 DIAGNOSIS — L821 Other seborrheic keratosis: Secondary | ICD-10-CM

## 2023-10-23 DIAGNOSIS — Z7189 Other specified counseling: Secondary | ICD-10-CM

## 2023-10-23 DIAGNOSIS — R21 Rash and other nonspecific skin eruption: Secondary | ICD-10-CM

## 2023-10-23 MED ORDER — METRONIDAZOLE 0.75 % EX CREA: TOPICAL_CREAM | CUTANEOUS | 11 refills | Status: DC

## 2023-10-23 MED ORDER — CLOBETASOL PROPIONATE 0.05 % EX SOLN: CUTANEOUS | 11 refills | Status: DC

## 2023-10-23 MED ORDER — KETOCONAZOLE 2 % EX CREA: 1.0000 | TOPICAL_CREAM | CUTANEOUS | 11 refills | Status: DC

## 2023-10-23 MED ORDER — HYDROCORTISONE 2.5 % EX CREA: TOPICAL_CREAM | CUTANEOUS | 11 refills | Status: DC

## 2023-10-23 NOTE — Progress Notes (Signed)
Follow-Up Visit   Subjective  Stephanie Shah is a 67 y.o. female who presents for the following: Skin Cancer Screening and Full Body Skin Exam, hx of Aks, check brown spot chest, Rosacea face Metronidazole prn, Psoriasis scalp, ears clobetasol sol scalp, ears, seb derm face ketoconazole 2% cr prn, HC 2.5% cr prn  The patient presents for Total-Body Skin Exam (TBSE) for skin cancer screening and mole check. The patient has spots, moles and lesions to be evaluated, some may be new or changing and the patient may have concern these could be cancer.    The following portions of the chart were reviewed this encounter and updated as appropriate: medications, allergies, medical history  Review of Systems:  No other skin or systemic complaints except as noted in HPI or Assessment and Plan.  Objective  Well appearing patient in no apparent distress; mood and affect are within normal limits.  A full examination was performed including scalp, head, eyes, ears, nose, lips, neck, chest, axillae, abdomen, back, buttocks, bilateral upper extremities, bilateral lower extremities, hands, feet, fingers, toes, fingernails, and toenails. All findings within normal limits unless otherwise noted below.   Relevant physical exam findings are noted in the Assessment and Plan.     Assessment & Plan   SKIN CANCER SCREENING PERFORMED TODAY.  ACTINIC DAMAGE - Chronic condition, secondary to cumulative UV/sun exposure - diffuse scaly erythematous macules with underlying dyspigmentation - Recommend daily broad spectrum sunscreen SPF 30+ to sun-exposed areas, reapply every 2 hours as needed.  - Staying in the shade or wearing long sleeves, sun glasses (UVA+UVB protection) and wide brim hats (4-inch brim around the entire circumference of the hat) are also recommended for sun protection.  - Call for new or changing lesions.  LENTIGINES, SEBORRHEIC KERATOSES, HEMANGIOMAS - Benign normal skin lesions -  Benign-appearing - Call for any changes  MELANOCYTIC NEVI - Tan-brown and/or pink-flesh-colored symmetric macules and papules - Benign appearing on exam today - Observation - Call clinic for new or changing moles - Recommend daily use of broad spectrum spf 30+ sunscreen to sun-exposed areas.   ROSACEA face Exam Mid face erythema with telangiectasias   Chronic and persistent condition with duration or expected duration over one year. Condition is symptomatic / bothersome to patient. Not to goal.   Rosacea is a chronic progressive skin condition usually affecting the face of adults, causing redness and/or acne bumps. It is treatable but not curable. It sometimes affects the eyes (ocular rosacea) as well. It may respond to topical and/or systemic medication and can flare with stress, sun exposure, alcohol, exercise, topical steroids (including hydrocortisone/cortisone 10) and some foods.  Daily application of broad spectrum spf 30+ sunscreen to face is recommended to reduce flares.  Patient denies grittiness of the eyes  Treatment Plan Cont Metronidazole 0.75% cr qhs to face  Long term medication management.  Patient is using long term (months to years) prescription medication  to control their dermatologic condition.  These medications require periodic monitoring to evaluate for efficacy and side effects and may require periodic laboratory monitoring.   SEBORRHEIC DERMATITIS / SEBO PSORIASIS face Exam: mild erythema face  Chronic and persistent condition with duration or expected duration over one year. Condition is symptomatic/ bothersome to patient. Not currently at goal.   Seborrheic Dermatitis is a chronic persistent rash characterized by pinkness and scaling most commonly of the mid face but also can occur on the scalp (dandruff), ears; mid chest, mid back and groin.  It tends  to be exacerbated by stress and cooler weather.  People who have neurologic disease may experience new  onset or exacerbation of existing seborrheic dermatitis.  The condition is not curable but treatable and can be controlled.  Treatment Plan: Cont Ketoconazole 2% cream 3d/wk Monday, Wednesday, Friday Cont HC 2.5% cream 3d/wk, Tuesday, Thursday, Saturday    Topical steroids (such as triamcinolone, fluocinolone, fluocinonide, mometasone, clobetasol, halobetasol, betamethasone, hydrocortisone) can cause thinning and lightening of the skin if they are used for too long in the same area. Your physician has selected the right strength medicine for your problem and area affected on the body. Please use your medication only as directed by your physician to prevent side effects.   Long term medication management.  Patient is using long term (months to years) prescription medication  to control their dermatologic condition.  These medications require periodic monitoring to evaluate for efficacy and side effects and may require periodic laboratory monitoring.   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  Chronic condition with duration or expected duration over one year. Currently well-controlled.   patient has joint pain  Psoriasis is a chronic non-curable, but treatable genetic/hereditary disease that may have other systemic features affecting other organ systems such as joints (Psoriatic Arthritis). It is associated with an increased risk of inflammatory bowel disease, heart disease, non-alcoholic fatty liver disease, and depression.  Treatments include light and laser treatments; topical medications; and systemic medications including oral and injectables.  Treatment Plan: Cont MTX as prescribed by Rheumatology Cont Clobetasol sol qd up to 5d/wk aa scalp, ears, avoid f/g/a  Topical steroids (such as triamcinolone, fluocinolone, fluocinonide, mometasone, clobetasol, halobetasol, betamethasone, hydrocortisone) can cause thinning and lightening of the skin if  they are used for too long in the same area. Your physician has selected the right strength medicine for your problem and area affected on the body. Please use your medication only as directed by your physician to prevent side effects.   Long term medication management.  Patient is using long term (months to years) prescription medication  to control their dermatologic condition.  These medications require periodic monitoring to evaluate for efficacy and side effects and may require periodic laboratory monitoring.  INTERTRIGO groin Exam: Mild erythema groin  Chronic and persistent condition with duration or expected duration over one year. Condition is bothersome/symptomatic for patient. Currently flared.   Intertrigo is a chronic recurrent rash that occurs in skin fold areas that may be associated with friction; heat; moisture; yeast; fungus; and bacteria.  It is exacerbated by increased movement / activity; sweating; and higher atmospheric temperature.  Use of an absorbant powder such as Zeasorb AF powder or other OTC antifungal powder to the area daily can prevent rash recurrence. Other options to help keep the area dry include blow drying the area after bathing or using antiperspirant products such as Duradry sweat minimizing gel.  Treatment Plan: Start Ketoconazole 2% cream 3d/wk Monday, Wednesday, Friday Start HC 2.5% cream 3d/wk, Tuesday, Thursday, Saturday    HISTORY OF PRECANCEROUS ACTINIC KERATOSIS - site(s) of PreCancerous Actinic Keratosis clear today. - these may recur and new lesions may form requiring treatment to prevent transformation into skin cancer - observe for new or changing spots and contact [facility] for appointment if occur - photoprotection with sun protective clothing; sunglasses and broad spectrum sunscreen with SPF of at least 30 + and frequent self skin exams recommended - yearly exams by a dermatologist recommended  for persons with history of  PreCancerous Actinic Keratoses   PSORIASIS   Related Medications clobetasol (TEMOVATE) 0.05 % external solution Apply a small amount to scalp and ears qd up to 5 days a week, avoid face, groin, axilla RASH   Related Medications hydrocortisone 2.5 % cream Apply topically as directed. Apply to aa seborrheic dermatitis face and intertrigo groin 3 nights a week Tuesday, Thursday, Saturday  Return in about 1 year (around 10/22/2024) for TBSE, Hx of AKs.  I, Ardis Rowan, RMA, am acting as scribe for Armida Sans, MD .   Documentation: I have reviewed the above documentation for accuracy and completeness, and I agree with the above.  Armida Sans, MD

## 2023-10-23 NOTE — Patient Instructions (Addendum)
If scaly flare on face eyebrows, around nose and behind ears it is seborrheic dermatitis and you will use Ketoconazole 2% cream Monday, Wednesday, Friday at bedtime and Hydrocortisone 2.5% cream Tuesday, Thursday, Saturday at bedtime  If pimple flare on face mostly nose and cheeks it is Rosacea, use Metronidazole cream nightly at bedtime  Intertrigo groin Start Ketoconazole 2% cream 3d/wk Monday, Wednesday, Friday Start HC 2.5% cream 3d/wk, Tuesday, Thursday, Saturday    Due to recent changes in healthcare laws, you may see results of your pathology and/or laboratory studies on MyChart before the doctors have had a chance to review them. We understand that in some cases there may be results that are confusing or concerning to you. Please understand that not all results are received at the same time and often the doctors may need to interpret multiple results in order to provide you with the best plan of care or course of treatment. Therefore, we ask that you please give [place] 2 business days to thoroughly review all your results before contacting the office for clarification. Should we see a critical lab result, you will be contacted sooner.   If You Need Anything After Your Visit  If you have any questions or concerns for your doctor, please call our main line at [phone] and press option 4 to reach your doctor's medical assistant. If no one answers, please leave a voicemail as directed and we will return your call as soon as possible. Messages left after 4 pm will be answered the following business day.   You may also send [place] a message via MyChart. We typically respond to MyChart messages within 1-2 business days.  For prescription refills, please ask your pharmacy to contact our office. Our fax number is [phone].  If you have an urgent issue when the clinic is closed that cannot wait until the next business day, you can page your doctor at the number below.    Please note that while we  do our best to be available for urgent issues outside of office hours, we are not available 24/7.   If you have an urgent issue and are unable to reach [place], you may choose to seek medical care at your doctor's office, retail clinic, urgent care center, or emergency room.  If you have a medical emergency, please immediately call 911 or go to the emergency department.  Pager Numbers  - Dr. Gwen Pounds: [phone]  - Dr. Roseanne Reno: [phone]  - Dr. Katrinka Blazing: [phone]   In the event of inclement weather, please call our main line at [phone] for an update on the status of any delays or closures.  Dermatology Medication Tips: Please keep the boxes that topical medications come in in order to help keep track of the instructions about where and how to use these. Pharmacies typically print the medication instructions only on the boxes and not directly on the medication tubes.   If your medication is too expensive, please contact our office at [phone] option 4 or send [place] a message through MyChart.   We are unable to tell what your co-pay for medications will be in advance as this is different depending on your insurance coverage. However, we may be able to find a substitute medication at lower cost or fill out paperwork to get insurance to cover a needed medication.   If a prior authorization is required to get your medication covered by your insurance company, please allow [place] 1-2 business days to complete this process.  Drug  prices often vary depending on where the prescription is filled and some pharmacies may offer cheaper prices.  The website www.goodrx.com contains coupons for medications through different pharmacies. The prices here do not account for what the cost may be with help from insurance (it may be cheaper with your insurance), but the website can give you the price if you did not use any insurance.  - You can print the associated coupon and take it with your prescription  to the pharmacy.  - You may also stop by our office during regular business hours and pick up a GoodRx coupon card.  - If you need your prescription sent electronically to a different pharmacy, notify our office through [facility] or by phone at [phone] option 4.     Si Usted Necesita Algo Despus de Su Visita  Tambin puede enviarnos un mensaje a travs de Clinical cytogeneticist. Por lo general respondemos a los mensajes de MyChart en el transcurso de 1 a 2 das hbiles.  Para renovar recetas, por favor pida a su farmacia que se ponga en contacto con nuestra oficina. Annie Sable de fax es [place] [phone].  Si tiene un asunto urgente cuando la clnica est cerrada y que no puede esperar hasta el siguiente da hbil, puede llamar/localizar a su doctor(a) al nmero que aparece a continuacin.   Por favor, tenga en cuenta que aunque hacemos todo lo posible para estar disponibles para asuntos urgentes fuera del horario de [place], no estamos disponibles las 24 horas del da, los 7 [address]  [address] de la [place].   Si tiene un problema urgente y no puede comunicarse con nosotros, puede optar por buscar atencin mdica  en el consultorio de su doctor(a), en una clnica privada, en un centro de atencin urgente o en una sala de emergencias.  Si tiene Engineer, drilling, por favor llame inmediatamente al 911 o vaya a la sala de emergencias.  Nmeros de bper  - Dr. Gwen Pounds: [phone]  - Dra. Roseanne Reno: [phone]  - Dr. Katrinka Blazing: [phone]   En caso de inclemencias del tiempo, por favor llame a Lacy Duverney principal al [phone] para una actualizacin sobre el [place] de cualquier retraso o cierre.  Consejos para la medicacin en dermatologa: Por favor, guarde las cajas en las que vienen los medicamentos de uso tpico para ayudarle a seguir las instrucciones sobre dnde y cmo usarlos. Las farmacias generalmente imprimen las instrucciones del medicamento slo en las cajas y no directamente  en los tubos del [place].   Si su medicamento es muy caro, por favor, pngase en contacto con Rolm Gala llamando al [phone] y presione la opcin 4 o envenos un mensaje a travs de Clinical cytogeneticist.   No podemos decirle cul ser su copago por los medicamentos por adelantado ya que esto es diferente dependiendo de la cobertura de su seguro. Sin embargo, es posible que podamos encontrar un medicamento sustituto a Audiological scientist un formulario para que el seguro cubra el medicamento que se considera necesario.   Si se requiere una autorizacin previa para que su compaa de seguros [place] su medicamento, por favor permtanos de 1 a 2 das hbiles para completar [address]  Los precios de los medicamentos varan con frecuencia dependiendo del Environmental consultant de dnde se surte la receta y alguna farmacias pueden ofrecer precios ms baratos.  El sitio web www.goodrx.com tiene cupones para medicamentos de Health and safety inspector. Los precios aqu no tienen en cuenta lo que podra costar con la ayuda del seguro (puede ser ms  barato con su seguro), pero el sitio web puede darle el precio si no Visual merchandiser.  - Puede imprimir el cupn correspondiente y llevarlo con su receta a la farmacia.  - Tambin puede pasar por nuestra oficina durante el horario de atencin regular y Education officer, museum una tarjeta de cupones de GoodRx.  - Si necesita que su receta se enve electrnicamente a una farmacia diferente, informe a nuestra oficina a travs de MyChart de [place] o por telfono llamando al [phone] y presione la opcin 4.

## 2023-11-18 ENCOUNTER — Other Ambulatory Visit: Payer: Self-pay | Admitting: Internal Medicine

## 2023-11-18 DIAGNOSIS — Z1231 Encounter for screening mammogram for malignant neoplasm of breast: Secondary | ICD-10-CM

## 2023-12-10 ENCOUNTER — Ambulatory Visit
Admission: RE | Admit: 2023-12-10 | Discharge: 2023-12-10 | Disposition: A | Discharge status: Home / Self Care | Payer: Medicare Other | Source: Ambulatory Visit | Attending: Internal Medicine | Admitting: Internal Medicine

## 2023-12-10 DIAGNOSIS — Z1231 Encounter for screening mammogram for malignant neoplasm of breast: Secondary | ICD-10-CM | POA: Insufficient documentation

## 2024-02-25 ENCOUNTER — Other Ambulatory Visit: Payer: Self-pay | Admitting: Surgery

## 2024-02-26 ENCOUNTER — Other Ambulatory Visit: Payer: Self-pay

## 2024-02-26 ENCOUNTER — Encounter
Admission: RE | Admit: 2024-02-26 | Discharge: 2024-02-26 | Disposition: A | Discharge status: Home / Self Care | Source: Ambulatory Visit | Attending: Surgery | Admitting: Surgery

## 2024-02-26 ENCOUNTER — Inpatient Hospital Stay: Admission: RE | Admit: 2024-02-26 | Source: Ambulatory Visit

## 2024-02-26 VITALS — HR 82 | Resp 16 | Ht 61.0 in | Wt 195.1 lb

## 2024-02-26 DIAGNOSIS — Z01818 Encounter for other preprocedural examination: Secondary | ICD-10-CM | POA: Insufficient documentation

## 2024-02-26 DIAGNOSIS — Z01812 Encounter for preprocedural laboratory examination: Secondary | ICD-10-CM | POA: Diagnosis not present

## 2024-02-26 HISTORY — DX: Prediabetes: R73.03

## 2024-02-26 HISTORY — DX: Unilateral primary osteoarthritis, right knee: M17.11

## 2024-02-26 LAB — CBC WITH DIFFERENTIAL/PLATELET
Abs Immature Granulocytes: 0.08 10*3/uL — ABNORMAL HIGH (ref 0.00–0.07)
Basophils Absolute: 0.1 10*3/uL (ref 0.0–0.1)
Basophils Relative: 1 %
Eosinophils Absolute: 0.3 10*3/uL (ref 0.0–0.5)
Eosinophils Relative: 3 %
HCT: 44.2 % (ref 36.0–46.0)
Hemoglobin: 14.8 g/dL (ref 12.0–15.0)
Immature Granulocytes: 1 %
Lymphocytes Relative: 26 %
Lymphs Abs: 2.5 10*3/uL (ref 0.7–4.0)
MCH: 31.8 pg (ref 26.0–34.0)
MCHC: 33.5 g/dL (ref 30.0–36.0)
MCV: 94.8 fL (ref 80.0–100.0)
Monocytes Absolute: 1.1 10*3/uL — ABNORMAL HIGH (ref 0.1–1.0)
Monocytes Relative: 11 %
Neutro Abs: 5.9 10*3/uL (ref 1.7–7.7)
Neutrophils Relative %: 58 %
Platelets: 374 10*3/uL (ref 150–400)
RBC: 4.66 MIL/uL (ref 3.87–5.11)
RDW: 14.6 % (ref 11.5–15.5)
WBC: 9.9 10*3/uL (ref 4.0–10.5)
nRBC: 0 % (ref 0.0–0.2)

## 2024-02-26 LAB — URINALYSIS, ROUTINE W REFLEX MICROSCOPIC
Bilirubin Urine: NEGATIVE
Glucose, UA: NEGATIVE mg/dL
Hgb urine dipstick: NEGATIVE
Ketones, ur: NEGATIVE mg/dL
Leukocytes,Ua: NEGATIVE
Nitrite: NEGATIVE
Protein, ur: NEGATIVE mg/dL
Specific Gravity, Urine: 1.018 (ref 1.005–1.030)
pH: 6 (ref 5.0–8.0)

## 2024-02-26 LAB — COMPREHENSIVE METABOLIC PANEL WITH GFR
ALT: 40 U/L (ref 0–44)
AST: 42 U/L — ABNORMAL HIGH (ref 15–41)
Albumin: 4.1 g/dL (ref 3.5–5.0)
Alkaline Phosphatase: 71 U/L (ref 38–126)
Anion gap: 7 (ref 5–15)
BUN: 22 mg/dL (ref 8–23)
CO2: 27 mmol/L (ref 22–32)
Calcium: 9.4 mg/dL (ref 8.9–10.3)
Chloride: 102 mmol/L (ref 98–111)
Creatinine, Ser: 0.58 mg/dL (ref 0.44–1.00)
GFR, Estimated: >60 mL/min (ref >60)
Glucose, Bld: 79 mg/dL (ref 70–99)
Potassium: 3.5 mmol/L (ref 3.5–5.1)
Sodium: 136 mmol/L (ref 135–145)
Total Bilirubin: 0.7 mg/dL (ref 0.0–1.2)
Total Protein: 8 g/dL (ref 6.5–8.1)

## 2024-02-26 LAB — SURGICAL PCR SCREEN
MRSA, PCR: NEGATIVE
Staphylococcus aureus: NEGATIVE

## 2024-02-26 NOTE — Patient Instructions (Addendum)
 Your procedure is scheduled on: 03/10/24 - Tuesday Report to the Registration Desk on the 1st floor of the [facility]. To find out your arrival time, please call [phone] between 1PM - 3PM on: 03/09/24 - Monday If your arrival time is 6:00 am, do not arrive before that time as the [facility] entrance doors do not open until 6:00 am.  REMEMBER: Instructions that are not followed completely may result in serious medical risk, up to and including death; or upon the discretion of your surgeon and anesthesiologist your surgery may need to be rescheduled.  Do not eat food after midnight the night before surgery.  No gum chewing or hard candies.  You may however, drink CLEAR liquids up to 2 hours before you are scheduled to arrive for your surgery. Do not drink anything within 2 hours of your scheduled arrival time.  Clear liquids include: - water  - apple juice without pulp - gatorade (not RED colors) - black coffee or tea (Do NOT add milk or creamers to the coffee or tea) Do NOT drink anything that is not on this list.  In addition, your doctor has ordered for you to drink the provided:  Ensure Pre-Surgery Clear Carbohydrate Drink  Drinking this carbohydrate drink up to two hours before surgery helps to reduce insulin resistance and improve patient outcomes. Please complete drinking 2 hours before scheduled arrival time.  One week prior to surgery: Stop beginning 04/22, Anti-inflammatories (NSAIDS) such as Advil, Aleve, Ibuprofen, Motrin, Naproxen, Naprosyn and Aspirin based products such as Excedrin, Goody's Powder, BC Powder. You may take Tylenol if needed for pain up until the day of surgery.  Stop beginning 04/22 ANY OVER THE COUNTER supplements until after surgery : folic acid (FOLVITE)   HOLD methotrexate (RHEUMATREX) 1 week before surgery, resume with doctors orders.  HOLD lisinopril-hydrochlorothiazide on the day of surgery.   ON THE DAY OF SURGERY ONLY TAKE THESE  MEDICATIONS WITH SIPS OF WATER:  amLODipine (NORVASC)  atorvastatin (LIPITOR)  celecoxib (CELEBREX ) FLUoxetine (PROZAC)   No Alcohol for 24 hours before or after surgery.  No Smoking including e-cigarettes for 24 hours before surgery.  No chewable tobacco products for at least 6 hours before surgery.  No nicotine patches on the day of surgery.  Do not use any "recreational" drugs for at least a week (preferably 2 weeks) before your surgery.  Please be advised that the combination of cocaine and anesthesia may have negative outcomes, up to and including death. If you test positive for cocaine, your surgery will be cancelled.  On the morning of surgery brush your teeth with toothpaste and water, you may rinse your mouth with mouthwash if you wish. Do not swallow any toothpaste or mouthwash.  Use CHG Soap or wipes as directed on instruction sheet.  Do not wear jewelry, make-up, hairpins, clips or nail polish.  For welded (permanent) jewelry: bracelets, anklets, waist bands, etc.  Please have this removed prior to surgery.  If it is not removed, there is a chance that hospital personnel will need to cut it off on the day of surgery.  Do not wear lotions, powders, or perfumes.   Do not shave body hair from the neck down 48 hours before surgery.  Contact lenses, hearing aids and dentures may not be worn into surgery.  Do not bring valuables to the hospital. [facility] is not responsible for any missing/lost belongings or valuables.   Notify your doctor if there is any change in  your medical condition (cold, fever, infection).  Wear comfortable clothing (specific to your surgery type) to the hospital.  After surgery, you can help prevent lung complications by doing breathing exercises.  Take deep breaths and cough every 1-2 hours. Your doctor may order a device called an Incentive Spirometer to help you take deep breaths.  When coughing or sneezing, hold a pillow firmly against  your incision with both hands. This is called "splinting." Doing this helps protect your incision. It also decreases belly discomfort.  If you are being admitted to the hospital overnight, leave your suitcase in the car. After surgery it may be brought to your room.  In case of increased patient census, it may be necessary for you, the patient, to continue your postoperative care in the Same Day Surgery department.  If you are being discharged the day of surgery, you will not be allowed to drive home. You will need a responsible individual to drive you home and stay with you for 24 hours after surgery.   If you are taking public transportation, you will need to have a responsible individual with you.  Please call the Pre-admissions Testing Dept. at [phone] if you have any questions about these instructions.  Surgery Visitation Policy:  Patients having surgery or a procedure may have two visitors.  Children under the age of 5 must have an adult with them who is not the patient.  Inpatient Visitation:    Visiting hours are 7 a.m. to 8 p.m. Up to four visitors are allowed at one time in a patient room. The visitors may rotate out with other people during the day.  One visitor age 69 or older may stay with the patient overnight and must be in the room by 8 p.m.    Pre-operative 5 CHG Bath Instructions   You can play a key role in reducing the risk of infection after surgery. Your skin needs to be as free of germs as possible. You can reduce the number of germs on your skin by washing with CHG (chlorhexidine gluconate) soap before surgery. CHG is an antiseptic soap that kills germs and continues to kill germs even after washing.   DO NOT use if you have an allergy to chlorhexidine/CHG or antibacterial soaps. If your skin becomes reddened or irritated, stop using the CHG and notify one of our RNs at [phone].   Please shower with the CHG soap starting 4 days before surgery using  the following schedule: 04/25 - 04/29.   Please keep in mind the following:  DO NOT shave, including legs and underarms, starting the day of your first shower.   You may shave your face at any point before/day of surgery.  Place clean sheets on your bed the day you start using CHG soap. Use a clean washcloth (not used since being washed) for each shower. DO NOT sleep with pets once you start using the CHG.   CHG Shower Instructions:  If you choose to wash your hair and private area, wash first with your normal shampoo/soap.  After you use shampoo/soap, rinse your hair and body thoroughly to remove shampoo/soap residue.  Turn the water OFF and apply about 3 tablespoons (45 ml) of CHG soap to a CLEAN washcloth.  Apply CHG soap ONLY FROM YOUR NECK DOWN TO YOUR TOES (washing for 3-5 minutes)  DO NOT use CHG soap on face, private areas, open wounds, or sores.  Pay special attention to the area where your surgery is being  performed.  If you are having back surgery, having someone wash your back for you may be helpful. Wait 2 minutes after CHG soap is applied, then you may rinse off the CHG soap.  Pat dry with a clean towel  Put on clean clothes/pajamas   If you choose to wear lotion, please use ONLY the CHG-compatible lotions on the back of this paper.     Additional instructions for the day of surgery: DO NOT APPLY any lotions, deodorants, cologne, or perfumes.   Put on clean/comfortable clothes.  Brush your teeth.  Ask your nurse before applying any prescription medications to the skin.      CHG Compatible Lotions   Aveeno Moisturizing lotion  Cetaphil Moisturizing Cream  Cetaphil Moisturizing Lotion  Clairol Herbal Essence Moisturizing Lotion, Dry Skin  Clairol Herbal Essence Moisturizing Lotion, Extra Dry Skin  Clairol Herbal Essence Moisturizing Lotion, Normal Skin  Curel Age Defying Therapeutic Moisturizing Lotion with Alpha Hydroxy  Curel Extreme Care Body Lotion  Curel  Soothing Hands Moisturizing Hand Lotion  Curel Therapeutic Moisturizing Cream, Fragrance-Free  Curel Therapeutic Moisturizing Lotion, Fragrance-Free  Curel Therapeutic Moisturizing Lotion, Original Formula  Eucerin Daily Replenishing Lotion  Eucerin Dry Skin Therapy Plus Alpha Hydroxy Crme  Eucerin Dry Skin Therapy Plus Alpha Hydroxy Lotion  Eucerin Original Crme  Eucerin Original Lotion  Eucerin Plus Crme Eucerin Plus Lotion  Eucerin TriLipid Replenishing Lotion  Keri Anti-Bacterial Hand Lotion  Keri Deep Conditioning Original Lotion Dry Skin Formula Softly Scented  Keri Deep Conditioning Original Lotion, Fragrance Free Sensitive Skin Formula  Keri Lotion Fast Absorbing Fragrance Free Sensitive Skin Formula  Keri Lotion Fast Absorbing Softly Scented Dry Skin Formula  Keri Original Lotion  Keri Skin Renewal Lotion Keri Silky Smooth Lotion  Keri Silky Smooth Sensitive Skin Lotion  Nivea Body Creamy Conditioning Oil  Nivea Body Extra Enriched Lotion  Nivea Body Original Lotion  Nivea Body Sheer Moisturizing Lotion Nivea Crme  Nivea Skin Firming Lotion  NutraDerm 30 Skin Lotion  NutraDerm Skin Lotion  NutraDerm Therapeutic Skin Cream  NutraDerm Therapeutic Skin Lotion  ProShield Protective Hand Cream  Provon moisturizing lotion  How to Use an Incentive Spirometer  An incentive spirometer is a tool that measures how well you are filling your lungs with each breath. Learning to take long, deep breaths using this tool can help you keep your lungs clear and active. This may help to reverse or lessen your chance of developing breathing (pulmonary) problems, especially infection. You may be asked to use a spirometer: After a surgery. If you have a lung problem or a history of smoking. After a long period of time when you have been unable to move or be active. If the spirometer includes an indicator to show the highest number that you have reached, your health care provider or  respiratory therapist will help you set a goal. Keep a log of your progress as told by your health care provider. What are the risks? Breathing too quickly may cause dizziness or cause you to pass out. Take your time so you do not get dizzy or light-headed. If you are in pain, you may need to take pain medicine before doing incentive spirometry. It is harder to take a deep breath if you are having pain. How to use your incentive spirometer  Sit up on the edge of your bed or on a chair. Hold the incentive spirometer so that it is in an upright position. Before you use the spirometer, breathe out normally.  Place the mouthpiece in your mouth. Make sure your lips are closed tightly around it. Breathe in slowly and as deeply as you can through your mouth, causing the piston or the ball to rise toward the top of the chamber. Hold your breath for 3-5 seconds, or for as long as possible. If the spirometer includes a coach indicator, use this to guide you in breathing. Slow down your breathing if the indicator goes above the marked areas. Remove the mouthpiece from your mouth and breathe out normally. The piston or ball will return to the bottom of the chamber. Rest for a few seconds, then repeat the steps 10 or more times. Take your time and take a few normal breaths between deep breaths so that you do not get dizzy or light-headed. Do this every 1-2 hours when you are awake. If the spirometer includes a goal marker to show the highest number you have reached (best effort), use this as a goal to work toward during each repetition. After each set of 10 deep breaths, cough a few times. This will help to make sure that your lungs are clear. If you have an incision on your chest or abdomen from surgery, place a pillow or a rolled-up towel firmly against the incision when you cough. This can help to reduce pain while taking deep breaths and coughing. General tips When you are able to get out of bed: Walk  around often. Continue to take deep breaths and cough in order to clear your lungs. Keep using the incentive spirometer until your health care provider says it is okay to stop using it. If you have been in the hospital, you may be told to keep using the spirometer at home. Contact a health care provider if: You are having difficulty using the spirometer. You have trouble using the spirometer as often as instructed. Your pain medicine is not giving enough relief for you to use the spirometer as told. You have a fever. Get help right away if: You develop shortness of breath. You develop a cough with bloody mucus from the lungs. You have fluid or blood coming from an incision site after you cough. Summary An incentive spirometer is a tool that can help you learn to take long, deep breaths to keep your lungs clear and active. You may be asked to use a spirometer after a surgery, if you have a lung problem or a history of smoking, or if you have been inactive for a long period of time. Use your incentive spirometer as instructed every 1-2 hours while you are awake. If you have an incision on your chest or abdomen, place a pillow or a rolled-up towel firmly against your incision when you cough. This will help to reduce pain. Get help right away if you have shortness of breath, you cough up bloody mucus, or blood comes from your incision when you cough. This information is not intended to replace advice given to you by your health care provider. Make sure you discuss any questions you have with your health care provider. Document Revised: 01/18/2020 Document Reviewed: 01/18/2020 Elsevier Patient Education  2023 [employer] Inc.  Preoperative Educational Videos for Total Hip, Knee and Shoulder Replacements  To better prepare for surgery, please view our videos that explain the physical activity and discharge planning required to have the best surgical recovery at [facility].  [email or web address]  Questions? Call [phone] or email [email or web address]

## 2024-03-10 ENCOUNTER — Other Ambulatory Visit: Payer: Self-pay

## 2024-03-10 ENCOUNTER — Encounter: Payer: Self-pay | Admitting: Surgery

## 2024-03-10 ENCOUNTER — Ambulatory Visit
Admission: RE | Admit: 2024-03-10 | Discharge: 2024-03-10 | Disposition: A | Discharge status: Home / Self Care | Attending: Surgery | Admitting: Surgery

## 2024-03-10 ENCOUNTER — Ambulatory Visit: Payer: Self-pay | Admitting: Urgent Care

## 2024-03-10 ENCOUNTER — Ambulatory Visit: Payer: Self-pay | Admitting: Anesthesiology

## 2024-03-10 ENCOUNTER — Encounter
Admission: RE | Disposition: A | Discharge status: Home / Self Care | Payer: Self-pay | Source: Home / Self Care | Attending: Surgery

## 2024-03-10 ENCOUNTER — Ambulatory Visit

## 2024-03-10 DIAGNOSIS — L409 Psoriasis, unspecified: Secondary | ICD-10-CM

## 2024-03-10 DIAGNOSIS — R519 Headache, unspecified: Secondary | ICD-10-CM | POA: Insufficient documentation

## 2024-03-10 DIAGNOSIS — E669 Obesity, unspecified: Secondary | ICD-10-CM | POA: Diagnosis not present

## 2024-03-10 DIAGNOSIS — D649 Anemia, unspecified: Secondary | ICD-10-CM | POA: Diagnosis not present

## 2024-03-10 DIAGNOSIS — R21 Rash and other nonspecific skin eruption: Secondary | ICD-10-CM

## 2024-03-10 DIAGNOSIS — G35 Multiple sclerosis: Secondary | ICD-10-CM | POA: Insufficient documentation

## 2024-03-10 DIAGNOSIS — M1711 Unilateral primary osteoarthritis, right knee: Secondary | ICD-10-CM | POA: Insufficient documentation

## 2024-03-10 DIAGNOSIS — Z791 Long term (current) use of non-steroidal anti-inflammatories (NSAID): Secondary | ICD-10-CM | POA: Insufficient documentation

## 2024-03-10 DIAGNOSIS — Z87891 Personal history of nicotine dependence: Secondary | ICD-10-CM | POA: Diagnosis not present

## 2024-03-10 DIAGNOSIS — I10 Essential (primary) hypertension: Secondary | ICD-10-CM | POA: Diagnosis not present

## 2024-03-10 DIAGNOSIS — Z79899 Other long term (current) drug therapy: Secondary | ICD-10-CM | POA: Insufficient documentation

## 2024-03-10 SURGERY — ARTHROPLASTY, KNEE, TOTAL
Anesthesia: Spinal | Site: Knee | Laterality: Right

## 2024-03-10 MED ORDER — PHENYLEPHRINE 80 MCG/ML (10ML) SYRINGE FOR IV PUSH (FOR BLOOD PRESSURE SUPPORT)
PREFILLED_SYRINGE | INTRAVENOUS | Status: DC | PRN
  Administered 2024-03-10: 160 ug via INTRAVENOUS
  Administered 2024-03-10 (×3): 80 ug via INTRAVENOUS

## 2024-03-10 MED ORDER — METOCLOPRAMIDE HCL 10 MG PO TABS: 5.0000 mg | ORAL_TABLET | Freq: Three times a day (TID) | ORAL | Status: DC | PRN

## 2024-03-10 MED ORDER — CEFAZOLIN SODIUM-DEXTROSE 2-4 GM/100ML-% IV SOLN
2.0000 g | INTRAVENOUS | Status: AC
  Administered 2024-03-10: 2 g via INTRAVENOUS

## 2024-03-10 MED ORDER — ORAL CARE MOUTH RINSE: 15.0000 mL | Freq: Once | OROMUCOSAL | Status: AC

## 2024-03-10 MED ORDER — MIDAZOLAM HCL 5 MG/5ML IJ SOLN
INTRAMUSCULAR | Status: DC | PRN
  Administered 2024-03-10: 2 mg via INTRAVENOUS

## 2024-03-10 MED ORDER — OXYCODONE HCL 5 MG/5ML PO SOLN: 5.0000 mg | Freq: Once | ORAL | Status: DC | PRN

## 2024-03-10 MED ORDER — BUPIVACAINE-EPINEPHRINE (PF) 0.5% -1:200000 IJ SOLN
INTRAMUSCULAR | Status: AC
  Filled 2024-03-10: qty 30

## 2024-03-10 MED ORDER — OXYCODONE HCL 5 MG PO TABS: 5.0000 mg | ORAL_TABLET | Freq: Once | ORAL | Status: DC | PRN

## 2024-03-10 MED ORDER — TRANEXAMIC ACID-NACL 1000-0.7 MG/100ML-% IV SOLN
1000.0000 mg | INTRAVENOUS | Status: AC
  Administered 2024-03-10: 1000 mg via INTRAVENOUS

## 2024-03-10 MED ORDER — KETOCONAZOLE 2 % EX CREA
1.0000 | TOPICAL_CREAM | Freq: Every day | CUTANEOUS | Status: AC | PRN
Start: 2024-03-10 — End: ?

## 2024-03-10 MED ORDER — CLOBETASOL PROPIONATE 0.05 % EX SOLN: 1.0000 | Freq: Two times a day (BID) | CUTANEOUS | Status: AC | PRN

## 2024-03-10 MED ORDER — SODIUM CHLORIDE 0.9 % IR SOLN
Status: DC | PRN
  Administered 2024-03-10: 3000 mL

## 2024-03-10 MED ORDER — TRANEXAMIC ACID-NACL 1000-0.7 MG/100ML-% IV SOLN
INTRAVENOUS | Status: AC
  Filled 2024-03-10: qty 100

## 2024-03-10 MED ORDER — CEFAZOLIN SODIUM-DEXTROSE 2-4 GM/100ML-% IV SOLN
2.0000 g | Freq: Four times a day (QID) | INTRAVENOUS | Status: DC
  Administered 2024-03-10: 2 g via INTRAVENOUS

## 2024-03-10 MED ORDER — METOCLOPRAMIDE HCL 5 MG/ML IJ SOLN: 5.0000 mg | Freq: Three times a day (TID) | INTRAMUSCULAR | Status: DC | PRN

## 2024-03-10 MED ORDER — BUPIVACAINE LIPOSOME 1.3 % IJ SUSP
INTRAMUSCULAR | Status: AC
  Filled 2024-03-10: qty 20

## 2024-03-10 MED ORDER — FENTANYL CITRATE (PF) 100 MCG/2ML IJ SOLN: 25.0000 ug | INTRAMUSCULAR | Status: DC | PRN

## 2024-03-10 MED ORDER — CEFAZOLIN SODIUM-DEXTROSE 2-4 GM/100ML-% IV SOLN
INTRAVENOUS | Status: AC
  Filled 2024-03-10: qty 100

## 2024-03-10 MED ORDER — PHENYLEPHRINE HCL-NACL 20-0.9 MG/250ML-% IV SOLN
INTRAVENOUS | Status: DC | PRN
Start: 2024-03-10 — End: 2024-03-10
  Administered 2024-03-10: 30 ug/min via INTRAVENOUS

## 2024-03-10 MED ORDER — ONDANSETRON HCL 4 MG/2ML IJ SOLN
4.0000 mg | Freq: Four times a day (QID) | INTRAMUSCULAR | Status: DC | PRN
Start: 2024-03-10 — End: 2024-03-10

## 2024-03-10 MED ORDER — CHLORHEXIDINE GLUCONATE 0.12 % MT SOLN
OROMUCOSAL | Status: AC
  Filled 2024-03-10: qty 15

## 2024-03-10 MED ORDER — SODIUM CHLORIDE 0.9 % IV SOLN: INTRAVENOUS | Status: DC

## 2024-03-10 MED ORDER — LACTATED RINGERS IV SOLN: INTRAVENOUS | Status: DC

## 2024-03-10 MED ORDER — HYDROMORPHONE HCL 2 MG PO TABS: 2.0000 mg | ORAL_TABLET | ORAL | Status: DC | PRN

## 2024-03-10 MED ORDER — STERILE WATER FOR IRRIGATION IR SOLN
Status: DC | PRN
  Administered 2024-03-10: 1000 mL

## 2024-03-10 MED ORDER — APIXABAN 2.5 MG PO TABS: 2.5000 mg | ORAL_TABLET | Freq: Two times a day (BID) | ORAL | 0 refills | Status: AC

## 2024-03-10 MED ORDER — PHENYLEPHRINE HCL-NACL 20-0.9 MG/250ML-% IV SOLN
INTRAVENOUS | Status: AC
Start: 2024-03-10 — End: ?
  Filled 2024-03-10: qty 250

## 2024-03-10 MED ORDER — PROPOFOL 500 MG/50ML IV EMUL
INTRAVENOUS | Status: DC | PRN
  Administered 2024-03-10: 30 ug/kg/min via INTRAVENOUS

## 2024-03-10 MED ORDER — PHENYLEPHRINE 80 MCG/ML (10ML) SYRINGE FOR IV PUSH (FOR BLOOD PRESSURE SUPPORT)
PREFILLED_SYRINGE | INTRAVENOUS | Status: AC
  Filled 2024-03-10: qty 10

## 2024-03-10 MED ORDER — SODIUM CHLORIDE (PF) 0.9 % IJ SOLN
INTRAMUSCULAR | Status: AC
  Filled 2024-03-10: qty 50

## 2024-03-10 MED ORDER — BUPIVACAINE HCL (PF) 0.5 % IJ SOLN
INTRAMUSCULAR | Status: AC
  Filled 2024-03-10: qty 10

## 2024-03-10 MED ORDER — ACETAMINOPHEN 325 MG PO TABS: 325.0000 mg | ORAL_TABLET | Freq: Four times a day (QID) | ORAL | Status: DC | PRN

## 2024-03-10 MED ORDER — ONDANSETRON 4 MG PO TBDP: 4.0000 mg | ORAL_TABLET | Freq: Three times a day (TID) | ORAL | 1 refills | Status: AC | PRN

## 2024-03-10 MED ORDER — HYDROMORPHONE HCL 2 MG PO TABS: 2.0000 mg | ORAL_TABLET | ORAL | 0 refills | Status: AC | PRN

## 2024-03-10 MED ORDER — HYDROCORTISONE 2.5 % EX CREA: 1.0000 | TOPICAL_CREAM | Freq: Every day | CUTANEOUS | Status: AC | PRN

## 2024-03-10 MED ORDER — MIDAZOLAM HCL 2 MG/2ML IJ SOLN
INTRAMUSCULAR | Status: AC
  Filled 2024-03-10: qty 2

## 2024-03-10 MED ORDER — ONDANSETRON HCL 4 MG PO TABS: 4.0000 mg | ORAL_TABLET | Freq: Four times a day (QID) | ORAL | Status: DC | PRN

## 2024-03-10 MED ORDER — SODIUM CHLORIDE 0.9 % BOLUS PEDS
250.0000 mL | Freq: Once | INTRAVENOUS | Status: AC
  Administered 2024-03-10: 250 mL via INTRAVENOUS

## 2024-03-10 MED ORDER — TRIAMCINOLONE ACETONIDE 40 MG/ML IJ SUSP
INTRAMUSCULAR | Status: DC | PRN
  Administered 2024-03-10: 93 mL via INTRAMUSCULAR

## 2024-03-10 MED ORDER — TRIAMCINOLONE ACETONIDE 40 MG/ML IJ SUSP
INTRAMUSCULAR | Status: AC
  Filled 2024-03-10: qty 2

## 2024-03-10 MED ORDER — KETOROLAC TROMETHAMINE 15 MG/ML IJ SOLN
15.0000 mg | Freq: Once | INTRAMUSCULAR | Status: AC
  Administered 2024-03-10: 15 mg via INTRAVENOUS

## 2024-03-10 MED ORDER — KETOROLAC TROMETHAMINE 15 MG/ML IJ SOLN
INTRAMUSCULAR | Status: AC
  Filled 2024-03-10: qty 1

## 2024-03-10 MED ORDER — METRONIDAZOLE 0.75 % EX CREA: 1.0000 | TOPICAL_CREAM | Freq: Every evening | CUTANEOUS | Status: AC | PRN

## 2024-03-10 MED ORDER — CHLORHEXIDINE GLUCONATE 0.12 % MT SOLN
15.0000 mL | Freq: Once | OROMUCOSAL | Status: AC
  Administered 2024-03-10: 15 mL via OROMUCOSAL

## 2024-03-10 MED ORDER — BUPIVACAINE HCL (PF) 0.5 % IJ SOLN
INTRAMUSCULAR | Status: DC | PRN
  Administered 2024-03-10: 2.8 mL

## 2024-03-10 MED ORDER — PROPOFOL 1000 MG/100ML IV EMUL
INTRAVENOUS | Status: AC
  Filled 2024-03-10: qty 100

## 2024-03-10 SURGICAL SUPPLY — 54 items
BLADE SAW 90X13X1.19 OSCILLAT (BLADE) ×1 IMPLANT
BLADE SAW SAG 25X90X1.19 (BLADE) ×1 IMPLANT
BLADE SURG SZ20 CARB STEEL (BLADE) ×1 IMPLANT
BNDG ELASTIC 6X5.8 VLCR NS LF (GAUZE/BANDAGES/DRESSINGS) ×1 IMPLANT
CEMENT BONE R 1X40 (Cement) ×2 IMPLANT
CEMENT VACUUM MIXING SYSTEM (MISCELLANEOUS) ×1 IMPLANT
CHLORAPREP W/TINT 26 (MISCELLANEOUS) ×1 IMPLANT
COMPONENT FEM CMT PERS NRW 6RT (Joint) IMPLANT
COMPONENT PAT3 PEG SERS 31/6.2 (Miscellaneous) IMPLANT
COMPONET TIB PSN SZ C 0 DEG RT (Joint) IMPLANT
COOLER POLAR GLACIER W/PUMP (MISCELLANEOUS) ×1 IMPLANT
COVER MAYO STAND STRL (DRAPES) ×1 IMPLANT
CUFF TRNQT CYL 24X4X16.5-23 (TOURNIQUET CUFF) IMPLANT
CUFF TRNQT CYL 30X4X21-28X (TOURNIQUET CUFF) IMPLANT
CUFF TRNQT CYL 34X4.125X (TOURNIQUET CUFF) IMPLANT
DRAPE IMP U-DRAPE 54X76 (DRAPES) ×1 IMPLANT
DRAPE SHEET LG 3/4 BI-LAMINATE (DRAPES) ×1 IMPLANT
DRAPE U-SHAPE 47X51 STRL (DRAPES) ×1 IMPLANT
DRSG AQUACEL AG ADV 3.5X10 (GAUZE/BANDAGES/DRESSINGS) IMPLANT
DRSG MEPILEX SACRM 8.7X9.8 (GAUZE/BANDAGES/DRESSINGS) IMPLANT
DRSG OPSITE POSTOP 4X10 (GAUZE/BANDAGES/DRESSINGS) ×1 IMPLANT
DRSG OPSITE POSTOP 4X8 (GAUZE/BANDAGES/DRESSINGS) ×1 IMPLANT
ELECT CAUTERY BLADE 6.4 (BLADE) ×1 IMPLANT
ELECTRODE REM PT RTRN 9FT ADLT (ELECTROSURGICAL) ×1 IMPLANT
GAUZE XEROFORM 1X8 LF (GAUZE/BANDAGES/DRESSINGS) ×1 IMPLANT
GLOVE BIO SURGEON STRL SZ7.5 (GLOVE) ×4 IMPLANT
GLOVE BIO SURGEON STRL SZ8 (GLOVE) ×4 IMPLANT
GLOVE BIOGEL PI IND STRL 8 (GLOVE) ×1 IMPLANT
GLOVE INDICATOR 8.0 STRL GRN (GLOVE) ×1 IMPLANT
GOWN STRL REUS W/ TWL LRG LVL3 (GOWN DISPOSABLE) ×1 IMPLANT
GOWN STRL REUS W/ TWL XL LVL3 (GOWN DISPOSABLE) ×1 IMPLANT
HOOD PEEL AWAY T7 (MISCELLANEOUS) ×3 IMPLANT
INSERT TIB ASF PERS CD6-7 RT (Insert) IMPLANT
KIT TURNOVER KIT A (KITS) ×1 IMPLANT
MANIFOLD NEPTUNE II (INSTRUMENTS) ×1 IMPLANT
NDL SPNL 20GX3.5 QUINCKE YW (NEEDLE) ×1 IMPLANT
NEEDLE SPNL 20GX3.5 QUINCKE YW (NEEDLE) ×1 IMPLANT
PACK TOTAL KNEE (MISCELLANEOUS) ×1 IMPLANT
PAD WRAPON POLAR KNEE (MISCELLANEOUS) ×1 IMPLANT
PENCIL SMOKE EVACUATOR (MISCELLANEOUS) ×1 IMPLANT
PIN DRILL HDLS TROCAR 75 4PK (PIN) IMPLANT
SCREW FEMALE HEX FIX 25X2.5 (ORTHOPEDIC DISPOSABLE SUPPLIES) IMPLANT
SOL .9 NS 3000ML IRR UROMATIC (IV SOLUTION) ×1 IMPLANT
STAPLER SKIN PROX 35W (STAPLE) ×1 IMPLANT
STOCKINETTE IMPERV 14X48 (MISCELLANEOUS) ×1 IMPLANT
SUCTION TUBE FRAZIER 10FR DISP (SUCTIONS) ×1 IMPLANT
SUT VIC AB 0 CT1 36 (SUTURE) ×3 IMPLANT
SUT VIC AB 2-0 CT1 TAPERPNT 27 (SUTURE) ×3 IMPLANT
SYR 10ML LL (SYRINGE) ×1 IMPLANT
SYR 20ML LL LF (SYRINGE) ×1 IMPLANT
SYR 30ML LL (SYRINGE) IMPLANT
TIP FAN IRRIG PULSAVAC PLUS (DISPOSABLE) ×1 IMPLANT
TRAP FLUID SMOKE EVACUATOR (MISCELLANEOUS) ×1 IMPLANT
WATER STERILE IRR 500ML POUR (IV SOLUTION) ×1 IMPLANT

## 2024-03-10 NOTE — TOC Progression Note (Signed)
 Transition of Care [facility]) - Progression Note    Patient Details  Name: Stephanie Shah MRN: [id number] Date of Birth: 1956/08/01  [facility] CM/SW Contact  Alexandra Ice, RN Phone Number: 03/10/2024, 12:06 PM  Clinical Narrative: Received message from bedside nurse patient needs BSC. Sent to Jon with [employer] for processing.        Expected Discharge Plan and Services         Expected Discharge Date: 03/10/24                                     Social Determinants of Health (SDOH) Interventions SDOH Screenings   Food Insecurity: No Food Insecurity (06/25/2023)   Received from [facility]  Housing: Unknown (12/04/2023)   Received from [facility]  Transportation Needs: No Transportation Needs (06/25/2023)   Received from [facility]  Utilities: Not At Risk (06/25/2023)   Received from [facility]  Alcohol Screen: Low Risk  (03/13/2022)  Depression (PHQ2-9): Low Risk  (03/13/2022)  Financial Resource Strain: Low Risk  (06/25/2023)   Received from [facility]  Physical Activity: Inactive (03/03/2018)  Tobacco Use: Medium Risk (03/10/2024)    Readmission Risk Interventions     No data to display

## 2024-03-10 NOTE — Op Note (Signed)
 03/10/2024  9:52 AM  Patient:   Stephanie Shah  Pre-Op Diagnosis:   Degenerative joint disease, right knee.  Post-Op Diagnosis:   Same  Procedure:   Right TKA using all-cemented Zimmer Persona system with a #6 narrow PCR femur, a(n) C-sized  tibial tray with an 11 mm medial congruent E-poly insert, and a 31 x 6.2 mm Vanguard all-poly 3-pegged domed patella.  Surgeon:   Lonnie Roberts, MD  Assistant:   Edie Goon, PA-C; Russell Court, PA-S  Anesthesia:   Spinal  Findings:   As above  Complications:   None  EBL:   5 cc  Fluids:   600 cc crystalloid  UOP:   None  TT:   85 minutes at 300 mmHg  Drains:   None  Closure:   Staples  Implants:   As above  Brief Clinical Note:   The patient is a 68 year old female with a long history of progressively worsening right knee pain. The patient's symptoms have progressed despite medications, activity modification, injections, etc. The patient's history and examination were consistent with advanced degenerative joint disease of the right knee confirmed by plain radiographs. The patient presents at this time for a right total knee arthroplasty.  Procedure:   The patient was brought into the operating room. After adequate spinal anesthesia was obtained, the patient was repositioned in the supine position on the operating room table. The right lower extremity was prepped with ChloraPrep solution and draped sterilely. Preoperative antibiotics were administered. A timeout was performed to verify the appropriate surgical site before the limb was exsanguinated with an Esmarch and the tourniquet inflated to 300 mmHg.   A standard anterior approach to the knee was made through an approximately 6-7 inch incision. The incision was carried down through the subcutaneous tissues to expose superficial retinaculum. This was split the length of the incision and the medial flap elevated sufficiently to expose the medial retinaculum. The medial retinaculum  was incised, leaving a 3-4 mm cuff of tissue on the patella. This was extended distally along the medial border of the patellar tendon and proximally through the medial third of the quadriceps tendon. A subtotal fat pad excision was performed before the soft tissues were elevated off the anteromedial and anterolateral aspects of the proximal tibia to the level of the collateral ligaments. The anterior portions of the medial and lateral menisci were removed, as was the anterior cruciate ligament. With the knee flexed to 90, the external tibial guide was positioned and the appropriate proximal tibial cut made. This piece was taken to the back table where it was measured and found to be optimally replicated by a(n) C-sized component.  Attention was directed to the distal femur. The intramedullary canal was accessed through a 3/8" drill hole. The intramedullary guide was inserted and placed at 5 of valgus alignment. Using the +0 slot, the distal cut was made. The distal femur was measured and found to be optimally replicated by the #6 component. The #6 4-in-1 cutting block was positioned and first the posterior, then the posterior chamfer, the anterior, and finally the anterior chamfer cuts were made after verifying that the anterior cortex would not be notched.   At this point, the posterior portions medial and lateral menisci were removed. A trial reduction was performed using the appropriate femoral and tibial components with first the 10 mm and then the  11 mm  insert. The 11 mm insert demonstrated excellent stability to varus and valgus stressing both in flexion  and extension while permitting full extension. Patellar tracking was assessed and found to be excellent. Therefore, the tibial trial position was marked on the proximal tibia. The patella thickness was measured and found to be 18 mm. Therefore, the appropriate cut was made. The patellar surface was measured and found to be optimally replicated by the   31  mm Vanguard component. The three peg holes were drilled in place before the trial button was inserted. Patella tracking was assessed and found to be excellent, passing the "no thumb test". The lug holes were drilled into the distal femur before the trial component was removed.  The tibial tray was repositioned before the keel was created using the appropriate tower, reamer, and punch.  The bony surfaces were prepared for cementing by irrigating them thoroughly with sterile saline solution via the jet lavage system. A bone plug was fashioned from some of the bone that had been removed previously and used to plug the distal femoral canal. In addition, a "cocktail" of 20 cc of Exparel , 30 cc of 0.5% Sensorcaine , 2 cc of Kenalog 40 (80 mg), and 30 mg of Toradol  diluted out to 90 cc with normal saline was injected into the postero-medial and postero-lateral aspects of the knee, the medial and lateral gutter regions, and the peri-incisional tissues to help with postoperative analgesia. Meanwhile, the cement was being mixed on the back table.   When the cement was ready, the tibial tray was cemented in first. The excess cement was removed using Personal assistant. Next, the femoral component was impacted into place. Again, the excess cement was removed using Personal assistant. The  11 mm  trial insert was positioned and the knee brought into extension while the cement hardened. Finally, the patella was cemented into place and secured using the patellar clamp. Again, the excess cement was removed using Personal assistant. Once the cement had hardened, the knee was placed through a range of motion with the findings as described above. Therefore, the trial insert was removed and, after verifying that no cement had been retained posteriorly, the permanent 11 mm medial congruent E-polyethylene insert was snapped into place with care taken to ensure appropriate locking of the insert. Again the knee was placed through a range of  motion with the findings as described above.  The wound was copiously irrigated with sterile saline solution using the jet lavage system before the quadriceps tendon and retinacular layer were reapproximated using #0 Vicryl interrupted sutures. The superficial retinacular layer also was closed using a running #0 Vicryl suture. The subcutaneous tissues were closed in several layers using 2-0 Vicryl interrupted sutures. The skin was closed using staples. A sterile honeycomb dressing was applied to the skin before the leg was wrapped with an Ace wrap to accommodate the Polar Care device. The patient was then awakened and returned to the recovery room in satisfactory condition after tolerating the procedure well.

## 2024-03-10 NOTE — Transfer of Care (Signed)
 Immediate Anesthesia Transfer of Care Note  Patient: Stephanie Shah  Procedure(s) Performed: ARTHROPLASTY, KNEE, TOTAL (Right: Knee)  Patient Location: PACU  Anesthesia Type:General  Level of Consciousness: awake  Airway & Oxygen Therapy: Patient Spontanous Breathing  Post-op Assessment: Report given to RN and Post -op Vital signs reviewed and stable  Post vital signs: Reviewed and stable  Last Vitals:  Vitals Value Taken Time  BP    Temp    Pulse 93 03/10/24 1000  Resp 16 03/10/24 1000  SpO2 97 % 03/10/24 1000  Vitals shown include unfiled device data.  Last Pain:  Vitals:   03/10/24 0616  TempSrc: Temporal  PainSc: 2       Patients Stated Pain Goal: 0 (03/10/24 1610)  Complications: There were no known notable events for this encounter.

## 2024-03-10 NOTE — TOC Transition Note (Signed)
 Transition of Care [facility]) - Discharge Note   Patient Details  Name: Stephanie Shah MRN: [id number] Date of Birth: 1956-07-01  Transition of Care [facility]) CM/SW Contact:  Alexandra Ice, RN Phone Number: 03/10/2024, 12:25 PM   Clinical Narrative:     Patient to discharge today, home with home health services. Patient needs BSC, sent to [employer] for processing. Patient has [facility], set up by surgeon office prior to surgery. Family to transport home at discharge. Final next level of care: Home w Home Health Services Barriers to Discharge: Barriers Resolved   Patient Goals and CMS Choice Patient states their goals for this hospitalization and ongoing recovery are:: get better          Discharge Placement                  Name of family member notified: Mont Antis Patient and family notified of of transfer: 03/10/24  Discharge Plan and Services Additional resources added to the After Visit Summary for                  DME Arranged: Bedside commode DME Agency: [employer] Date DME Agency Contacted: 03/10/24 Time DME Agency Contacted: 1224 Representative spoke with at DME Agency: Sam Creighton HH Arranged: PT, OT [facility] Agency: [facility] Date [facility] Agency Contacted: 03/10/24 Time HH Agency Contacted: 1225 Representative spoke with at [facility] Agency: [place]   Social Drivers of Health (SDOH) Interventions SDOH Screenings   Food Insecurity: No Food Insecurity (06/25/2023)   Received from [facility]  Housing: Unknown (12/04/2023)   Received from [facility]  Transportation Needs: No Transportation Needs (06/25/2023)   Received from [facility]  Utilities: Not At Risk (06/25/2023)   Received from [facility]  Alcohol Screen: Low Risk  (03/13/2022)  Depression (PHQ2-9): Low Risk  (03/13/2022)  Financial Resource Strain: Low Risk  (06/25/2023)   Received from [facility]  Physical Activity:  Inactive (03/03/2018)  Tobacco Use: Medium Risk (03/10/2024)     Readmission Risk Interventions     No data to display

## 2024-03-10 NOTE — H&P (Signed)
 History of Present Illness:  Stephanie Shah is a 68 y.o. female who presents today for her surgical history and physical for upcoming right total knee arthroplasty scheduled with Dr. Daun Epstein on 03/10/2024. The patient denies any trauma or injury affecting the right knee since her last evaluation. She denies any numbness or ting to the right lower extremity. She denies any changes in the ongoing right knee pain, she does report a 5 out of 10 pain score. She is taking Tylenol  as needed for discomfort. She is taking Celebrex as needed for discomfort at this time. The patient denies any personal history of heart attack, stroke, asthma or COPD. The patient is not a diabetic. The patient does not have any history of DVT. Did suffer some blistering involving the knee incision following her last visit. It also did require several different pain medications to find the exact one needed for adequate pain control.  Current Outpatient Medications:  celecoxib (CELEBREX) 200 MG capsule Take 1 capsule (200 mg total) by mouth 2 (two) times daily 60 capsule 0  acetaminophen  (TYLENOL ) 500 MG tablet Take 1,000 mg by mouth every 6 (six) hours as needed  amLODIPine (NORVASC) 5 MG tablet Take 1 tablet by mouth once daily 100 tablet 3  atorvastatin  (LIPITOR) 20 MG tablet Take 1 tablet (20 mg total) by mouth once daily 100 tablet 2  butalbital -acetaminophen -caffeine  (FIORICET) 50-325-40 mg tablet TAKE 1 TABLET BY MOUTH EVERY 6 HOURS AS NEEDED FOR HEADACHE 30 tablet 0  chlorhexidine  (PERIDEX ) 0.12 % solution Swish and spit 15 mLs 2 (two) times daily  clindamycin (CLEOCIN) 150 MG capsule Take 600 mg by mouth  clobetasoL  (CORMAX ) 0.05 % external solution Apply topically 2 (two) times daily  FLUoxetine  (PROZAC ) 40 MG capsule Take 1 capsule (40 mg total) by mouth once daily 90 capsule 3  folic acid (FOLVITE) 1 MG tablet Take 1 tablet (1 mg total) by mouth once daily 90 tablet 3  hydrocortisone  2.5 % cream Apply topically Apply  topically as directed. Apply to aa seborrheic dermatitis face and intertrigo groin 3 nights a week Tuesday, Thursday, Saturday  ibuprofen  (MOTRIN ) 600 MG tablet Take 600 mg by mouth every 6 (six) hours as needed  ketoconazole  (NIZORAL ) 2 % cream Apply topically once daily  lisinopriL -hydroCHLOROthiazide  (ZESTORETIC) 20-12.5 mg tablet Take 1 tablet by mouth once daily 100 tablet 3  methotrexate (RHEUMATREX) 2.5 MG tablet Take 4 tablets (10 mg total) by mouth every 7 (seven) days 48 tablet 1  metroNIDAZOLE  (METROCREAM ) 0.75 % cream Apply topically 2 (two) times daily  multivitamin tablet Take 1 tablet by mouth once daily   Allergies:  Penicillins Rash  Adhesive Tape-Silicones Rash (Localized to where tape touched)   Past Medical History:  Allergy  Penicillin  Arthritis  Osteo  Chicken pox  Hyperlipidemia  Hypertension  Multiple sclerosis (CMS/HHS-HCC)  Shingles   Past Surgical History:  ARTHROSCOPIC SUBACROMIAL DECOMPRESSION, ROTATOR CUFF REPAIR, BICEPS TENODESIS CLAVICLE RESECTION 08/18/2013 (Dr. Phyllis Breeze)  DILATION AND CURETTAGE, DIAGNOSTIC / THERAPEUTIC 2019  COLONOSCOPY 07/07/2021 (Non-bleeding internal hemorrhoids, Diverticulosis, otherwise normal. Repeat 10 YRS/ TKT)  Left TKA using all-cemented [employer] Vanguard system with a 60 mm PCR femur, a 63 mm tibial tray with a 10 mm anterior stabilized E-poly insert, and a 31 x 6.2 mm all-poly 3-pegged domed patella Left 03/20/2022 (Dr.Hector Taft)  ADENOIDECTOMY  CESAREAN SECTION  KNEE ARTHROSCOPY May 2023  TONSILLECTOMY  TUBAL LIGATION 1995  WISDOM TEETH   Family History:   Pancreatic cancer Mother Mom  Alzheimer's disease Mother  Mom  Hyperlipidemia (Elevated cholesterol) Mother Mom  High blood pressure (Hypertension) Mother Mom  Prostate cancer Mother Mom  Heart disease Father  Psoriasis Father  High blood pressure (Hypertension) Sister Thersia Flax  Diabetes type II Maternal Aunt Gladys  High blood pressure (Hypertension) Maternal  Aunt Laverne   Social History:   Socioeconomic History:  Marital status: Married  Occupational History  Occupation: Retired  Tobacco Use  Smoking status: Former  Current packs/day: 0.00  Average packs/day: 1 pack/day for 20.0 years (20.0 ttl pk-yrs)  Types: Cigarettes  Start date: 08/12/1984  Quit date: 08/12/2004  Years since quitting: 19.5  Smokeless tobacco: Never  Vaping Use  Vaping status: Never Used  Substance and Sexual Activity  Alcohol use: Yes  Alcohol/week: 7.0 standard drinks of alcohol  Types: 7 Standard drinks or equivalent per week  Comment: Patient states she has quit drinking:12.4.23  Drug use: Never  Sexual activity: Yes  Partners: Male  Birth control/protection: Post-menopausal   Social Drivers of Health:   Physicist, medical Strain: Low Risk (06/25/2023)  Overall Financial Resource Strain (CARDIA)  Difficulty of Paying Living Expenses: Not hard at all  Food Insecurity: No Food Insecurity (06/25/2023)  Hunger Vital Sign  Worried About Running Out of Food in the Last Year: Never true  Ran Out of Food in the Last Year: Never true  Transportation Needs: No Transportation Needs (06/25/2023)  PRAPARE - Risk analyst (Medical): No  Lack of Transportation (Non-Medical): No   Review of Systems:  A comprehensive 14 point ROS was performed, reviewed, and the pertinent orthopaedic findings are documented in the HPI.  Physical Exam: BP 138/88  Ht 154.9 cm (5\' 1" )  Wt 88.9 kg (196 lb)  BMI 37.03 kg/m  General/Constitutional: The patient appears to be well-nourished, well-developed, and in no acute distress. Neuro/Psych: Normal mood and affect, oriented to person, place and time. Eyes: Non-icteric. Pupils are equal, round, and reactive to light, and exhibit synchronous movement. ENT: Unremarkable. Lymphatic: No palpable adenopathy. Respiratory: Lungs clear to auscultation, Normal chest excursion, No wheezes, and Non-labored  breathing Cardiovascular: Regular rate and rhythm. No murmurs. and No edema, swelling or tenderness, except as noted in detailed exam. Integumentary: No impressive skin lesions present, except as noted in detailed exam. Musculoskeletal: Unremarkable, except as noted in detailed exam.  Right knee exam: ALIGNMENT: Normal SKIN: Unremarkable SWELLING: Minimal EFFUSION: Trace WARMTH: None TENDERNESS: Moderate tenderness with palpation to the medial joint line in addition to the lateral joint line. ROM: 5-120 degrees with mild pain at maximal flexion McMURRAY'S: Equivocally positive PATELLOFEMORAL: Normal tracking with no peri-patellar tenderness and negative apprehension sign CREPITUS: None LACHMAN'S: Negative PIVOT SHIFT: Negative ANTERIOR DRAWER: Negative POSTERIOR DRAWER: Negative VARUS/VALGUS: Stable  She remains neurovascularly intact to the right lower extremity and foot.  Imaging: AP, lateral and sunrise views of the right knee were obtained at her previous appointment and reviewed by me today. These x-rays demonstrate severe osteoarthritic changes primarily involving the medial compartment of the right knee with bone-on-bone appearance. There is some osteophyte formation along the lateral compartment of the tibial plateau. On lateral view there is osteophyte formation off the superior and inferior aspect of the patella. Moderate loss of patellofemoral joint space noted on sunrise view.  Assessment: 1. Primary osteoarthritis of right knee.  2. S/P TKR (total knee replacement) using cement, left   Plan: 1. Treatment options were discussed today with the patient. 2. The patient is scheduled for a right total knee arthroplasty with Dr. Daun Epstein  on 03/10/2024. 3. After a discussion of the risk and benefits the patient would like to proceed with a right total knee arthroplasty to be performed with Dr. Daun Epstein in the future. 4. This document will serve as a surgical history and physical for  the patient. 5. The patient will follow-up per standard postop protocol at this time. She can call the clinic if she has any questions, new symptoms well or symptoms worsen.  The procedure was discussed with the patient, as were the potential risks (including bleeding, infection, nerve and/or blood vessel injury, persistent or recurrent pain, failure of the hardware, need for further surgery, blood clots, strokes, heart attacks and/or arhythmias, pneumonia, etc.) and benefits. The patient states her understanding and wishes to proceed.    H&P reviewed and patient re-examined. No changes.

## 2024-03-10 NOTE — Evaluation (Signed)
 Physical Therapy Evaluation Patient Details Name: Stephanie Shah MRN: [id number] DOB: 29-Oct-1956 Today's Date: 03/10/2024  History of Present Illness  Pt is s/p R TKA on 03/10/24  Clinical Impression  Pt admitted with above diagnosis. Pt currently with functional limitations due to the deficits listed below (see PT Problem List). Pt received upright in recliner agreeable to PT services. Pt familiar to author from prior L TKA. Reports being independent at baseline.   To date, pt reports understanding of knee positioning, polar care, etc. She is able to stand at supervision with VC's for hand placement completing > 200' of gait. Variable gait speeds noted requiring VC's for slowed gait speed and initiating step through gait with consistent RLE heel strike with good carryover. X2 stair efforts performed at [facility] with PT then with pt and spouse with backwards technique with RW with safe completion and correct LE sequencing. Pt returned to recliner and performed HEP with education on reps/sets/frequency. Pt and spouse understanding of education. Pt safe for SDDC.      If plan is discharge home, recommend the following: A little help with walking and/or transfers;A little help with bathing/dressing/bathroom;Assistance with cooking/housework;Assist for transportation;Help with stairs or ramp for entrance   Can travel by private vehicle        Equipment Recommendations BSC/3in1  Recommendations for Other Services       Functional Status Assessment Patient has had a recent decline in their functional status and demonstrates the ability to make significant improvements in function in a reasonable and predictable amount of time.     Precautions / Restrictions Precautions Precautions: Knee Precaution Booklet Issued: Yes (comment) Recall of Precautions/Restrictions: Intact Restrictions Weight Bearing Restrictions Per Provider Order: Yes RLE Weight Bearing Per Provider Order: Weight bearing as  tolerated      Mobility  Bed Mobility                 Patient Response: Cooperative  Transfers Overall transfer level: Needs assistance Equipment used: Rolling walker (2 wheels) Transfers: Sit to/from Stand Sit to Stand: Supervision           General transfer comment: VC's for hand placement    Ambulation/Gait Ambulation/Gait assistance: Supervision Gait Distance (Feet): 200 Feet Assistive device: Rolling walker (2 wheels) Gait Pattern/deviations: Step-to pattern, Decreased stance time - right, Decreased step length - left       General Gait Details: VC's for heel strike on RLE. Brief periods of shuffling gait needing VC's to correct with good carryover.  Stairs Stairs: Yes Stairs assistance: Contact guard assist Stair Management: No rails, Backwards, With walker Number of Stairs: 2 General stair comments: VC's for LE sequencing  Wheelchair Mobility     Tilt Bed Tilt Bed Patient Response: Cooperative  Modified Rankin (Stroke Patients Only)       Balance Overall balance assessment: Needs assistance Sitting-balance support: Feet supported Sitting balance-Leahy Scale: Good       Standing balance-Leahy Scale: Fair Standing balance comment: UE support on RW                             Pertinent Vitals/Pain Pain Assessment Pain Assessment: Faces Faces Pain Scale: Hurts little more Pain Location: R knee Pain Descriptors / Indicators: Aching, Discomfort, Grimacing Pain Intervention(s): Limited activity within patient's tolerance    Home Living Family/patient expects to be discharged to:: Private residence Living Arrangements: Spouse/significant other Available Help at Discharge: Family Type of Home: House Home  Access: Stairs to enter Entrance Stairs-Rails: None Entrance Stairs-Number of Steps: 2 from garage   Home Layout: One level Home Equipment: Agricultural consultant (2 wheels);Toilet riser      Prior Function Prior Level of  Function : Independent/Modified Independent                     Extremity/Trunk Assessment   Upper Extremity Assessment Upper Extremity Assessment: Overall WFL for tasks assessed    Lower Extremity Assessment Lower Extremity Assessment: Generalized weakness;RLE deficits/detail RLE Deficits / Details: R TKA    Cervical / Trunk Assessment Cervical / Trunk Assessment: Normal  Communication   Communication Communication: No apparent difficulties    Cognition Arousal: Alert Behavior During Therapy: WFL for tasks assessed/performed   PT - Cognitive impairments: No apparent impairments                         Following commands: Intact       Cueing Cueing Techniques: Verbal cues     General Comments      Exercises Total Joint Exercises Ankle Circles/Pumps: AROM, Strengthening, Right, Left, 10 reps, Supine Quad Sets: AROM, Strengthening, Right, 10 reps, Supine Short Arc Quad: AROM, Strengthening, Sidelying, 10 reps, Supine Heel Slides: AROM, Strengthening, Right, 10 reps, Supine Hip ABduction/ADduction: AROM, Strengthening, Right, Supine, 10 reps Straight Leg Raises: AROM, Strengthening, Right, 5 reps Long Arc Quad: AROM, Strengthening, Right, 5 reps, Seated Goniometric ROM: 5-79 degrees Other Exercises Other Exercises: WB status, LE positioning for prevention of knee flexor contracture   Assessment/Plan    PT Assessment All further PT needs can be met in the next venue of care  PT Problem List Decreased strength;Decreased mobility;Decreased range of motion;Decreased balance;Pain       PT Treatment Interventions      PT Goals (Current goals can be found in the Care Plan section)  Acute Rehab PT Goals Patient Stated Goal: return home PT Goal Formulation: With patient Time For Goal Achievement: 03/24/24 Potential to Achieve Goals: Fair    Frequency       Co-evaluation               AM-PAC PT "6 Clicks" Mobility  Outcome Measure  Help needed turning from your back to your side while in a flat bed without using bedrails?: A Little Help needed moving from lying on your back to sitting on the side of a flat bed without using bedrails?: A Little Help needed moving to and from a bed to a chair (including a wheelchair)?: A Little Help needed standing up from a chair using your arms (e.g., wheelchair or bedside chair)?: A Little Help needed to walk in hospital room?: A Little Help needed climbing 3-5 steps with a railing? : A Little 6 Click Score: 18    End of Session Equipment Utilized During Treatment: Gait belt Activity Tolerance: Patient tolerated treatment well Patient left: in chair;with nursing/sitter in room Nurse Communication: Mobility status PT Visit Diagnosis: Muscle weakness (generalized) (M62.81);Pain;Difficulty in walking, not elsewhere classified (R26.2) Pain - Right/Left: Right Pain - part of body: Knee    Time: 1610-9604 PT Time Calculation (min) (ACUTE ONLY): 32 min   Charges:   PT Evaluation $PT Eval Low Complexity: 1 Low PT Treatments $Therapeutic Exercise: 8-22 mins PT General Charges $$ ACUTE PT VISIT: 1 Visit         Marc Senior. Fairly IV, PT, DPT Physical Therapist- [place]  [facility]  03/10/2024, 3:40 PM

## 2024-03-10 NOTE — Anesthesia Procedure Notes (Addendum)
 Spinal  Patient location during procedure: OR Start time: 03/10/2024 7:40 AM End time: 03/10/2024 7:51 AM Reason for block: surgical anesthesia Staffing Performed: anesthesiologist  Anesthesiologist: Enrique Harvest, MD Performed by: Juandavid Dallman R, CRNA Authorized by: Enrique Harvest, MD   Preanesthetic Checklist Completed: patient identified, IV checked, site marked, risks and benefits discussed, surgical consent, monitors and equipment checked, pre-op evaluation and timeout performed Spinal Block Patient position: sitting Prep: DuraPrep Patient monitoring: heart rate, cardiac monitor, continuous pulse ox and blood pressure Approach: midline Location: L3-4 Injection technique: single-shot Needle Needle type: Quincke  Needle gauge: 22 G Needle length: 9 cm Assessment Sensory level: T4 Events: CSF return Additional Notes Meticulous sterile technique used throughout (CHG prep, sterile gloves, sterile drape). Negative paresthesia. Negative blood return. Positive free-flowing CSF. Expiration date of kit checked and confirmed. Patient tolerated procedure well, without complications.

## 2024-03-10 NOTE — Discharge Instructions (Addendum)
 Orthopedic discharge instructions: May shower with intact OpSite dressing. Apply ice frequently to knee or use Polar Care. Start Eliquis  1 tablet (2.5 mg) twice daily on Wednesday, 03/11/2024, for 2 weeks, then take aspirin 325 mg (or baby aspirin x 4) twice daily for 4 weeks. Take pain medication as prescribed when needed.  May supplement with ES Tylenol  if necessary. May weight-bear as tolerated on right leg - use walker for balance and support. Follow-up in 10-14 days or as scheduled.

## 2024-03-10 NOTE — Anesthesia Preprocedure Evaluation (Signed)
 Anesthesia Evaluation  Patient identified by MRN, date of birth, ID band Patient awake    Reviewed: Allergy & Precautions, NPO status , Patient's Chart, lab work & pertinent test results  History of Anesthesia Complications Negative for: history of anesthetic complications  Airway Mallampati: III  TM Distance: >3 FB Neck ROM: Full    Dental  (+) Dental Advidsory Given, Chipped Missing molars:   Pulmonary neg pulmonary ROS, former smoker   Pulmonary exam normal breath sounds clear to auscultation       Cardiovascular hypertension, negative cardio ROS Normal cardiovascular exam Rhythm:Regular Rate:Normal  ECG 03/04/22: normal  Myocardial perfusion 03/26/18:  1.Normal left ventricular function  2.Normal wall motion  3.No evidence for scar or ischemia    Neuro/Psych  Headaches negative neurological ROS  negative psych ROS   GI/Hepatic negative GI ROS, Neg liver ROS,,,  Endo/Other  negative endocrine ROS  Obesity   Renal/GU negative Renal ROS     Musculoskeletal  (+) Arthritis ,    Abdominal   Peds  Hematology negative hematology ROS (+) Blood dyscrasia, anemia   Anesthesia Other Findings Past Medical History: No date: Actinic keratoses No date: Anemia No date: Arthritis No date: Headache No date: Hyperlipidemia No date: Hypertension No date: Pre-diabetes No date: Primary osteoarthritis of right knee  Past Surgical History: No date: CESAREAN SECTION No date: COLONOSCOPY No date: COLONOSCOPY W/ BIOPSIES AND POLYPECTOMY 07/22/2018: DILATATION & CURETTAGE/HYSTEROSCOPY WITH MYOSURE; N/A     Comment:  Procedure: DILATATION & CURETTAGE/HYSTEROSCOPY;                Surgeon: Heron Lord, MD;  Location: [facility] ORS;               Service: Gynecology;  Laterality: N/A; No date: ROTATOR CUFF REPAIR; Right No date: TMJ ARTHROPLASTY No date: TONSILLECTOMY 03/20/2022: TOTAL KNEE ARTHROPLASTY; Left      Comment:  Procedure: TOTAL KNEE ARTHROPLASTY;  Surgeon: Elner Hahn, MD;  Location: [facility] ORS;  Service: Orthopedics;                Laterality: Left; No date: TUBAL LIGATION  BMI    Body Mass Index: 35.90 kg/m      Reproductive/Obstetrics negative OB ROS                             Anesthesia Physical Anesthesia Plan  ASA: 2  Anesthesia Plan: General and Spinal   Post-op Pain Management:    Induction: Intravenous  PONV Risk Score and Plan: 3 and Propofol  infusion, TIVA, Treatment may vary due to age or medical condition and Ondansetron   Airway Management Planned: Natural Airway and Nasal Cannula  Additional Equipment:   Intra-op Plan:   Post-operative Plan:   Informed Consent: I have reviewed the patients History and Physical, chart, labs and discussed the procedure including the risks, benefits and alternatives for the proposed anesthesia with the patient or authorized representative who has indicated his/her understanding and acceptance.     Dental Advisory Given  Plan Discussed with: CRNA  Anesthesia Plan Comments: (Plan for spinal and GA with natural airway, LMA/GETA backup.  Patient consented for risks of anesthesia including but not limited to:  - adverse reactions to medications - damage to eyes, teeth, lips or other oral mucosa - nerve damage due to positioning  - sore throat or hoarseness - headache, bleeding,  infection, nerve damage 2/2 spinal - damage to heart, brain, nerves, lungs, other parts of body or loss of life  Informed patient about role of CRNA in peri- and intra-operative care.  Patient voiced understanding.)        Anesthesia Quick Evaluation

## 2024-03-10 NOTE — Congregational Nurse Program (Cosign Needed)
 Patient is not able to walk the distance required to go the bathroom, or he/she is unable to safely negotiate stairs required to access the bathroom.  A 3in1 BSC will alleviate this problem

## 2024-03-11 ENCOUNTER — Encounter: Payer: Self-pay | Admitting: Surgery

## 2024-03-11 NOTE — Anesthesia Postprocedure Evaluation (Signed)
 Anesthesia Post Note  Patient: Stephanie Shah  Procedure(s) Performed: ARTHROPLASTY, KNEE, TOTAL (Right: Knee)  Patient location during evaluation: Nursing Unit Anesthesia Type: Combined General/Spinal Level of consciousness: oriented and awake and alert Pain management: pain level controlled Vital Signs Assessment: post-procedure vital signs reviewed and stable Respiratory status: spontaneous breathing and respiratory function stable Cardiovascular status: blood pressure returned to baseline and stable Postop Assessment: no headache, no backache, no apparent nausea or vomiting and patient able to bend at knees Anesthetic complications: no  There were no known notable events for this encounter.   Last Vitals:  Vitals:   03/10/24 1149 03/10/24 1517  BP: (!) 118/58 (!) 143/73  Pulse: 82 94  Resp: 20 20  Temp: (!) 36.1 C (!) 36.1 C  SpO2: 100% 97%    Last Pain:  Vitals:   03/10/24 1149  TempSrc: Temporal  PainSc: 0-No pain                 Enrique Harvest

## 2024-10-22 ENCOUNTER — Ambulatory Visit: Payer: Medicare Other | Admitting: Dermatology

## 2024-10-27 ENCOUNTER — Other Ambulatory Visit: Payer: Self-pay | Admitting: Internal Medicine

## 2024-10-27 DIAGNOSIS — Z1231 Encounter for screening mammogram for malignant neoplasm of breast: Secondary | ICD-10-CM

## 2024-11-26 ENCOUNTER — Other Ambulatory Visit: Payer: Self-pay | Admitting: Obstetrics and Gynecology

## 2024-11-26 DIAGNOSIS — R29898 Other symptoms and signs involving the musculoskeletal system: Secondary | ICD-10-CM

## 2024-12-02 ENCOUNTER — Ambulatory Visit
Admission: RE | Admit: 2024-12-02 | Discharge: 2024-12-02 | Disposition: A | Discharge status: Home / Self Care | Source: Ambulatory Visit | Attending: Obstetrics and Gynecology | Admitting: Obstetrics and Gynecology

## 2024-12-02 DIAGNOSIS — R29898 Other symptoms and signs involving the musculoskeletal system: Secondary | ICD-10-CM | POA: Insufficient documentation

## 2024-12-10 ENCOUNTER — Ambulatory Visit
Admission: RE | Admit: 2024-12-10 | Discharge: 2024-12-10 | Disposition: A | Discharge status: Home / Self Care | Source: Ambulatory Visit | Attending: Internal Medicine | Admitting: Internal Medicine

## 2024-12-10 DIAGNOSIS — Z1231 Encounter for screening mammogram for malignant neoplasm of breast: Secondary | ICD-10-CM | POA: Diagnosis present
